# Patient Record
Sex: Female | Born: 1945
Health system: Southern US, Community
[De-identification: ages and names within clinical notes are randomized; demographics above are authoritative.]

## PROBLEM LIST (undated history)

## (undated) DIAGNOSIS — J302 Other seasonal allergic rhinitis: Secondary | ICD-10-CM

## (undated) DIAGNOSIS — F329 Major depressive disorder, single episode, unspecified: Secondary | ICD-10-CM

## (undated) DIAGNOSIS — D649 Anemia, unspecified: Secondary | ICD-10-CM

## (undated) DIAGNOSIS — T4145XA Adverse effect of unspecified anesthetic, initial encounter: Secondary | ICD-10-CM

## (undated) DIAGNOSIS — E042 Nontoxic multinodular goiter: Secondary | ICD-10-CM

## (undated) DIAGNOSIS — I341 Nonrheumatic mitral (valve) prolapse: Secondary | ICD-10-CM

## (undated) DIAGNOSIS — D869 Sarcoidosis, unspecified: Secondary | ICD-10-CM

## (undated) DIAGNOSIS — F32A Depression, unspecified: Secondary | ICD-10-CM

## (undated) DIAGNOSIS — J45909 Unspecified asthma, uncomplicated: Secondary | ICD-10-CM

## (undated) DIAGNOSIS — E7800 Pure hypercholesterolemia, unspecified: Secondary | ICD-10-CM

## (undated) DIAGNOSIS — R0600 Dyspnea, unspecified: Secondary | ICD-10-CM

## (undated) DIAGNOSIS — T8859XA Other complications of anesthesia, initial encounter: Secondary | ICD-10-CM

## (undated) DIAGNOSIS — Z8711 Personal history of peptic ulcer disease: Secondary | ICD-10-CM

## (undated) DIAGNOSIS — I499 Cardiac arrhythmia, unspecified: Secondary | ICD-10-CM

## (undated) HISTORY — PX: OTHER SURGICAL HISTORY: SHX169

## (undated) HISTORY — PX: MITRAL VALVE REPLACEMENT: SHX147

## (undated) HISTORY — DX: Sarcoidosis, unspecified: D86.9

## (undated) HISTORY — DX: Pure hypercholesterolemia, unspecified: E78.00

## (undated) HISTORY — PX: ABDOMINAL HYSTERECTOMY: SHX81

## (undated) HISTORY — PX: CHOLECYSTECTOMY: SHX55

---
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
## 1971-02-02 HISTORY — PX: OTHER SURGICAL HISTORY: SHX169

## 2009-12-11 ENCOUNTER — Ambulatory Visit (HOSPITAL_COMMUNITY)
Admission: RE | Admit: 2009-12-11 | Discharge: 2009-12-11 | Payer: Self-pay | Source: Home / Self Care | Admitting: Family Medicine

## 2010-03-20 ENCOUNTER — Other Ambulatory Visit (HOSPITAL_COMMUNITY): Payer: Self-pay | Admitting: Family Medicine

## 2010-03-20 DIAGNOSIS — R52 Pain, unspecified: Secondary | ICD-10-CM

## 2010-03-25 ENCOUNTER — Encounter (HOSPITAL_COMMUNITY): Payer: Self-pay

## 2010-04-01 ENCOUNTER — Other Ambulatory Visit (HOSPITAL_COMMUNITY): Payer: Self-pay | Admitting: Family Medicine

## 2010-04-01 ENCOUNTER — Ambulatory Visit (HOSPITAL_COMMUNITY)
Admission: RE | Admit: 2010-04-01 | Discharge: 2010-04-01 | Disposition: A | Discharge status: Home / Self Care | Payer: Self-pay | Source: Ambulatory Visit | Attending: Family Medicine | Admitting: Family Medicine

## 2010-04-01 DIAGNOSIS — R52 Pain, unspecified: Secondary | ICD-10-CM

## 2010-04-01 DIAGNOSIS — N644 Mastodynia: Secondary | ICD-10-CM | POA: Insufficient documentation

## 2010-04-01 DIAGNOSIS — N6009 Solitary cyst of unspecified breast: Secondary | ICD-10-CM | POA: Insufficient documentation

## 2010-04-01 DIAGNOSIS — D1739 Benign lipomatous neoplasm of skin and subcutaneous tissue of other sites: Secondary | ICD-10-CM | POA: Insufficient documentation

## 2011-03-25 ENCOUNTER — Other Ambulatory Visit (HOSPITAL_COMMUNITY): Payer: Self-pay | Admitting: Family Medicine

## 2011-03-25 DIAGNOSIS — Z139 Encounter for screening, unspecified: Secondary | ICD-10-CM

## 2011-04-06 ENCOUNTER — Ambulatory Visit (HOSPITAL_COMMUNITY)
Admission: RE | Admit: 2011-04-06 | Discharge: 2011-04-06 | Disposition: A | Discharge status: Home / Self Care | Payer: Medicare Other | Source: Ambulatory Visit | Attending: Family Medicine | Admitting: Family Medicine

## 2011-04-06 DIAGNOSIS — Z1231 Encounter for screening mammogram for malignant neoplasm of breast: Secondary | ICD-10-CM | POA: Insufficient documentation

## 2011-04-06 DIAGNOSIS — Z139 Encounter for screening, unspecified: Secondary | ICD-10-CM

## 2011-07-15 ENCOUNTER — Other Ambulatory Visit (HOSPITAL_COMMUNITY): Payer: Self-pay | Admitting: Family Medicine

## 2011-07-15 DIAGNOSIS — E041 Nontoxic single thyroid nodule: Secondary | ICD-10-CM

## 2011-07-21 ENCOUNTER — Ambulatory Visit (HOSPITAL_COMMUNITY)
Admission: RE | Admit: 2011-07-21 | Discharge: 2011-07-21 | Disposition: A | Discharge status: Home / Self Care | Payer: Medicare Other | Source: Ambulatory Visit | Attending: Family Medicine | Admitting: Family Medicine

## 2011-07-21 DIAGNOSIS — E049 Nontoxic goiter, unspecified: Secondary | ICD-10-CM | POA: Insufficient documentation

## 2011-07-21 DIAGNOSIS — E041 Nontoxic single thyroid nodule: Secondary | ICD-10-CM

## 2011-08-10 ENCOUNTER — Other Ambulatory Visit (HOSPITAL_COMMUNITY): Payer: Self-pay | Admitting: Family Medicine

## 2011-08-10 DIAGNOSIS — D449 Neoplasm of uncertain behavior of unspecified endocrine gland: Secondary | ICD-10-CM

## 2011-08-12 ENCOUNTER — Ambulatory Visit (HOSPITAL_COMMUNITY)
Admission: RE | Admit: 2011-08-12 | Discharge: 2011-08-12 | Disposition: A | Discharge status: Home / Self Care | Payer: Medicare Other | Source: Ambulatory Visit | Attending: Family Medicine | Admitting: Family Medicine

## 2011-08-12 ENCOUNTER — Other Ambulatory Visit (HOSPITAL_COMMUNITY): Payer: Self-pay | Admitting: Family Medicine

## 2011-08-12 ENCOUNTER — Inpatient Hospital Stay (HOSPITAL_COMMUNITY): Admission: RE | Admit: 2011-08-12 | Discharge: 2011-08-12 | Payer: Medicare Other | Source: Ambulatory Visit

## 2011-08-12 DIAGNOSIS — D449 Neoplasm of uncertain behavior of unspecified endocrine gland: Secondary | ICD-10-CM

## 2011-08-12 DIAGNOSIS — E049 Nontoxic goiter, unspecified: Secondary | ICD-10-CM | POA: Insufficient documentation

## 2011-08-12 NOTE — Progress Notes (Signed)
Pt denies taking any anticoagulants.

## 2011-08-12 NOTE — Procedures (Signed)
PreOperative Dx: LEFT thyroid nodule Postoperative Dx: LEFT thyroid nodule Procedure:   US guided FNA LEFT thyroid nodule Radiologist:  Tyron Russell Anesthesia:  1.5 ml of 2% lidocaine Specimen:  FNA x four EBL:   < 1 ml Complications: None

## 2011-08-12 NOTE — Progress Notes (Signed)
1135 Procedure Left Thyroid Biopsy started.  Lidocaine 2%  2 cc injected into left neck (thyroid).  Four biopsies obtained. Tolerated procedure well.  Dressing applied to left thyroid area with pressure applied.

## 2012-01-13 ENCOUNTER — Ambulatory Visit: Payer: Medicare Other | Admitting: Urgent Care

## 2012-01-19 ENCOUNTER — Ambulatory Visit: Payer: Medicare Other | Admitting: Urgent Care

## 2012-05-29 ENCOUNTER — Other Ambulatory Visit (HOSPITAL_COMMUNITY): Payer: Self-pay | Admitting: Family Medicine

## 2012-05-29 DIAGNOSIS — Z139 Encounter for screening, unspecified: Secondary | ICD-10-CM

## 2012-06-01 ENCOUNTER — Ambulatory Visit (HOSPITAL_COMMUNITY)
Admission: RE | Admit: 2012-06-01 | Discharge: 2012-06-01 | Disposition: A | Discharge status: Home / Self Care | Payer: Medicare Other | Source: Ambulatory Visit | Attending: Family Medicine | Admitting: Family Medicine

## 2012-06-01 DIAGNOSIS — Z1231 Encounter for screening mammogram for malignant neoplasm of breast: Secondary | ICD-10-CM | POA: Insufficient documentation

## 2012-06-01 DIAGNOSIS — Z139 Encounter for screening, unspecified: Secondary | ICD-10-CM

## 2013-12-04 ENCOUNTER — Other Ambulatory Visit (HOSPITAL_COMMUNITY): Payer: Self-pay | Admitting: Family

## 2013-12-04 DIAGNOSIS — Z1231 Encounter for screening mammogram for malignant neoplasm of breast: Secondary | ICD-10-CM

## 2013-12-12 ENCOUNTER — Ambulatory Visit (HOSPITAL_COMMUNITY)
Admission: RE | Admit: 2013-12-12 | Discharge: 2013-12-12 | Disposition: A | Discharge status: Home / Self Care | Payer: Medicare Other | Source: Ambulatory Visit | Attending: Family | Admitting: Family

## 2013-12-12 DIAGNOSIS — Z1231 Encounter for screening mammogram for malignant neoplasm of breast: Secondary | ICD-10-CM | POA: Diagnosis not present

## 2014-06-06 DIAGNOSIS — D1802 Hemangioma of intracranial structures: Secondary | ICD-10-CM | POA: Insufficient documentation

## 2014-06-06 DIAGNOSIS — R519 Headache, unspecified: Secondary | ICD-10-CM

## 2014-06-06 HISTORY — DX: Headache, unspecified: R51.9

## 2015-06-12 ENCOUNTER — Other Ambulatory Visit (HOSPITAL_COMMUNITY): Payer: Self-pay | Admitting: Family

## 2015-06-12 DIAGNOSIS — E041 Nontoxic single thyroid nodule: Secondary | ICD-10-CM

## 2015-06-17 ENCOUNTER — Ambulatory Visit (HOSPITAL_COMMUNITY)
Admission: RE | Admit: 2015-06-17 | Discharge: 2015-06-17 | Disposition: A | Discharge status: Home / Self Care | Payer: Medicare Other | Source: Ambulatory Visit | Attending: Family | Admitting: Family

## 2015-06-17 DIAGNOSIS — E041 Nontoxic single thyroid nodule: Secondary | ICD-10-CM

## 2015-06-19 ENCOUNTER — Other Ambulatory Visit (HOSPITAL_COMMUNITY): Payer: Self-pay | Admitting: Family

## 2015-06-19 DIAGNOSIS — E041 Nontoxic single thyroid nodule: Secondary | ICD-10-CM

## 2015-06-25 ENCOUNTER — Ambulatory Visit (HOSPITAL_COMMUNITY)
Admission: RE | Admit: 2015-06-25 | Discharge: 2015-06-25 | Disposition: A | Discharge status: Home / Self Care | Payer: Medicare Other | Source: Ambulatory Visit | Attending: Family | Admitting: Family

## 2015-06-25 DIAGNOSIS — E041 Nontoxic single thyroid nodule: Secondary | ICD-10-CM | POA: Insufficient documentation

## 2015-06-25 MED ORDER — LIDOCAINE HCL (PF) 2 % IJ SOLN
INTRAMUSCULAR | Status: AC
  Administered 2015-06-25: 10 mL
  Filled 2015-06-25: qty 10

## 2015-06-25 NOTE — Discharge Instructions (Signed)

## 2015-11-17 ENCOUNTER — Telehealth: Payer: Self-pay

## 2015-11-17 NOTE — Telephone Encounter (Signed)
Gastroenterology Pre-Procedure Review  Request Date: Requesting Physician:   PATIENT REVIEW QUESTIONS: The patient responded to the following health history questions as indicated:    1. Diabetes Melitis: NO 2. Joint replacements in the past 12 months: NO 3. Major health problems in the past 3 months: NO 4. Has an artificial valve or MVP: NO 5. Has a defibrillator: NO 6. Has been advised in past to take antibiotics in advance of a procedure like teeth cleaning: NO 7. Family history of colon cancer: YES ,UNCLE  57. Alcohol Use: NO 9. History of sleep apnea: NO 10. History of coronary artery or other vascular stents placed within the last 12 months: NO    MEDICATIONS & ALLERGIES:    Patient reports the following regarding taking any blood thinners:   Plavix? NO Aspirin? NO Coumadin? NO Brilinta? NO Xarelto? NO Eliquis? NO Pradaxa? NO Savaysa? NO Effient? NO  Patient confirms/reports the following medications:  Current Outpatient Prescriptions  Medication Sig Dispense Refill  . ranitidine (ZANTAC) 150 MG capsule Take 150 mg by mouth 2 (two) times daily.    . traMADol (ULTRAM) 50 MG tablet Take 50 mg by mouth 3 (three) times daily.     No current facility-administered medications for this visit.     Patient confirms/reports the following allergies:  No Known Allergies  No orders of the defined types were placed in this encounter.   AUTHORIZATION INFORMATION Primary Insurance: Medicare  ID #[id number],  Group #:  Pre-Cert / Auth required:  Pre-Cert / Auth #:   Secondary Insurance: Medicaid,  ID #: [id number],  Group #:  Pre-Cert / Auth required:  Pre-Cert / Auth #:   SCHEDULE INFORMATION: Procedure has been scheduled as follows:  Date: , Time:   Location:   This Gastroenterology Pre-Precedure Review Form is being routed to the following provider(s):

## 2015-11-18 ENCOUNTER — Other Ambulatory Visit: Payer: Self-pay

## 2015-11-18 DIAGNOSIS — Z1211 Encounter for screening for malignant neoplasm of colon: Secondary | ICD-10-CM

## 2015-11-18 MED ORDER — PEG 3350-KCL-NA BICARB-NACL 420 G PO SOLR: 4000.0000 mL | ORAL | 0 refills | Status: DC

## 2015-11-18 NOTE — Telephone Encounter (Signed)
No PA is needed

## 2015-11-18 NOTE — Telephone Encounter (Signed)
Forwarding to Ginger, who triaged pt.

## 2015-11-18 NOTE — Telephone Encounter (Signed)
Ok to schedule.

## 2015-11-18 NOTE — Telephone Encounter (Signed)
Pt is set up for 11/27/15@11 :45 am. Instructions are in the mail. She is aware

## 2015-11-18 NOTE — Telephone Encounter (Signed)
LMOM

## 2015-11-24 ENCOUNTER — Telehealth: Payer: Self-pay | Admitting: Internal Medicine

## 2015-11-24 NOTE — Telephone Encounter (Signed)
I talked with her and she said that she was going to have to wait and she will call us back

## 2015-11-24 NOTE — Telephone Encounter (Signed)
Pt said that she is unable to get a ride to and from the hospital for her procedure and needs to cancel for Thursday.

## 2015-11-27 ENCOUNTER — Ambulatory Visit (HOSPITAL_COMMUNITY): Admission: RE | Admit: 2015-11-27 | Payer: Medicare Other | Source: Ambulatory Visit | Admitting: Internal Medicine

## 2015-11-27 ENCOUNTER — Encounter (HOSPITAL_COMMUNITY): Admission: RE | Payer: Self-pay | Source: Ambulatory Visit

## 2015-11-27 SURGERY — COLONOSCOPY
Anesthesia: Moderate Sedation

## 2015-12-01 ENCOUNTER — Telehealth: Payer: Self-pay

## 2015-12-01 NOTE — Telephone Encounter (Signed)
Amber Bird, please call the patient and let her know she will need to clear that with Endoscopy and she can call [phone] and ask for [facility].

## 2015-12-01 NOTE — Telephone Encounter (Signed)
Pt called and is upset because she had to cancel TCS that was scheduled for 11/27/15 because she didn't have a driver. The only person who could take her is a Industrial/product designer, however she is afraid the neighbor may cancel because of her children. Advised pt that the hospital wouldn't do the procedure if she doesn't have a driver. She asked if she could give the hospital her keys and stay for a while if they would do it. Advised pt they couldn't do that. She is upset because her health is involved. Asked pt if she had someone that could ride with her if she got a transportation service to take her but she said she didn't have anyone. Advised pt I would forward message to office manager. Return phone number is [phone].

## 2015-12-02 ENCOUNTER — Telehealth: Payer: Self-pay | Admitting: Internal Medicine

## 2015-12-02 ENCOUNTER — Other Ambulatory Visit: Payer: Self-pay

## 2015-12-02 DIAGNOSIS — Z1211 Encounter for screening for malignant neoplasm of colon: Secondary | ICD-10-CM

## 2015-12-02 NOTE — Telephone Encounter (Signed)
Called pt and set-up TCS for 12/18/15 at 10:30 am. She already has her prep and hasn't mixed it up. New instructions mailed.

## 2015-12-02 NOTE — Telephone Encounter (Signed)
Pt called office and said that she had spoke to [place] (in Endo) about having Colonoscopy done and she does not have anyone to drive her to the hospital. Stated that Anitra told her she could have procedure done without any sedation. Pt thinks she can handle it and is willing to do it. She is concerned about her health. Pt was triaged for TCS. Forwarding message to LSL.

## 2015-12-02 NOTE — Telephone Encounter (Signed)
Patient would like to reschedule her tcs, however she would like to have a morning appointment.  Please call her first prior to scheduling at [phone]

## 2015-12-02 NOTE — Telephone Encounter (Signed)
Pt is aware.  

## 2015-12-02 NOTE — Telephone Encounter (Signed)
This is a better question for Dr. Gala Romney. We do not usually schedule without sedation, and this is a patient we have not seen and I would not be able to make the call as to whether I thought she could handle it.

## 2015-12-02 NOTE — Telephone Encounter (Signed)
I spoke with the patient and she said she was going to ask her neighbor if she would take her for her appointment and bring her back home.

## 2015-12-02 NOTE — Telephone Encounter (Signed)
Anitra from Endo called to say that the patient had called over there because we advised her to call Threasa Beards regarding her transportation issues. Threasa Beards is out and patient spoke with Anitra. Anitra said that the only way they would do her colonoscopy without her having a driver is if she didn't have any medications during the procedure. She would have to remain awake during the colonoscopy without any medication. Patient was told to call our office back.

## 2015-12-02 NOTE — Telephone Encounter (Signed)
Forwarding to Dr. Gala Romney.

## 2015-12-02 NOTE — Telephone Encounter (Signed)
Not only can she not drive for 24 hours, she is supposed to have someone with her the evening following the procedure.  It is my policy to place an IV and start out with no sedation in such patients, however, if she becomes intolerant of the procedure, IV sedation would need to be given.  So, there needs to be a backup plan.

## 2015-12-02 NOTE — Telephone Encounter (Signed)
Patient stated she will have her neighbor take her for her procedure.

## 2015-12-03 NOTE — Telephone Encounter (Signed)
noted 

## 2015-12-11 ENCOUNTER — Telehealth: Payer: Self-pay

## 2015-12-11 NOTE — Telephone Encounter (Signed)
Pt is calling because she in not having a bowel movement and she is wanting to know if there is something she can do. Her last good bowel movement was 4 days ago. She is eating apples twice a day but the is not helping.

## 2015-12-11 NOTE — Telephone Encounter (Signed)
Pt is aware.  

## 2015-12-11 NOTE — Telephone Encounter (Signed)
She can try MiraLAX  for now.

## 2015-12-15 ENCOUNTER — Telehealth: Payer: Self-pay

## 2015-12-15 NOTE — Telephone Encounter (Signed)
Pt called office and was concerned that she took 2 Dulcolax last week and it took 21 hours for her to have a BM and she is scheduled for a colonoscopy 12/18/15. Advised pt that she would start drinking the prep 2 hours after taking the Dulcolax. Advised pt she could do clear liquids all day tomorrow if she would like.

## 2015-12-18 ENCOUNTER — Encounter (HOSPITAL_COMMUNITY)
Admission: RE | Disposition: A | Discharge status: Home Health | Payer: Self-pay | Source: Ambulatory Visit | Attending: Internal Medicine

## 2015-12-18 ENCOUNTER — Encounter (HOSPITAL_COMMUNITY): Payer: Self-pay | Admitting: *Deleted

## 2015-12-18 ENCOUNTER — Ambulatory Visit (HOSPITAL_COMMUNITY)
Admission: RE | Admit: 2015-12-18 | Discharge: 2015-12-18 | Disposition: A | Discharge status: Home Health | Payer: Medicare Other | Source: Ambulatory Visit | Attending: Internal Medicine | Admitting: Internal Medicine

## 2015-12-18 ENCOUNTER — Telehealth: Payer: Self-pay | Admitting: Internal Medicine

## 2015-12-18 DIAGNOSIS — Q438 Other specified congenital malformations of intestine: Secondary | ICD-10-CM | POA: Diagnosis not present

## 2015-12-18 DIAGNOSIS — Z79899 Other long term (current) drug therapy: Secondary | ICD-10-CM | POA: Insufficient documentation

## 2015-12-18 DIAGNOSIS — Z1211 Encounter for screening for malignant neoplasm of colon: Secondary | ICD-10-CM | POA: Diagnosis present

## 2015-12-18 DIAGNOSIS — Z1212 Encounter for screening for malignant neoplasm of rectum: Secondary | ICD-10-CM | POA: Diagnosis not present

## 2015-12-18 DIAGNOSIS — K5730 Diverticulosis of large intestine without perforation or abscess without bleeding: Secondary | ICD-10-CM | POA: Diagnosis not present

## 2015-12-18 HISTORY — PX: COLONOSCOPY: SHX5424

## 2015-12-18 HISTORY — DX: Personal history of peptic ulcer disease: Z87.11

## 2015-12-18 HISTORY — DX: Other seasonal allergic rhinitis: J30.2

## 2015-12-18 HISTORY — DX: Nontoxic multinodular goiter: E04.2

## 2015-12-18 SURGERY — COLONOSCOPY
Anesthesia: Moderate Sedation

## 2015-12-18 MED ORDER — MIDAZOLAM HCL 5 MG/5ML IJ SOLN
INTRAMUSCULAR | Status: DC | PRN
  Administered 2015-12-18 (×2): 1 mg via INTRAVENOUS
  Administered 2015-12-18 (×2): 2 mg via INTRAVENOUS

## 2015-12-18 MED ORDER — MIDAZOLAM HCL 5 MG/5ML IJ SOLN
INTRAMUSCULAR | Status: AC
  Filled 2015-12-18: qty 10

## 2015-12-18 MED ORDER — MEPERIDINE HCL 100 MG/ML IJ SOLN
INTRAMUSCULAR | Status: AC
  Filled 2015-12-18: qty 2

## 2015-12-18 MED ORDER — ONDANSETRON HCL 4 MG/2ML IJ SOLN
INTRAMUSCULAR | Status: DC | PRN
  Administered 2015-12-18: 4 mg via INTRAVENOUS

## 2015-12-18 MED ORDER — MEPERIDINE HCL 100 MG/ML IJ SOLN
INTRAMUSCULAR | Status: DC | PRN
  Administered 2015-12-18 (×2): 50 mg via INTRAVENOUS

## 2015-12-18 MED ORDER — STERILE WATER FOR IRRIGATION IR SOLN
Status: DC | PRN
  Administered 2015-12-18: 10:00:00

## 2015-12-18 MED ORDER — ONDANSETRON HCL 4 MG/2ML IJ SOLN
INTRAMUSCULAR | Status: AC
  Filled 2015-12-18: qty 2

## 2015-12-18 MED ORDER — SODIUM CHLORIDE 0.9 % IV SOLN
INTRAVENOUS | Status: DC
  Administered 2015-12-18: 10:00:00 via INTRAVENOUS

## 2015-12-18 NOTE — Telephone Encounter (Signed)
There was transient suggestion of ventricular ectopy on the monitor only. No tracing documented. It rapidly cleared up. This was likely interference or artifact - not real  She does not need to do anything.

## 2015-12-18 NOTE — Telephone Encounter (Signed)
[id number] PATIENT CALLED AND STATED THAT RIGHT BEFORE SHE WAS PUT UNDER ANESTESIA SHE WAS TOLD THAT SHE HAS SOMETHING OFF WITH HER HEARTBEAT, IS THIS SOMETHING THAT SHE NEEDS TO ADDRESS WITH HER PCP, CAN NOT REMEMBER EXACTLY WHAT WAS TOLD TO HER, PLEASE CALL

## 2015-12-18 NOTE — H&P (Signed)
@  [id number]   Primary Care Physician:  Vidal Schwalbe, MD Primary Gastroenterologist:  Dr. Gala Romney  Pre-Procedure History & Physical: HPI:  ABHA RACANELLI is a 70 y.o. female is here for a screening colonoscopy. Occasional abdominal bloating. No family history of colon cancer in first-degree relatives.  Past Medical History:  Diagnosis Date  . History of bleeding ulcers   . Multiple thyroid nodules   . Seasonal allergies     Past Surgical History:  Procedure Laterality Date  . ABDOMINAL HYSTERECTOMY    . Cyst removed from ovary  1973  . left hammer toe repair    . thyroid needle biopsy      Prior to Admission medications   Medication Sig Start Date End Date Taking? Authorizing Provider  cetirizine (ZYRTEC) 10 MG chewable tablet Chew 10 mg by mouth daily.   Yes Historical Provider, MD  docusate sodium (COLACE) 100 MG capsule Take 100-200 mg by mouth daily. 200mg  in the AM and 100mg  in the evening   Yes Historical Provider, MD  FIBER PO Take 1 tablet by mouth daily.   Yes Historical Provider, MD    Allergies as of 12/02/2015  . (No Known Allergies)    Family History  Problem Relation Age of Onset  . Colon cancer Other     Social History   Social History  . Marital status: Divorced    Spouse name: N/A  . Number of children: N/A  . Years of education: N/A   Occupational History  . Not on file.   Social History Main Topics  . Smoking status: Never Smoker  . Smokeless tobacco: Never Used  . Alcohol use No  . Drug use: No  . Sexual activity: Not on file   Other Topics Concern  . Not on file   Social History Narrative  . No narrative on file    Review of Systems: See HPI, otherwise negative ROS  Physical Exam: BP 123/74   Pulse 82   Temp 97.7 F (36.5 C) (Oral)   Resp (!) 22   Ht 5\' 5"  (1.651 m)   Wt 150 lb (68 kg)   SpO2 98%   BMI 24.96 kg/m  General:   Alert,  Well-developed, well-nourished, pleasant and cooperative in NAD Mouth:  No deformity or  lesions, dentition normal. Neck:  Supple; no masses or thyromegaly. Lungs:  Clear throughout to auscultation.   No wheezes, crackles, or rhonchi. No acute distress. Heart:  Regular rate and rhythm; no murmurs, clicks, rubs,  or gallops. Abdomen:  Soft, nontender and nondistended. No masses, hepatosplenomegaly or hernias noted. Normal bowel sounds, without guarding, and without rebound.      Impression/Plan: DONDRA SHIVERDECKER is now here to undergo a screening colonoscopy.  Average risk screening examination.  Risks, benefits, limitations, imponderables and alternatives regarding colonoscopy have been reviewed with the patient. Questions have been answered. All parties agreeable.         Notice:  This dictation was prepared with Dragon dictation along with smaller phrase technology. Any transcriptional errors that result from this process are unintentional and may not be corrected upon review.

## 2015-12-18 NOTE — Op Note (Signed)
[facility] Patient Name: Amber Bird Procedure Date: 12/18/2015 9:50 AM MRN: [id number] Date of Birth: 1945/11/15 Attending MD: Norvel Richards , MD CSN: [id number] Age: 70 Admit Type: Outpatient Procedure:                Colonoscopy Indications:              Screening for colorectal malignant neoplasm Providers:                Norvel Richards, MD, Janeece Riggers, RN, Sherlyn Lees, Technician Referring MD:              Medicines:                Midazolam 6 mg IV, Meperidine 100 mg IV,                            Ondansetron 4 mg IV Complications:            No immediate complications. Estimated Blood Loss:     Estimated blood loss: none. Procedure:                Pre-Anesthesia Assessment:                           - Prior to the procedure, a History and Physical                            was performed, and patient medications and                            allergies were reviewed. The patient's tolerance of                            previous anesthesia was also reviewed. The risks                            and benefits of the procedure and the sedation                            options and risks were discussed with the patient.                            All questions were answered, and informed consent                            was obtained. Prior Anticoagulants: The patient has                            taken no previous anticoagulant or antiplatelet                            agents. ASA Grade Assessment: II - A patient with  mild systemic disease. After reviewing the risks                            and benefits, the patient was deemed in                            satisfactory condition to undergo the procedure.                           After obtaining informed consent, the colonoscope                            was passed under direct vision. Throughout the                            procedure, the  patient's blood pressure, pulse, and                            oxygen saturations were monitored continuously. The                            EC-[id number]) scope was introduced through                            the anus and advanced to the the cecum, identified                            by appendiceal orifice and ileocecal valve. The                            colonoscopy was performed without difficulty. The                            patient tolerated the procedure well. The entire                            colon was well visualized. The ileocecal valve,                            appendiceal orifice, and rectum were photographed.                            The quality of the bowel preparation was adequate. Scope In: 10:03:11 AM Scope Out: 10:25:07 AM Scope Withdrawal Time: 0 hours 8 minutes 0 seconds  Total Procedure Duration: 0 hours 21 minutes 56 seconds  Findings:      The perianal and digital rectal examinations were normal.      A few small and large-mouthed diverticula were found in the sigmoid       colon. Rectal vault small. Unable to retroflex. Rectal mucosa seen well       on?"face.      The exam was otherwise without abnormality. Redundant colon. Impression:               - Diverticulosis in the sigmoid colon.                           -  The examination was otherwise normal. redundant                            colon.                           - No specimens collected. Moderate Sedation:      Moderate (conscious) sedation was administered by the endoscopy nurse       and supervised by the endoscopist. The following parameters were       monitored: oxygen saturation, heart rate, blood pressure, respiratory       rate, EKG, adequacy of pulmonary ventilation, and response to care.       Total physician intraservice time was 30 minutes. Recommendation:           - Patient has a contact number available for                            emergencies. The signs and  symptoms of potential                            delayed complications were discussed with the                            patient. Return to normal activities tomorrow.                            Written discharge instructions were provided to the                            patient.                           - Resume previous diet.                           - Continue present medications.                           - No repeat colonoscopy due to age. Begin Benefiber                            1 tablespoon twice daily.Begin probiotic or                            Digestive advantage or Align daily for occasional                            bloating symptoms patient xperiencing.                           - Return to GI office in 6 weeks. Procedure Code(s):        --- Professional ---                           [phone], Colonoscopy, flexible; diagnostic, including  collection of specimen(s) by brushing or washing,                            when performed (separate procedure)                           99152, Moderate sedation services provided by the                            same physician or other qualified health care                            professional performing the diagnostic or                            therapeutic service that the sedation supports,                            requiring the presence of an independent trained                            observer to assist in the monitoring of the                            patient's level of consciousness and physiological                            status; initial 15 minutes of intraservice time,                            patient age 37 years or older                           [phone], Moderate sedation services; each additional                            15 minutes intraservice time Diagnosis Code(s):        --- Professional ---                           Z12.11, Encounter for screening for malignant                             neoplasm of colon                           K57.30, Diverticulosis of large intestine without                            perforation or abscess without bleeding CPT copyright 2016 American Medical Association. All rights reserved. The codes documented in this report are preliminary and upon coder review may  be revised to meet current compliance requirements. Cristopher Estimable. Amana Bouska, MD Norvel Richards, MD 12/18/2015 10:32:48 AM This report has been signed electronically. Number of Addenda: 0

## 2015-12-18 NOTE — Discharge Instructions (Addendum)
Colonoscopy Discharge Instructions  Read the instructions outlined below and refer to this sheet in the next few weeks. These discharge instructions provide you with general information on caring for yourself after you leave the hospital. Your doctor may also give you specific instructions. While your treatment has been planned according to the most current medical practices available, unavoidable complications occasionally occur. If you have any problems or questions after discharge, call Dr. Gala Romney at [phone]. ACTIVITY  You may resume your regular activity, but move at a slower pace for the next 24 hours.   Take frequent rest periods for the next 24 hours.   Walking will help get rid of the air and reduce the bloated feeling in your belly (abdomen).   No driving for 24 hours (because of the medicine (anesthesia) used during the test).    Do not sign any important legal documents or operate any machinery for 24 hours (because of the anesthesia used during the test).  NUTRITION  Drink plenty of fluids.   You may resume your normal diet as instructed by your doctor.   Begin with a light meal and progress to your normal diet. Heavy or fried foods are harder to digest and may make you feel sick to your stomach (nauseated).   Avoid alcoholic beverages for 24 hours or as instructed.  MEDICATIONS  You may resume your normal medications unless your doctor tells you otherwise.  WHAT YOU CAN EXPECT TODAY  Some feelings of bloating in the abdomen.   Passage of more gas than usual.   Spotting of blood in your stool or on the toilet paper.  IF YOU HAD POLYPS REMOVED DURING THE COLONOSCOPY:  No aspirin products for 7 days or as instructed.   No alcohol for 7 days or as instructed.   Eat a soft diet for the next 24 hours.  FINDING OUT THE RESULTS OF YOUR TEST Not all test results are available during your visit. If your test results are not back during the visit, make an appointment  with your caregiver to find out the results. Do not assume everything is normal if you have not heard from your caregiver or the medical facility. It is important for you to follow up on all of your test results.  SEEK IMMEDIATE MEDICAL ATTENTION IF:  You have more than a spotting of blood in your stool.   Your belly is swollen (abdominal distention).   You are nauseated or vomiting.   You have a temperature over 101.   You have abdominal pain or discomfort that is severe or gets worse throughout the day.    Colonic diverticulosis information provided  There was no evidence of polyp or cancer found on today's examination  Take Benefiber 1 tablespoon twice daily  Take a probiotic daily such as digestive advantage for gas bloat or [place] visit with [place] in 4-6 weeks.   Diverticulosis Diverticulosis is the condition that develops when small pouches (diverticula) form in the wall of your colon. Your colon, or large intestine, is where water is absorbed and stool is formed. The pouches form when the inside layer of your colon pushes through weak spots in the outer layers of your colon. CAUSES  No one knows exactly what causes diverticulosis. RISK FACTORS  Being older than 7. Your risk for this condition increases with age. Diverticulosis is rare in people younger than 40 years. By age 52, almost everyone has it.  Eating a low-fiber diet.  Being frequently constipated.  Being overweight.  Not getting enough exercise.  Smoking.  Taking over-the-counter pain medicines, like aspirin and ibuprofen. SYMPTOMS  Most people with diverticulosis do not have symptoms. DIAGNOSIS  Because diverticulosis often has no symptoms, health care providers often discover the condition during an exam for other colon problems. In many cases, a health care provider will diagnose diverticulosis while using a flexible scope to examine the colon (colonoscopy). TREATMENT  If you have never  developed an infection related to diverticulosis, you may not need treatment. If you have had an infection before, treatment may include:  Eating more fruits, vegetables, and grains.  Taking a fiber supplement.  Taking a live bacteria supplement (probiotic).  Taking medicine to relax your colon. HOME CARE INSTRUCTIONS   Drink at least 6-8 glasses of water each day to prevent constipation.  Try not to strain when you have a bowel movement.  Keep all follow-up appointments. If you have had an infection before:  Increase the fiber in your diet as directed by your health care provider or dietitian.  Take a dietary fiber supplement if your health care provider approves.  Only take medicines as directed by your health care provider. SEEK MEDICAL CARE IF:   You have abdominal pain.  You have bloating.  You have cramps.  You have not gone to the bathroom in 3 days. SEEK IMMEDIATE MEDICAL CARE IF:   Your pain gets worse.  Yourbloating becomes very bad.  You have a fever or chills, and your symptoms suddenly get worse.  You begin vomiting.  You have bowel movements that are bloody or black. MAKE SURE YOU:  Understand these instructions.  Will watch your condition.  Will get help right away if you are not doing well or get worse. This information is not intended to replace advice given to you by your health care provider. Make sure you discuss any questions you have with your health care provider.

## 2015-12-18 NOTE — Telephone Encounter (Signed)
Dr.Rourk, I checked her discharge paperwork and her procedure note and didn't see anything about her heartbeat. Do you know anything about this?

## 2015-12-19 ENCOUNTER — Encounter (HOSPITAL_COMMUNITY): Payer: Self-pay | Admitting: Internal Medicine

## 2015-12-19 NOTE — Telephone Encounter (Signed)
Pt is aware.  

## 2016-01-15 ENCOUNTER — Ambulatory Visit (INDEPENDENT_AMBULATORY_CARE_PROVIDER_SITE_OTHER): Payer: Medicare Other | Admitting: Gastroenterology

## 2016-01-15 ENCOUNTER — Encounter: Payer: Self-pay | Admitting: Gastroenterology

## 2016-01-15 VITALS — BP 116/77 | HR 81 | Temp 98.0°F | Ht 65.0 in | Wt 155.6 lb

## 2016-01-15 DIAGNOSIS — R1030 Lower abdominal pain, unspecified: Secondary | ICD-10-CM | POA: Diagnosis not present

## 2016-01-15 NOTE — Patient Instructions (Signed)
We have scheduled you for a CT scan in the near future.  Further recommendations to follow!

## 2016-01-15 NOTE — Progress Notes (Signed)
    Referring Provider: Vidal Schwalbe, MD Primary Care Physician:  Vidal Schwalbe, MD Primary GI: [facility]    Chief Complaint  Patient presents with  . Constipation    HPI:   Amber Bird is a 70 y.o. female presenting today with a history of constipation, recently completing colonoscopy.  Taking probiotic, stool softener, benefiber. Constipation improved. Prior to the colonoscopy was worried about cancer. Lower abdominal pain noted, intermittent, no rhyme or reason for it. Sometimes precedes having a BM sometimes not. Pain has continued despite bowel regimen. Denies constipation.   No weight loss. Appetite is good. Feels bloated around abdomen. Worried she may have a malignancy. Drinks a lot of water. Sometimes has a feeling of fullness in lower abdomen.   Past Medical History:  Diagnosis Date  . History of bleeding ulcers   . Multiple thyroid nodules   . Seasonal allergies     Past Surgical History:  Procedure Laterality Date  . ABDOMINAL HYSTERECTOMY     complete.   . COLONOSCOPY N/A 12/18/2015   Procedure: COLONOSCOPY;  Surgeon: Daneil Dolin, MD;  Location: [facility];  Service: Endoscopy;  Laterality: N/A;  10:30 am  . Cyst removed from ovary  1973  . left hammer toe repair    . thyroid needle biopsy      Current Outpatient Prescriptions  Medication Sig Dispense Refill  . cetirizine (ZYRTEC) 10 MG chewable tablet Chew 10 mg by mouth daily.    Marland Kitchen docusate sodium (COLACE) 100 MG capsule Take 100-200 mg by mouth daily. 200mg  in the AM and 100mg  in the evening    . FIBER PO Take 1 tablet by mouth daily.    . Probiotic Product (PROBIOTIC DAILY PO) Take by mouth.     No current facility-administered medications for this visit.     Allergies as of 01/15/2016  . (No Known Allergies)    Family History  Problem Relation Age of Onset  . Colon cancer Other     Social History   Social History  . Marital status: Divorced    Spouse name: N/A  . Number of  children: N/A  . Years of education: N/A   Social History Main Topics  . Smoking status: Never Smoker  . Smokeless tobacco: Never Used  . Alcohol use No  . Drug use: No  . Sexual activity: Not Asked   Other Topics Concern  . None   Social History Narrative  . None    Review of Systems: As mentioned in HPI   Physical Exam: BP 116/77   Pulse 81   Temp 98 F (36.7 C) (Oral)   Ht 5\' 5"  (1.651 m)   Wt 155 lb 9.6 oz (70.6 kg)   BMI 25.89 kg/m  General:   Alert and oriented. No distress noted. Pleasant and cooperative.  Head:  Normocephalic and atraumatic. Eyes:  Conjuctiva clear without scleral icterus. Mouth:  Oral mucosa pink and moist. Good dentition. No lesions. Abdomen:  +BS, soft, non-tender and non-distended. No rebound or guarding. No HSM or masses noted. Msk:  Symmetrical without gross deformities. Normal posture Extremities:  Without edema. Neurologic:  Alert and  oriented x4;  grossly normal neurologically. Psych:  Alert and cooperative. Normal mood and affect.

## 2016-01-16 ENCOUNTER — Encounter: Payer: Self-pay | Admitting: Gastroenterology

## 2016-01-18 DIAGNOSIS — R1030 Lower abdominal pain, unspecified: Secondary | ICD-10-CM | POA: Insufficient documentation

## 2016-01-18 NOTE — Assessment & Plan Note (Signed)
70 year old with persistent fullness, lower abdominal discomfort despite more aggressive bowel regimen. Colonoscopy recently completed and without concerning findings. Continue benefiber, probiotic, stool softener, and proceed with CT for further evaluation.

## 2016-01-20 NOTE — Progress Notes (Signed)
cc'ed to pcp °

## 2016-01-27 ENCOUNTER — Ambulatory Visit (HOSPITAL_COMMUNITY): Payer: Medicare Other

## 2016-01-28 ENCOUNTER — Ambulatory Visit (HOSPITAL_COMMUNITY)
Admission: RE | Admit: 2016-01-28 | Discharge: 2016-01-28 | Disposition: A | Discharge status: Home / Self Care | Payer: Medicare Other | Source: Ambulatory Visit | Attending: Gastroenterology | Admitting: Gastroenterology

## 2016-01-28 ENCOUNTER — Encounter (HOSPITAL_COMMUNITY): Payer: Self-pay

## 2016-01-28 DIAGNOSIS — K8020 Calculus of gallbladder without cholecystitis without obstruction: Secondary | ICD-10-CM | POA: Insufficient documentation

## 2016-01-28 DIAGNOSIS — Q638 Other specified congenital malformations of kidney: Secondary | ICD-10-CM | POA: Insufficient documentation

## 2016-01-28 DIAGNOSIS — R1030 Lower abdominal pain, unspecified: Secondary | ICD-10-CM | POA: Diagnosis not present

## 2016-01-28 LAB — POCT I-STAT CREATININE: Creatinine, Ser: 0.9 mg/dL (ref 0.44–1.00)

## 2016-01-28 MED ORDER — IOPAMIDOL (ISOVUE-300) INJECTION 61%
100.0000 mL | Freq: Once | INTRAVENOUS | Status: AC | PRN
  Administered 2016-01-28: 100 mL via INTRAVENOUS

## 2016-01-30 NOTE — Progress Notes (Signed)
Patient has had vague lower abdominal pain centrally located intermittently since 2014 and reports lifelong history of intermittent abdominal pain. 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 Creatinine normal recently. Moderate stool throughout the colon. I contacted patient. Denied chronic UTIs. Denies abdominal pain or urinary symptoms now. Discomfort at baseline and has been present for years. We need to refer to urologist for further evaluation. They may want to just follow with serial ultrasounds. Please refer to Urologist.

## 2016-02-03 ENCOUNTER — Other Ambulatory Visit: Payer: Self-pay

## 2016-02-03 DIAGNOSIS — R1030 Lower abdominal pain, unspecified: Secondary | ICD-10-CM

## 2016-02-04 ENCOUNTER — Telehealth: Payer: Self-pay | Admitting: Internal Medicine

## 2016-02-04 NOTE — Telephone Encounter (Signed)
Patient called stating that she was told my Vicente Males that her bladder was abnormal and was probably something she has had since birth, she had a ct in 2014 that stated her bladder was normal.  She is very concerned.  Please call [phone]

## 2016-02-04 NOTE — Telephone Encounter (Signed)
I spoke with the pt, she said she talked to AB on Friday about her ct. She said she remembered having a ct a few years ago, she called and found out it was in 2014 at [facility] and it was normal. She is going to call them and have them fax [place] a copy of the results and I have given her the fax number. She said she feels a little better since staring the benefiber and probiotic and the constipation doesn't seem to be an issue now, but she still has a small ,constant ,abd pain and she wonders if it is just IBS. She said she can live with the pain as long as she knows it is nothing serious.

## 2016-02-04 NOTE — Telephone Encounter (Signed)
CT report here and given to AB.

## 2016-02-04 NOTE — Telephone Encounter (Signed)
Routing to AB. I told the pt, we would call her back as soon as AB has review the 2014 CT note

## 2016-02-04 NOTE — Telephone Encounter (Signed)
I will look at CT when it comes, but she definitely still needs to see a urologist.

## 2016-02-05 NOTE — Telephone Encounter (Signed)
linzess samples are at the front desk.

## 2016-02-05 NOTE — Telephone Encounter (Signed)
Patient is also dealing with constipation. Hard balls. Please place Linzess 72 mcg samples at the front.

## 2016-02-05 NOTE — Telephone Encounter (Signed)
CT reviewed. Impression states "moderate fecal retention within the colon, without bowel obstruction. Cholelithiasis without acute cholecystitis. No obstructive uropathy and normal appendix". However, in findings was noted "the right kidney is slightly rotated anteriorly".   Discussed with patient. Will still need urology assessment.

## 2016-02-09 ENCOUNTER — Telehealth: Payer: Self-pay

## 2016-02-09 NOTE — Telephone Encounter (Signed)
Called and informed pt of appt at [facility] 02/23/16 at 8:15 am with Dr. Alyson Ingles. [facility] to mail pt a packet.

## 2016-02-23 DIAGNOSIS — N393 Stress incontinence (female) (male): Secondary | ICD-10-CM | POA: Diagnosis not present

## 2016-03-01 ENCOUNTER — Other Ambulatory Visit: Payer: Self-pay | Admitting: Urology

## 2016-03-01 DIAGNOSIS — Q6239 Other obstructive defects of renal pelvis and ureter: Secondary | ICD-10-CM

## 2016-03-01 DIAGNOSIS — Q6211 Congenital occlusion of ureteropelvic junction: Principal | ICD-10-CM

## 2016-03-09 DIAGNOSIS — K8020 Calculus of gallbladder without cholecystitis without obstruction: Secondary | ICD-10-CM | POA: Diagnosis not present

## 2016-03-15 ENCOUNTER — Encounter (HOSPITAL_COMMUNITY): Payer: Self-pay | Admitting: Radiology

## 2016-03-15 ENCOUNTER — Encounter (HOSPITAL_COMMUNITY)
Admission: RE | Admit: 2016-03-15 | Discharge: 2016-03-15 | Disposition: A | Discharge status: Home / Self Care | Payer: Medicare HMO | Source: Ambulatory Visit | Attending: Urology | Admitting: Urology

## 2016-03-15 DIAGNOSIS — Q6211 Congenital occlusion of ureteropelvic junction: Secondary | ICD-10-CM | POA: Diagnosis present

## 2016-03-15 DIAGNOSIS — N130 Hydronephrosis with ureteropelvic junction obstruction: Secondary | ICD-10-CM | POA: Diagnosis not present

## 2016-03-15 DIAGNOSIS — Q6239 Other obstructive defects of renal pelvis and ureter: Secondary | ICD-10-CM

## 2016-03-15 MED ORDER — FUROSEMIDE 10 MG/ML IJ SOLN
35.0000 mg | Freq: Once | INTRAMUSCULAR | Status: AC
  Administered 2016-03-15: 35 mg via INTRAVENOUS

## 2016-03-15 MED ORDER — FUROSEMIDE 10 MG/ML IJ SOLN
INTRAMUSCULAR | Status: AC
  Filled 2016-03-15: qty 4

## 2016-03-15 MED ORDER — TECHNETIUM TC 99M MERTIATIDE
5.0000 | Freq: Once | INTRAVENOUS | Status: AC | PRN
  Administered 2016-03-15: 5 via INTRAVENOUS

## 2016-03-17 ENCOUNTER — Ambulatory Visit (INDEPENDENT_AMBULATORY_CARE_PROVIDER_SITE_OTHER): Payer: Medicare HMO | Admitting: Urology

## 2016-03-17 DIAGNOSIS — Q6211 Congenital occlusion of ureteropelvic junction: Secondary | ICD-10-CM | POA: Diagnosis not present

## 2016-04-05 ENCOUNTER — Ambulatory Visit: Payer: Self-pay | Admitting: Surgery

## 2016-04-05 DIAGNOSIS — K8010 Calculus of gallbladder with chronic cholecystitis without obstruction: Secondary | ICD-10-CM | POA: Diagnosis not present

## 2016-04-05 DIAGNOSIS — R2241 Localized swelling, mass and lump, right lower limb: Secondary | ICD-10-CM | POA: Diagnosis not present

## 2016-04-05 NOTE — H&P (Signed)
Amber Bird 04/05/2016 11:23 AM Location: [facility] Patient #: [id number] DOB: 1945/04/18 Divorced / Language: [name] / Race: White Female  History of Present Illness Amber Hector MD; 04/05/2016 12:26 PM) The patient is a 71 year old female who presents with non-malignant abdominal pain. Note for "Non-malignant abdominal pain": Patient sent for surgical consultation requested of her urologist, Amber Bird. Concern for abdominal pain and gallstones. Question need for possible cholecystectomy.  Pleasant chatty woman who struggled with chronic abdominal pain. It is usually in the lower abdomen. FAmily history of cancer in the abdomen. She was concerned. History of chronic constipation. Followed by Amber Bird. Placed on bowel regimen and Lizness. Underwent colonoscopy a few months ago. No mass or tumor. No colitis or other abnormalities. Bowel regimen changed. He is moving her bowels more regularly now on a regimen of stool softener, probiotics, fiber supplement.  However she's had intermittent attacks of upper abdominal pain. She recalls an intense attack a few years ago when she had a salad. Felt sweaty and had epigastric pain. Most wondered if she had a heart attack. She recalls friends saying that he had gallbladder attacks similar to this. Had a CAT scan that showed stones but no cholecystitis. She's had a few intermittent attacks over the past few years. There is some question of possibly related to indigestion. Patient recalls having some heartburn and reflux issues when sahe was 180lbs. She's lost about 20 pounds intentionally over the past year and HB went gone away. This does not feel like this. She recalls an episode of GI bleeding 40 years ago requiring hospitalization and transfusions. She recalls being told that she had an ulcer. No endoscopy or surgery done. She tends to avoid spicy food.   With these attacks of pain she get another  CAT scan. Some abnormalities in the right renal system. Perhaps some twisting at the ureteral pelvic junction. Urology eval done. Follow up renal scan showed no obstruction mass or concerning symptoms. CAT scan confirmed again she had numerous gallstones. Dilated gallbladder. Was recommended discuss with surgery. She recalls meeting Amber Bird up in [place]. She recall it was felt to be more related to indigestion as the story was not definite. I do not have records. Held off on any surgery. Another attack. Felt frustrated. Wished a second opinion. Her urologist sent her to me. Patient can walk about 3 miles a day. She usually does half mile walks on her. She has some right hip soreness that keeps her from going several miles at a time. She does not smoke. No heart attack or stroke. She had a hysterectomy in 2005. She is originally up from [place] and relocated here. She's not has had a good of a diet but tends to trying focus on a low fat high fiber diet.  No personal nor family history of GI/colon cancer, inflammatory bowel disease, irritable bowel syndrome, allergy such as Celiac Sprue, dietary/dairy problems, colitis, ulcers nor gastritis. No recent sick contacts/gastroenteritis. No travel outside the country. No changes in diet. No dysphagia to solids or liquids. No significant heartburn or reflux. No hematochezia, hematemesis, coffee ground emesis. No evidence of prior gastric/peptic ulceration.  She also felt a little pain on top of her right foot and wondered if she had an insect bite. Has noticed a lump there. No bleeding. No drainage. His fine. Not really painful. Was worried about a DVT. Has not really followed up with any on this.   Past Surgical History (  April Staton, CMA; 04/05/2016 11:23 AM) Foot Surgery Left. Hysterectomy (due to cancer) - Complete Hysterectomy (not due to cancer) - Complete Oral Surgery  Diagnostic Studies History (April  Staton, CMA; 04/05/2016 11:23 AM) Colonoscopy within last year Mammogram within last year Pap Smear >5 years ago  Allergies (April Staton, CMA; 04/05/2016 11:24 AM) No Known Drug Allergies 04/05/2016  Medication History (April Staton, CMA; 04/05/2016 11:24 AM) Cetirizine HCl (10MG  Tablet, Oral as needed) Active. Fiber (Oral) Specific strength unknown - Active. Medications Reconciled  Social History (April Staton, CMA; 04/05/2016 11:23 AM) Alcohol use Occasional alcohol use. Caffeine use Coffee, Tea. No drug use Tobacco use Never smoker.  Family History (April Staton, [place]; 04/05/2016 11:23 AM) Alcohol Abuse Mother. Breast Cancer Mother. Heart Disease Father. Ischemic Bowel Disease Mother. Malignant Neoplasm Of Pancreas Mother. Respiratory Condition Mother.  Pregnancy / Birth History (April Staton, [place]; 04/05/2016 11:23 AM) Age at menarche 66 years. Age of menopause <46 Contraceptive History Oral contraceptives. Gravida 0 Para 0  Other Problems (April Staton, CMA; 04/05/2016 11:23 AM) Asthma Back Pain Chest pain Cholelithiasis Gastric Ulcer Gastroesophageal Reflux Disease Hemorrhoids Hypercholesterolemia Migraine Headache Oophorectomy Bilateral. Thyroid Disease Transfusion history     Review of Systems (April Staton CMA; 04/05/2016 11:23 AM) General Present- Chills and Fatigue. Not Present- Appetite Loss, Fever, Night Sweats, Weight Gain and Weight Loss. Skin Present- Dryness, New Lesions and Ulcer. Not Present- Change in Wart/Mole, Hives, Jaundice, Non-Healing Wounds and Rash. HEENT Present- Seasonal Allergies and Wears glasses/contact lenses. Not Present- Earache, Hearing Loss, Hoarseness, Nose Bleed, Oral Ulcers, Ringing in the Ears, Sinus Pain, Sore Throat, Visual Disturbances and Yellow Eyes. Respiratory Not Present- Bloody sputum, Chronic Cough, Difficulty Breathing, Snoring and Wheezing. Breast Not Present- Breast Mass, Breast Pain,  Nipple Discharge and Skin Changes. Cardiovascular Present- Chest Pain and Shortness of Breath. Not Present- Difficulty Breathing Lying Down, Leg Cramps, Palpitations, Rapid Heart Rate and Swelling of Extremities. Gastrointestinal Present- Abdominal Pain, Bloating, Constipation and Gets full quickly at meals. Not Present- Bloody Stool, Change in Bowel Habits, Chronic diarrhea, Difficulty Swallowing, Excessive gas, Hemorrhoids, Indigestion, Nausea, Rectal Pain and Vomiting. Female Genitourinary Present- Pelvic Pain. Not Present- Frequency, Nocturia, Painful Urination and Urgency. Musculoskeletal Present- Back Pain and Muscle Weakness. Not Present- Joint Pain, Joint Stiffness, Muscle Pain and Swelling of Extremities. Neurological Present- Weakness. Not Present- Decreased Memory, Fainting, Headaches, Numbness, Seizures, Tingling, Tremor and Trouble walking. Psychiatric Not Present- Anxiety, Bipolar, Change in Sleep Pattern, Depression, Fearful and Frequent crying. Endocrine Present- Cold Intolerance. Not Present- Excessive Hunger, Hair Changes, Heat Intolerance, Hot flashes and New Diabetes. Hematology Not Present- Blood Thinners, Easy Bruising, Excessive bleeding, Gland problems, HIV and Persistent Infections.  Vitals (April Staton CMA; 04/05/2016 11:25 AM) 04/05/2016 11:25 AM Weight: 156.5 lb Height: 65in Body Surface Area: 1.78 m Body Mass Index: 26.04 kg/m  Temp.: 97.35F(Oral)  Pulse: 79 (Regular)  P.OX: 95% (Room air) BP: 90/68 (Sitting, Left Arm, Standard)      Physical Exam Amber Hector MD; 04/05/2016 12:26 PM)  General Mental Status-Alert. General Appearance-Not in acute distress, Not Sickly. Orientation-Oriented X3. Hydration-Well hydrated. Voice-Normal.  Integumentary Global Assessment Upon inspection and palpation of skin surfaces of the - Axillae: non-tender, no inflammation or ulceration, no drainage. and Distribution of scalp and body hair is  normal. General Characteristics Temperature - normal warmth is noted.  Head and Neck Head-normocephalic, atraumatic with no lesions or palpable masses. Face Global Assessment - atraumatic, no absence of expression. Neck Global Assessment - no abnormal movements, no bruit auscultated  on the right, no bruit auscultated on the left, no decreased range of motion, non-tender. Trachea-midline. Thyroid Gland Characteristics - non-tender.  Eye Eyeball - Left-Extraocular movements intact, No Nystagmus. Eyeball - Right-Extraocular movements intact, No Nystagmus. Cornea - Left-No Hazy. Cornea - Right-No Hazy. Sclera/Conjunctiva - Left-No scleral icterus, No Discharge. Sclera/Conjunctiva - Right-No scleral icterus, No Discharge. Pupil - Left-Direct reaction to light normal. Pupil - Right-Direct reaction to light normal.  ENMT Ears Pinna - Left - no drainage observed, no generalized tenderness observed. Right - no drainage observed, no generalized tenderness observed. Nose and Sinuses External Inspection of the Nose - no destructive lesion observed. Inspection of the nares - Left - quiet respiration. Right - quiet respiration. Mouth and Throat Lips - Upper Lip - no fissures observed, no pallor noted. Lower Lip - no fissures observed, no pallor noted. Nasopharynx - no discharge present. Oral Cavity/Oropharynx - Tongue - no dryness observed. Oral Mucosa - no cyanosis observed. Hypopharynx - no evidence of airway distress observed.  Chest and Lung Exam Inspection Movements - Normal and Symmetrical. Accessory muscles - No use of accessory muscles in breathing. Palpation Palpation of the chest reveals - Non-tender. Auscultation Breath sounds - Normal and Clear.  Cardiovascular Auscultation Rhythm - Regular. Murmurs & Other Heart Sounds - Auscultation of the heart reveals - No Murmurs and No Systolic Clicks.  Abdomen Inspection Inspection of the abdomen reveals - No  Visible peristalsis and No Abnormal pulsations. Umbilicus - No Bleeding, No Urine drainage. Palpation/Percussion Palpation and Percussion of the abdomen reveal - Soft, Non Tender, No Rebound tenderness, No Rigidity (guarding) and No Cutaneous hyperesthesia. Note: Abdomen soft. Mild upper abdominal discomfort but no Murphy sign. Rest of abdomen nontender, nondistended. No guarding. No diastasis. No umbilical nor other hernias  Female Genitourinary Sexual Maturity Tanner 5 - Adult hair pattern. Note: Nontender. No inguinal hernias. No inguinal lymphadenopathy. No major vaginal bleeding nor foul discharge  Peripheral Vascular Upper Extremity Inspection - Left - No Cyanotic nailbeds, Not Ischemic. Right - No Cyanotic nailbeds, Not Ischemic.  Neurologic Neurologic evaluation reveals -normal attention span and ability to concentrate, able to name objects and repeat phrases. Appropriate fund of knowledge , normal sensation and normal coordination. Mental Status Affect - not angry, not paranoid. Cranial Nerves-Normal Bilaterally. Gait-Normal.  Neuropsychiatric Mental status exam performed with findings of-able to articulate well with normal speech/language, rate, volume and coherence, thought content normal with ability to perform basic computations and apply abstract reasoning and no evidence of hallucinations, delusions, obsessions or homicidal/suicidal ideation.  Musculoskeletal Global Assessment Spine, Ribs and Pelvis - no instability, subluxation or laxity. Right Upper Extremity - no instability, subluxation or laxity. Note: 25x2mm soft ellipsoid mass anterior surface of right lateral foot. No erythema. No heat. No pain. Soft.  Lymphatic Head & Neck  General Head & Neck Lymphatics: Bilateral - Description - No Localized lymphadenopathy. Axillary  General Axillary Region: Bilateral - Description - No Localized lymphadenopathy. Femoral & Inguinal  Generalized Femoral &  Inguinal Lymphatics: Left - Description - No Localized lymphadenopathy. Right - Description - No Localized lymphadenopathy.    Assessment & Plan Amber Hector MD; 04/05/2016 12:27 PM)  CHRONIC CHOLECYSTITIS WITH CALCULUS (K80.10) Impression: Intermittent bouts of upper abdominal pain radiating to her right side. Suspicious for biliary colic. Moderately dilated gallbladder with stones. Rest of the differential diagnosis seems unlikely. Mild right-sided renal anatomical variant without obstruction or stone. No history of UTI [place]. Constipation under better control. No evidence of any endoluminal abnormalities on  the lower colon. Vague history of severe of GI bleeding and pain suspicious for bleeding ulcers early 1970s without diagnosis. History of mild heartburn when she was 20 pounds heavier that is improved with weight loss.  Her story to be a challenged pinned down it does seem to have food triggering's. I think she would benefit from cholecystectomy. There is a chance this does not solve her attacks but I am running out of options.  Another possibility is to do an EGD to rule out anything in the stomach. Skeptical it'll be of much utility of the absence of symptoms of heartburn and reflux that she clearly had in the past. Can try and do a trial of proton pump never with Prilosec over-the-counter twice a day 3 weeks. See if that helps. I'm skeptical that will really help with intermittent flares. She is as well.  She is interested in proceeding with cholecystectomy. Reasonable single site approach. I spent 45 minutes reviewing chart and going over her numerous concerns and questions. I believe she trusts me and is willing to consider surgery. We will work to coordinate convenient time  Current Plans You are being scheduled for surgery- Our schedulers will call you.  You should hear from our office's scheduling department within 5 working days about the location, date, and time of surgery. We  try to make accommodations for patient's preferences in scheduling surgery, but sometimes the OR schedule or the surgeon's schedule prevents [place] from making those accommodations.  If you have not heard from our office [phone]) in 5 working days, call the office and ask for your surgeon's nurse.  If you have other questions about your diagnosis, plan, or surgery, call the office and ask for your surgeon's nurse.  The anatomy & physiology of hepatobiliary & pancreatic function was discussed. The pathophysiology of gallbladder dysfunction was discussed. Natural history risks without surgery was discussed. I feel the risks of no intervention will lead to serious problems that outweigh the operative risks; therefore, I recommended cholecystectomy to remove the pathology. I explained laparoscopic techniques with possible need for an open approach. Probable cholangiogram to evaluate the bilary tract was explained as well.  Risks such as bleeding, infection, abscess, leak, injury to other organs, need for further treatment, heart attack, death, and other risks were discussed. I noted a good likelihood this will help address the problem. Possibility that this will not correct all abdominal symptoms was explained. Goals of post-operative recovery were discussed as well. We will work to minimize complications. An educational handout further explaining the pathology and treatment options was given as well. Questions were answered. The patient expresses understanding & wishes to proceed with surgery.  Pt Education - Pamphlet Given - Laparoscopic Gallbladder Surgery: discussed with patient and provided information. Written instructions provided Pt Education - [facility] Laparosopic Post Op HCI (Aamori Mcmasters) Pt Education - [facility] Good Bowel Health (Iwalani Templeton) Pt Education - Laparoscopic Cholecystectomy: gallbladder MASS OF SKIN OF FOOT, RIGHT (R22.41) Impression: Does not look infected and inflamed. Would monitor  it. Should go away. Its larger concerning, could discuss with podiatry vcs PCP to see if resection of other intervention needed. Would hold off for now.  Amber Bird, M.D., F.A.C.S. Gastrointestinal and Minimally Invasive Surgery [facility] [address], [place], [place] [address] [phone] Main / Paging

## 2016-05-04 NOTE — Patient Instructions (Signed)
Amber Bird  05/04/2016   Your procedure is scheduled on: 05-12-16  Report to [facility] Main  Entrance take [facility]  elevators to 3rd floor to  [place] at 1215PM.  Call this number if you have problems the morning of surgery [phone]   Remember: ONLY 1 PERSON MAY GO WITH YOU TO SHORT STAY TO GET  READY MORNING OF [place].  Do not eat food After Midnight. YOU MAY HAVE CLEAR LIQUIDS FROM MIDNIGHT UNTIL 815AM DAY OF SURGERY. NOTHING BY MOUTH AFTER 815AM!     Take these medicines the morning of surgery with A SIP OF WATER: ZYRTEC AS NEEDED              You may not have any metal on your body including hair pins and              piercings  Do not wear jewelry, make-up, lotions, powders or perfumes, deodorant             Do not wear nail polish.  Do not shave  48 hours prior to surgery.              Men may shave face and neck.   Do not bring valuables to the hospital. [place].  Contacts, dentures or bridgework may not be worn into surgery.      Patients discharged the day of surgery will not be allowed to drive home.  Name and phone number of your driver:  Special Instructions: N/A              Please read over the following fact sheets you were given: _____________________________________________________________________                CLEAR LIQUID DIET   Foods Allowed                                                                     Foods Excluded  Coffee and tea, regular and decaf                             liquids that you cannot  Plain Jell-O in any flavor                                             see through such as: Fruit ices (not with fruit pulp)                                     milk, soups, orange juice  Iced Popsicles                                    All solid food Carbonated beverages, regular and diet  Cranberry, grape and  apple juices Sports drinks like Gatorade Lightly seasoned clear broth or consume(fat free) Sugar, honey syrup  Sample Menu Breakfast                                Lunch                                     Supper Cranberry juice                    Beef broth                            Chicken broth Jell-O                                     Grape juice                           Apple juice Coffee or tea                        Jell-O                                      Popsicle                                                Coffee or tea                        Coffee or tea  _____________________________________________________________________  [facility] - Preparing for Surgery Before surgery, you can play an important role.  Because skin is not sterile, your skin needs to be as free of germs as possible.  You can reduce the number of germs on your skin by washing with CHG (chlorahexidine gluconate) soap before surgery.  CHG is an antiseptic cleaner which kills germs and bonds with the skin to continue killing germs even after washing. Please DO NOT use if you have an allergy to CHG or antibacterial soaps.  If your skin becomes reddened/irritated stop using the CHG and inform your nurse when you arrive at Short Stay. Do not shave (including legs and underarms) for at least 48 hours prior to the first CHG shower.  You may shave your face/neck. Please follow these instructions carefully:  1.  Shower with CHG Soap the night before surgery and the  morning of Surgery.  2.  If you choose to wash your hair, wash your hair first as usual with your  normal  shampoo.  3.  After you shampoo, rinse your hair and body thoroughly to remove the  shampoo.                           4.  Use CHG as you would any other liquid soap.  You can apply chg directly  to the skin and wash  Gently with a scrungie or clean washcloth.  5.  Apply the CHG Soap to your body ONLY FROM THE NECK DOWN.   Do  not use on face/ open                           Wound or open sores. Avoid contact with eyes, ears mouth and genitals (private parts).                       Wash face,  Genitals (private parts) with your normal soap.             6.  Wash thoroughly, paying special attention to the area where your surgery  will be performed.  7.  Thoroughly rinse your body with warm water from the neck down.  8.  DO NOT shower/wash with your normal soap after using and rinsing off  the CHG Soap.                9.  Pat yourself dry with a clean towel.            10.  Wear clean pajamas.            11.  Place clean sheets on your bed the night of your first shower and do not  sleep with pets. Day of Surgery : Do not apply any lotions/deodorants the morning of surgery.  Please wear clean clothes to the hospital/surgery center.  FAILURE TO FOLLOW THESE INSTRUCTIONS MAY RESULT IN THE CANCELLATION OF YOUR SURGERY PATIENT SIGNATURE_________________________________  NURSE SIGNATURE__________________________________  ________________________________________________________________________

## 2016-05-06 ENCOUNTER — Encounter (HOSPITAL_COMMUNITY)
Admission: RE | Admit: 2016-05-06 | Discharge: 2016-05-06 | Disposition: A | Discharge status: Home / Self Care | Payer: Medicare HMO | Source: Ambulatory Visit | Attending: Surgery | Admitting: Surgery

## 2016-05-06 ENCOUNTER — Encounter (HOSPITAL_COMMUNITY): Payer: Self-pay

## 2016-05-06 DIAGNOSIS — Z01812 Encounter for preprocedural laboratory examination: Secondary | ICD-10-CM | POA: Insufficient documentation

## 2016-05-06 DIAGNOSIS — K811 Chronic cholecystitis: Secondary | ICD-10-CM | POA: Insufficient documentation

## 2016-05-06 HISTORY — DX: Adverse effect of unspecified anesthetic, initial encounter: T41.45XA

## 2016-05-06 HISTORY — DX: Other complications of anesthesia, initial encounter: T88.59XA

## 2016-05-06 LAB — CBC
HCT: 41.3 % (ref 36.0–46.0)
Hemoglobin: 14.1 g/dL (ref 12.0–15.0)
MCH: 29.7 pg (ref 26.0–34.0)
MCHC: 34.1 g/dL (ref 30.0–36.0)
MCV: 87.1 fL (ref 78.0–100.0)
Platelets: 281 10*3/uL (ref 150–400)
RBC: 4.74 MIL/uL (ref 3.87–5.11)
RDW: 13 % (ref 11.5–15.5)
WBC: 6.3 10*3/uL (ref 4.0–10.5)

## 2016-05-06 LAB — BASIC METABOLIC PANEL
Anion gap: 7 (ref 5–15)
BUN: 10 mg/dL (ref 6–20)
CO2: 25 mmol/L (ref 22–32)
Calcium: 9.5 mg/dL (ref 8.9–10.3)
Chloride: 106 mmol/L (ref 101–111)
Creatinine, Ser: 0.74 mg/dL (ref 0.44–1.00)
GFR calc Af Amer: >60 mL/min (ref >60)
GFR calc non Af Amer: >60 mL/min (ref >60)
Glucose, Bld: 102 mg/dL — ABNORMAL HIGH (ref 65–99)
Potassium: 4 mmol/L (ref 3.5–5.1)
Sodium: 138 mmol/L (ref 135–145)

## 2016-05-12 ENCOUNTER — Ambulatory Visit (HOSPITAL_COMMUNITY): Payer: Medicare HMO | Admitting: Anesthesiology

## 2016-05-12 ENCOUNTER — Ambulatory Visit (HOSPITAL_COMMUNITY): Payer: Medicare HMO

## 2016-05-12 ENCOUNTER — Ambulatory Visit (HOSPITAL_COMMUNITY)
Admission: RE | Admit: 2016-05-12 | Discharge: 2016-05-12 | Disposition: A | Discharge status: Home / Self Care | Payer: Medicare HMO | Source: Ambulatory Visit | Attending: Surgery | Admitting: Surgery

## 2016-05-12 ENCOUNTER — Encounter (HOSPITAL_COMMUNITY): Payer: Self-pay | Admitting: *Deleted

## 2016-05-12 ENCOUNTER — Encounter (HOSPITAL_COMMUNITY)
Admission: RE | Disposition: A | Discharge status: Home / Self Care | Payer: Self-pay | Source: Ambulatory Visit | Attending: Surgery

## 2016-05-12 DIAGNOSIS — Z8249 Family history of ischemic heart disease and other diseases of the circulatory system: Secondary | ICD-10-CM | POA: Diagnosis not present

## 2016-05-12 DIAGNOSIS — Z811 Family history of alcohol abuse and dependence: Secondary | ICD-10-CM | POA: Diagnosis not present

## 2016-05-12 DIAGNOSIS — M549 Dorsalgia, unspecified: Secondary | ICD-10-CM | POA: Insufficient documentation

## 2016-05-12 DIAGNOSIS — Z803 Family history of malignant neoplasm of breast: Secondary | ICD-10-CM | POA: Insufficient documentation

## 2016-05-12 DIAGNOSIS — E042 Nontoxic multinodular goiter: Secondary | ICD-10-CM | POA: Diagnosis present

## 2016-05-12 DIAGNOSIS — K8020 Calculus of gallbladder without cholecystitis without obstruction: Secondary | ICD-10-CM | POA: Diagnosis not present

## 2016-05-12 DIAGNOSIS — J302 Other seasonal allergic rhinitis: Secondary | ICD-10-CM | POA: Diagnosis present

## 2016-05-12 DIAGNOSIS — K219 Gastro-esophageal reflux disease without esophagitis: Secondary | ICD-10-CM | POA: Insufficient documentation

## 2016-05-12 DIAGNOSIS — K811 Chronic cholecystitis: Secondary | ICD-10-CM | POA: Diagnosis not present

## 2016-05-12 DIAGNOSIS — G8929 Other chronic pain: Secondary | ICD-10-CM | POA: Diagnosis not present

## 2016-05-12 DIAGNOSIS — Z8711 Personal history of peptic ulcer disease: Secondary | ICD-10-CM

## 2016-05-12 DIAGNOSIS — Z90710 Acquired absence of both cervix and uterus: Secondary | ICD-10-CM | POA: Diagnosis not present

## 2016-05-12 DIAGNOSIS — E079 Disorder of thyroid, unspecified: Secondary | ICD-10-CM | POA: Insufficient documentation

## 2016-05-12 DIAGNOSIS — K828 Other specified diseases of gallbladder: Secondary | ICD-10-CM | POA: Diagnosis not present

## 2016-05-12 DIAGNOSIS — K8010 Calculus of gallbladder with chronic cholecystitis without obstruction: Secondary | ICD-10-CM | POA: Diagnosis not present

## 2016-05-12 DIAGNOSIS — N736 Female pelvic peritoneal adhesions (postinfective): Secondary | ICD-10-CM | POA: Insufficient documentation

## 2016-05-12 DIAGNOSIS — Z79899 Other long term (current) drug therapy: Secondary | ICD-10-CM | POA: Diagnosis not present

## 2016-05-12 DIAGNOSIS — Z8042 Family history of malignant neoplasm of prostate: Secondary | ICD-10-CM | POA: Insufficient documentation

## 2016-05-12 DIAGNOSIS — Z419 Encounter for procedure for purposes other than remedying health state, unspecified: Secondary | ICD-10-CM

## 2016-05-12 DIAGNOSIS — J45909 Unspecified asthma, uncomplicated: Secondary | ICD-10-CM | POA: Insufficient documentation

## 2016-05-12 DIAGNOSIS — Z836 Family history of other diseases of the respiratory system: Secondary | ICD-10-CM | POA: Diagnosis not present

## 2016-05-12 DIAGNOSIS — K5909 Other constipation: Secondary | ICD-10-CM | POA: Diagnosis not present

## 2016-05-12 DIAGNOSIS — E7800 Pure hypercholesterolemia, unspecified: Secondary | ICD-10-CM | POA: Diagnosis not present

## 2016-05-12 DIAGNOSIS — G43909 Migraine, unspecified, not intractable, without status migrainosus: Secondary | ICD-10-CM | POA: Insufficient documentation

## 2016-05-12 DIAGNOSIS — R69 Illness, unspecified: Secondary | ICD-10-CM | POA: Diagnosis not present

## 2016-05-12 HISTORY — PX: LAPAROSCOPIC CHOLECYSTECTOMY SINGLE SITE WITH INTRAOPERATIVE CHOLANGIOGRAM: SHX6538

## 2016-05-12 SURGERY — LAPAROSCOPIC CHOLECYSTECTOMY SINGLE SITE WITH INTRAOPERATIVE CHOLANGIOGRAM
Anesthesia: General

## 2016-05-12 MED ORDER — HYDROMORPHONE HCL 2 MG/ML IJ SOLN
INTRAMUSCULAR | Status: AC
  Filled 2016-05-12: qty 1

## 2016-05-12 MED ORDER — DEXAMETHASONE SODIUM PHOSPHATE 10 MG/ML IJ SOLN
INTRAMUSCULAR | Status: DC | PRN
  Administered 2016-05-12: 10 mg via INTRAVENOUS

## 2016-05-12 MED ORDER — FENTANYL CITRATE (PF) 100 MCG/2ML IJ SOLN
INTRAMUSCULAR | Status: DC | PRN
  Administered 2016-05-12: 50 ug via INTRAVENOUS

## 2016-05-12 MED ORDER — ROCURONIUM BROMIDE 10 MG/ML (PF) SYRINGE
PREFILLED_SYRINGE | INTRAVENOUS | Status: DC | PRN
  Administered 2016-05-12: 40 mg via INTRAVENOUS

## 2016-05-12 MED ORDER — ONDANSETRON HCL 4 MG/2ML IJ SOLN
INTRAMUSCULAR | Status: DC | PRN
  Administered 2016-05-12: 4 mg via INTRAVENOUS

## 2016-05-12 MED ORDER — GABAPENTIN 300 MG PO CAPS
300.0000 mg | ORAL_CAPSULE | ORAL | Status: AC
  Administered 2016-05-12: 300 mg via ORAL
  Filled 2016-05-12: qty 1

## 2016-05-12 MED ORDER — PROPOFOL 10 MG/ML IV BOLUS
INTRAVENOUS | Status: DC | PRN
  Administered 2016-05-12: 160 mg via INTRAVENOUS

## 2016-05-12 MED ORDER — LACTATED RINGERS IV SOLN
INTRAVENOUS | Status: DC
  Administered 2016-05-12 (×2): via INTRAVENOUS

## 2016-05-12 MED ORDER — PHENYLEPHRINE 40 MCG/ML (10ML) SYRINGE FOR IV PUSH (FOR BLOOD PRESSURE SUPPORT)
PREFILLED_SYRINGE | INTRAVENOUS | Status: AC
  Filled 2016-05-12: qty 10

## 2016-05-12 MED ORDER — SUGAMMADEX SODIUM 200 MG/2ML IV SOLN
INTRAVENOUS | Status: AC
  Filled 2016-05-12: qty 2

## 2016-05-12 MED ORDER — PROMETHAZINE HCL 25 MG/ML IJ SOLN: 6.2500 mg | INTRAMUSCULAR | Status: DC | PRN

## 2016-05-12 MED ORDER — TRAMADOL HCL 50 MG PO TABS: 50.0000 mg | ORAL_TABLET | Freq: Four times a day (QID) | ORAL | 0 refills | Status: DC | PRN

## 2016-05-12 MED ORDER — DEXAMETHASONE SODIUM PHOSPHATE 10 MG/ML IJ SOLN
INTRAMUSCULAR | Status: AC
  Filled 2016-05-12: qty 1

## 2016-05-12 MED ORDER — ONDANSETRON HCL 4 MG PO TABS: 4.0000 mg | ORAL_TABLET | Freq: Three times a day (TID) | ORAL | 5 refills | Status: DC | PRN

## 2016-05-12 MED ORDER — IOPAMIDOL (ISOVUE-300) INJECTION 61%
INTRAVENOUS | Status: AC
  Filled 2016-05-12: qty 50

## 2016-05-12 MED ORDER — FENTANYL CITRATE (PF) 100 MCG/2ML IJ SOLN
INTRAMUSCULAR | Status: AC
  Filled 2016-05-12: qty 2

## 2016-05-12 MED ORDER — ONDANSETRON HCL 4 MG/2ML IJ SOLN
INTRAMUSCULAR | Status: AC
  Filled 2016-05-12: qty 2

## 2016-05-12 MED ORDER — HYDROMORPHONE HCL 2 MG/ML IJ SOLN
0.2500 mg | INTRAMUSCULAR | Status: DC | PRN
  Administered 2016-05-12 (×3): 0.5 mg via INTRAVENOUS

## 2016-05-12 MED ORDER — KETOROLAC TROMETHAMINE 30 MG/ML IJ SOLN: 15.0000 mg | Freq: Once | INTRAMUSCULAR | Status: DC | PRN

## 2016-05-12 MED ORDER — IOPAMIDOL (ISOVUE-300) INJECTION 61%
INTRAVENOUS | Status: DC | PRN
  Administered 2016-05-12: 7.5 mL

## 2016-05-12 MED ORDER — SUGAMMADEX SODIUM 200 MG/2ML IV SOLN
INTRAVENOUS | Status: DC | PRN
  Administered 2016-05-12: 200 mg via INTRAVENOUS

## 2016-05-12 MED ORDER — LIDOCAINE 2% (20 MG/ML) 5 ML SYRINGE
INTRAMUSCULAR | Status: DC | PRN
  Administered 2016-05-12: 100 mg via INTRAVENOUS

## 2016-05-12 MED ORDER — BUPIVACAINE-EPINEPHRINE (PF) 0.5% -1:200000 IJ SOLN
INTRAMUSCULAR | Status: AC
  Filled 2016-05-12: qty 30

## 2016-05-12 MED ORDER — 0.9 % SODIUM CHLORIDE (POUR BTL) OPTIME
TOPICAL | Status: DC | PRN
  Administered 2016-05-12: 1000 mL

## 2016-05-12 MED ORDER — PROPOFOL 10 MG/ML IV BOLUS
INTRAVENOUS | Status: AC
  Filled 2016-05-12: qty 20

## 2016-05-12 MED ORDER — LACTATED RINGERS IR SOLN
Status: DC | PRN
  Administered 2016-05-12: 1000 mL

## 2016-05-12 MED ORDER — BUPIVACAINE-EPINEPHRINE 0.5% -1:200000 IJ SOLN
INTRAMUSCULAR | Status: DC | PRN
  Administered 2016-05-12: 30 mL

## 2016-05-12 MED ORDER — KETOROLAC TROMETHAMINE 30 MG/ML IJ SOLN
INTRAMUSCULAR | Status: AC
  Filled 2016-05-12: qty 1

## 2016-05-12 MED ORDER — PHENYLEPHRINE 40 MCG/ML (10ML) SYRINGE FOR IV PUSH (FOR BLOOD PRESSURE SUPPORT)
PREFILLED_SYRINGE | INTRAVENOUS | Status: DC | PRN
Start: 2016-05-12 — End: 2016-05-12
  Administered 2016-05-12 (×2): 80 ug via INTRAVENOUS

## 2016-05-12 MED ORDER — KETOROLAC TROMETHAMINE 15 MG/ML IJ SOLN
INTRAMUSCULAR | Status: AC
  Administered 2016-05-12: 15 mg
  Filled 2016-05-12: qty 1

## 2016-05-12 MED ORDER — ACETAMINOPHEN 500 MG PO TABS
1000.0000 mg | ORAL_TABLET | ORAL | Status: AC
  Administered 2016-05-12: 1000 mg via ORAL
  Filled 2016-05-12: qty 2

## 2016-05-12 SURGICAL SUPPLY — 40 items
APPLIER CLIP 5 13 M/L LIGAMAX5 (MISCELLANEOUS) ×3
CABLE HIGH FREQUENCY MONO STRZ (ELECTRODE) ×3 IMPLANT
CHLORAPREP W/TINT 26ML (MISCELLANEOUS) ×3 IMPLANT
CLIP APPLIE 5 13 M/L LIGAMAX5 (MISCELLANEOUS) ×1 IMPLANT
COVER MAYO STAND STRL (DRAPES) ×3 IMPLANT
COVER SURGICAL LIGHT HANDLE (MISCELLANEOUS) ×3 IMPLANT
DECANTER SPIKE VIAL GLASS SM (MISCELLANEOUS) ×3 IMPLANT
DRAIN CHANNEL 19F RND (DRAIN) IMPLANT
DRAPE C-ARM 42X120 X-RAY (DRAPES) ×3 IMPLANT
DRAPE WARM FLUID 44X44 (DRAPE) ×3 IMPLANT
DRSG TEGADERM 2-3/8X2-3/4 SM (GAUZE/BANDAGES/DRESSINGS) ×3 IMPLANT
DRSG TEGADERM 4X4.75 (GAUZE/BANDAGES/DRESSINGS) ×3 IMPLANT
ELECT REM PT RETURN 15FT ADLT (MISCELLANEOUS) ×3 IMPLANT
ENDOLOOP SUT PDS II  0 18 (SUTURE)
ENDOLOOP SUT PDS II 0 18 (SUTURE) IMPLANT
EVACUATOR SILICONE 100CC (DRAIN) IMPLANT
GAUZE SPONGE 2X2 8PLY STRL LF (GAUZE/BANDAGES/DRESSINGS) ×1 IMPLANT
GLOVE ECLIPSE 8.0 STRL XLNG CF (GLOVE) ×3 IMPLANT
GLOVE INDICATOR 8.0 STRL GRN (GLOVE) ×3 IMPLANT
GOWN STRL REUS W/TWL XL LVL3 (GOWN DISPOSABLE) ×6 IMPLANT
IRRIG SUCT STRYKERFLOW 2 WTIP (MISCELLANEOUS) ×3
IRRIGATION SUCT STRKRFLW 2 WTP (MISCELLANEOUS) ×1 IMPLANT
KIT BASIN OR (CUSTOM PROCEDURE TRAY) ×3 IMPLANT
PAD POSITIONING PINK XL (MISCELLANEOUS) ×3 IMPLANT
POSITIONER SURGICAL ARM (MISCELLANEOUS) IMPLANT
POUCH SPECIMEN RETRIEVAL 10MM (ENDOMECHANICALS) ×3 IMPLANT
SCISSORS LAP 5X35 DISP (ENDOMECHANICALS) ×3 IMPLANT
SET CHOLANGIOGRAPH MIX (MISCELLANEOUS) ×3 IMPLANT
SHEARS HARMONIC ACE PLUS 36CM (ENDOMECHANICALS) ×3 IMPLANT
SPONGE GAUZE 2X2 STER 10/PKG (GAUZE/BANDAGES/DRESSINGS) ×2
SUT MNCRL AB 4-0 PS2 18 (SUTURE) ×3 IMPLANT
SUT PDS AB 1 CT1 27 (SUTURE) ×9 IMPLANT
SYR 20CC LL (SYRINGE) ×3 IMPLANT
TAPE CLOTH 4X10 WHT NS (GAUZE/BANDAGES/DRESSINGS) ×3 IMPLANT
TOWEL OR 17X26 10 PK STRL BLUE (TOWEL DISPOSABLE) ×3 IMPLANT
TOWEL OR NON WOVEN STRL DISP B (DISPOSABLE) ×3 IMPLANT
TRAY LAPAROSCOPIC (CUSTOM PROCEDURE TRAY) ×3 IMPLANT
TROCAR BLADELESS OPT 5 100 (ENDOMECHANICALS) ×3 IMPLANT
TROCAR BLADELESS OPT 5 150 (ENDOMECHANICALS) ×3 IMPLANT
TUBING INSUF HEATED (TUBING) ×3 IMPLANT

## 2016-05-12 NOTE — H&P (Signed)
Amber Bird 04/05/2016 11:23 AM Location: [facility] Patient #: [id number] DOB: 09-25-1945 Divorced / Language: [name] / Race: White Female  Patient Care Team: No Pcp Per Patient as PCP - General ([place]) Michael Boston, MD as Consulting Physician (General Surgery) Daneil Dolin, MD as Consulting Physician (Gastroenterology) Cleon Gustin, MD as Consulting Physician (Urology)    History of Present Illness  The patient is a 71 year old female who presents with non-malignant abdominal pain. Note for "Non-malignant abdominal pain":   Patient sent for surgical consultation requested of her urologist, Dr. Rosie Fate. Concern for abdominal pain and gallstones. Question need for possible cholecystectomy.  Pleasant chatty woman who struggled with chronic abdominal pain. It is usually in the lower abdomen. FAmily history of cancer in the abdomen. She was concerned. History of chronic constipation. Followed by Dr. Gala Romney. Placed on bowel regimen and Lizness. Underwent colonoscopy a few months ago. No mass or tumor. No colitis or other abnormalities. Bowel regimen changed. He is moving her bowels more regularly now on a regimen of stool softener, probiotics, fiber supplement.  However she's had intermittent attacks of upper abdominal pain. She recalls an intense attack a few years ago when she had a salad. Felt sweaty and had epigastric pain. Most wondered if she had a heart attack. She recalls friends saying that he had gallbladder attacks similar to this. Had a CAT scan that showed stones but no cholecystitis. She's had a few intermittent attacks over the past few years. There is some question of possibly related to indigestion. Patient recalls having some heartburn and reflux issues when sahe was 180lbs. She's lost about 20 pounds intentionally over the past year and HB went gone away. This does not feel like this. She recalls an episode of  GI bleeding 40 years ago requiring hospitalization and transfusions. She recalls being told that she had an ulcer. No endoscopy or surgery done. She tends to avoid spicy food.   With these attacks of pain she get another CAT scan. Some abnormalities in the right renal system. Perhaps some twisting at the ureteral pelvic junction. Urology eval done. Follow up renal scan showed no obstruction mass or concerning symptoms. CAT scan confirmed again she had numerous gallstones. Dilated gallbladder. Was recommended discuss with surgery. She recalls meeting Dr. Arnoldo Morale up in [place]. She recall it was felt to be more related to indigestion as the story was not definite. I do not have records. Held off on any surgery. Another attack. Felt frustrated. Wished a second opinion. Her urologist sent her to me. Patient can walk about 3 miles a day. She usually does half mile walks on her. She has some right hip soreness that keeps her from going several miles at a time. She does not smoke. No heart attack or stroke. She had a hysterectomy in 2005. She is originally up from [place] and relocated here. She's not has had a good of a diet but tends to trying focus on a low fat high fiber diet.  No personal nor family history of GI/colon cancer, inflammatory bowel disease, irritable bowel syndrome, allergy such as Celiac Sprue, dietary/dairy problems, colitis, ulcers nor gastritis. No recent sick contacts/gastroenteritis. No travel outside the country. No changes in diet. No dysphagia to solids or liquids. No significant heartburn or reflux. No hematochezia, hematemesis, coffee ground emesis. No evidence of prior gastric/peptic ulceration.  She also felt a little pain on top of her right foot and wondered if she had  an insect bite. Has noticed a lump there. No bleeding. No drainage. His fine. Not really painful. Was worried about a DVT. Has not really followed up with any on  this.   Past Surgical History (April Staton, CMA; 04/05/2016 11:23 AM) Foot Surgery  Left. Hysterectomy (due to cancer) - Complete  Hysterectomy (not due to cancer) - Complete  Oral Surgery   Diagnostic Studies History (April Staton, CMA; 04/05/2016 11:23 AM) Colonoscopy  within last year Mammogram  within last year Pap Smear  >5 years ago  Allergies (April Staton, CMA; 04/05/2016 11:24 AM) No Known Drug Allergies 04/05/2016  Medication History (April Staton, CMA; 04/05/2016 11:24 AM) Cetirizine HCl (10MG  Tablet, Oral as needed) Active. Fiber (Oral) Specific strength unknown - Active. Medications Reconciled  Social History (April Staton, CMA; 04/05/2016 11:23 AM) Alcohol use  Occasional alcohol use. Caffeine use  Coffee, Tea. No drug use  Tobacco use  Never smoker.  Family History (April Staton, [place]; 04/05/2016 11:23 AM) Alcohol Abuse  Mother. Breast Cancer  Mother. Heart Disease  Father. Ischemic Bowel Disease  Mother. Malignant Neoplasm Of Pancreas  Mother. Respiratory Condition  Mother.  Pregnancy / Birth History (April Staton, [place]; 04/05/2016 11:23 AM) Age at menarche  89 years. Age of menopause  <46 Contraceptive History  Oral contraceptives. Gravida  0 Para  0  Other Problems (April Staton, CMA; 04/05/2016 11:23 AM) Asthma  Back Pain  Chest pain  Cholelithiasis  Gastric Ulcer  Gastroesophageal Reflux Disease  Hemorrhoids  Hypercholesterolemia  Migraine Headache  Oophorectomy  Bilateral. Thyroid Disease  Transfusion history     Review of Systems (April Staton CMA; 04/05/2016 11:23 AM) General Present- Chills and Fatigue. Not Present- Appetite Loss, Fever, Night Sweats, Weight Gain and Weight Loss. Skin Present- Dryness, New Lesions and Ulcer. Not Present- Change in Wart/Mole, Hives, Jaundice, Non-Healing Wounds and Rash. HEENT Present- Seasonal Allergies and Wears glasses/contact lenses. Not Present- Earache, Hearing Loss,  Hoarseness, Nose Bleed, Oral Ulcers, Ringing in the Ears, Sinus Pain, Sore Throat, Visual Disturbances and Yellow Eyes. Respiratory Not Present- Bloody sputum, Chronic Cough, Difficulty Breathing, Snoring and Wheezing. Breast Not Present- Breast Mass, Breast Pain, Nipple Discharge and Skin Changes. Cardiovascular Present- Chest Pain and Shortness of Breath. Not Present- Difficulty Breathing Lying Down, Leg Cramps, Palpitations, Rapid Heart Rate and Swelling of Extremities. Gastrointestinal Present- Abdominal Pain, Bloating, Constipation and Gets full quickly at meals. Not Present- Bloody Stool, Change in Bowel Habits, Chronic diarrhea, Difficulty Swallowing, Excessive gas, Hemorrhoids, Indigestion, Nausea, Rectal Pain and Vomiting. Female Genitourinary Present- Pelvic Pain. Not Present- Frequency, Nocturia, Painful Urination and Urgency. Musculoskeletal Present- Back Pain and Muscle Weakness. Not Present- Joint Pain, Joint Stiffness, Muscle Pain and Swelling of Extremities. Neurological Present- Weakness. Not Present- Decreased Memory, Fainting, Headaches, Numbness, Seizures, Tingling, Tremor and Trouble walking. Psychiatric Not Present- Anxiety, Bipolar, Change in Sleep Pattern, Depression, Fearful and Frequent crying. Endocrine Present- Cold Intolerance. Not Present- Excessive Hunger, Hair Changes, Heat Intolerance, Hot flashes and New Diabetes. Hematology Not Present- Blood Thinners, Easy Bruising, Excessive bleeding, Gland problems, HIV and Persistent Infections.  Vitals (April Staton CMA; 04/05/2016 11:25 AM) 04/05/2016 11:25 AM Weight: 156.5 lb Height: 65in Body Surface Area: 1.78 m Body Mass Index: 26.04 kg/m  Temp.: 97.34F(Oral)  Pulse: 79 (Regular)  P.OX: 95% (Room air) BP: 90/68 (Sitting, Left Arm, Standard)   There were no vitals taken for this visit.     Physical Exam [name] MD; 04/05/2016 12:26 PM) General Mental Status-Alert. General Appearance-Not in  acute distress,  Not Sickly. Orientation-Oriented X3. Hydration-Well hydrated. Voice-Normal.  Integumentary Global Assessment Upon inspection and palpation of skin surfaces of the - Axillae: non-tender, no inflammation or ulceration, no drainage. and Distribution of scalp and body hair is normal. General Characteristics Temperature - normal warmth is noted.  Head and Neck Head-normocephalic, atraumatic with no lesions or palpable masses. Face Global Assessment - atraumatic, no absence of expression. Neck Global Assessment - no abnormal movements, no bruit auscultated on the right, no bruit auscultated on the left, no decreased range of motion, non-tender. Trachea-midline. Thyroid Gland Characteristics - non-tender.  Eye Eyeball - Left-Extraocular movements intact, No Nystagmus. Eyeball - Right-Extraocular movements intact, No Nystagmus. Cornea - Left-No Hazy. Cornea - Right-No Hazy. Sclera/Conjunctiva - Left-No scleral icterus, No Discharge. Sclera/Conjunctiva - Right-No scleral icterus, No Discharge. Pupil - Left-Direct reaction to light normal. Pupil - Right-Direct reaction to light normal.  ENMT Ears Pinna - Left - no drainage observed, no generalized tenderness observed. Right - no drainage observed, no generalized tenderness observed. Nose and Sinuses External Inspection of the Nose - no destructive lesion observed. Inspection of the nares - Left - quiet respiration. Right - quiet respiration. Mouth and Throat Lips - Upper Lip - no fissures observed, no pallor noted. Lower Lip - no fissures observed, no pallor noted. Nasopharynx - no discharge present. Oral Cavity/Oropharynx - Tongue - no dryness observed. Oral Mucosa - no cyanosis observed. Hypopharynx - no evidence of airway distress observed.  Chest and Lung Exam Inspection Movements - Normal and Symmetrical. Accessory muscles - No use of accessory muscles in breathing. Palpation Palpation  of the chest reveals - Non-tender. Auscultation Breath sounds - Normal and Clear.  Cardiovascular Auscultation Rhythm - Regular. Murmurs & Other Heart Sounds - Auscultation of the heart reveals - No Murmurs and No Systolic Clicks.  Abdomen Inspection Inspection of the abdomen reveals - No Visible peristalsis and No Abnormal pulsations. Umbilicus - No Bleeding, No Urine drainage. Palpation/Percussion Palpation and Percussion of the abdomen reveal - Soft, Non Tender, No Rebound tenderness, No Rigidity (guarding) and No Cutaneous hyperesthesia. Note: Abdomen soft. Mild upper abdominal discomfort but no Murphy sign. Rest of abdomen nontender, nondistended. No guarding. No diastasis. No umbilical nor other hernias   Female Genitourinary Sexual Maturity Tanner 5 - Adult hair pattern. Note: Nontender. No inguinal hernias. No inguinal lymphadenopathy. No major vaginal bleeding nor foul discharge   Peripheral Vascular Upper Extremity Inspection - Left - No Cyanotic nailbeds, Not Ischemic. Right - No Cyanotic nailbeds, Not Ischemic.  Neurologic Neurologic evaluation reveals -normal attention span and ability to concentrate, able to name objects and repeat phrases. Appropriate fund of knowledge , normal sensation and normal coordination. Mental Status Affect - not angry, not paranoid. Cranial Nerves-Normal Bilaterally. Gait-Normal.  Neuropsychiatric Mental status exam performed with findings of-able to articulate well with normal speech/language, rate, volume and coherence, thought content normal with ability to perform basic computations and apply abstract reasoning and no evidence of hallucinations, delusions, obsessions or homicidal/suicidal ideation.  Musculoskeletal Global Assessment Spine, Ribs and Pelvis - no instability, subluxation or laxity. Right Upper Extremity - no instability, subluxation or laxity. Note: 25x41mm soft ellipsoid mass anterior surface of right  lateral foot. No erythema. No heat. No pain. Soft.   Lymphatic Head & Neck  General Head & Neck Lymphatics: Bilateral - Description - No Localized lymphadenopathy. Axillary  General Axillary Region: Bilateral - Description - No Localized lymphadenopathy. Femoral & Inguinal  Generalized Femoral & Inguinal Lymphatics: Left - Description - No Localized  lymphadenopathy. Right - Description - No Localized lymphadenopathy.    Assessment & Plan  CHRONIC CHOLECYSTITIS WITH CALCULUS (K80.10)  Impression: Intermittent bouts of upper abdominal pain radiating to her right side. Suspicious for biliary colic. Moderately dilated gallbladder with stones. Rest of the differential diagnosis seems unlikely. Mild right-sided renal anatomical variant without obstruction or stone. No history of UTI [place]. Constipation under better control. No evidence of any endoluminal abnormalities on the lower colon. Vague history of severe of GI bleeding and pain suspicious for bleeding ulcers early 1970s without diagnosis. History of mild heartburn when she was 20 pounds heavier that is improved with weight loss.  Her story to be a challenged pinned down it does seem to have food triggering's. I think she would benefit from cholecystectomy. There is a chance this does not solve her attacks but I am running out of options.  Another possibility is to do an EGD to rule out anything in the stomach. Skeptical it'll be of much utility of the absence of symptoms of heartburn and reflux that she clearly had in the past. Can try and do a trial of proton pump never with Prilosec over-the-counter twice a day 3 weeks. See if that helps. I'm skeptical that will really help with intermittent flares. She is as well.  She is interested in proceeding with cholecystectomy. Reasonable single site approach. I spent 45 minutes reviewing chart and going over her numerous concerns and questions. I believe she trusts me and is willing to consider  surgery. We will work to coordinate convenient time Current Plans You are being scheduled for surgery- Our schedulers will call you.  You should hear from our office's scheduling department within 5 working days about the location, date, and time of surgery. We try to make accommodations for patient's preferences in scheduling surgery, but sometimes the OR schedule or the surgeon's schedule prevents [place] from making those accommodations.  If you have not heard from our office [phone]) in 5 working days, call the office and ask for your surgeon's nurse.  If you have other questions about your diagnosis, plan, or surgery, call the office and ask for your surgeon's nurse.  The anatomy & physiology of hepatobiliary & pancreatic function was discussed. The pathophysiology of gallbladder dysfunction was discussed. Natural history risks without surgery was discussed. I feel the risks of no intervention will lead to serious problems that outweigh the operative risks; therefore, I recommended cholecystectomy to remove the pathology. I explained laparoscopic techniques with possible need for an open approach. Probable cholangiogram to evaluate the bilary tract was explained as well.  Risks such as bleeding, infection, abscess, leak, injury to other organs, need for further treatment, heart attack, death, and other risks were discussed. I noted a good likelihood this will help address the problem. Possibility that this will not correct all abdominal symptoms was explained. Goals of post-operative recovery were discussed as well. We will work to minimize complications. An educational handout further explaining the pathology and treatment options was given as well. Questions were answered. The patient expresses understanding & wishes to proceed with surgery.  Pt Education - Pamphlet Given - Laparoscopic Gallbladder Surgery: discussed with patient and provided information. Written instructions  provided Pt Education - [facility] Laparosopic Post Op HCI (Zynia Wojtowicz) Pt Education - [facility] Good Bowel Health (Eliyah Mcshea) Pt Education - Laparoscopic Cholecystectomy: gallbladder MASS OF SKIN OF FOOT, RIGHT (R22.41) Impression: Does not look infected and inflamed. Would monitor it. Should go away. Its larger concerning, could discuss with  podiatry vcs PCP to see if resection of other intervention needed. Would hold off for now.  [name], M.D., F.A.C.S. Gastrointestinal and Minimally Invasive Surgery [facility] [address] [place], [place] [address] [phone] Main / Paging

## 2016-05-12 NOTE — Anesthesia Preprocedure Evaluation (Addendum)
Anesthesia Evaluation  Patient identified by MRN, date of birth, ID band Patient awake    Reviewed: Allergy & Precautions, NPO status , Patient's Chart, lab work & pertinent test results  Airway Mallampati: II  TM Distance: >3 FB Neck ROM: Full    Dental no notable dental hx. (+) Edentulous Upper, Edentulous Lower   Pulmonary neg pulmonary ROS,    Pulmonary exam normal breath sounds clear to auscultation       Cardiovascular negative cardio ROS Normal cardiovascular exam Rhythm:Regular Rate:Normal     Neuro/Psych negative neurological ROS  negative psych ROS   GI/Hepatic negative GI ROS, Neg liver ROS,   Endo/Other  negative endocrine ROS  Renal/GU negative Renal ROS  negative genitourinary   Musculoskeletal negative musculoskeletal ROS (+)   Abdominal   Peds negative pediatric ROS (+)  Hematology negative hematology ROS (+)   Anesthesia Other Findings   Reproductive/Obstetrics negative OB ROS                            Anesthesia Physical Anesthesia Plan  ASA: II  Anesthesia Plan: General   Post-op Pain Management:    Induction: Intravenous  Airway Management Planned: Oral ETT  Additional Equipment:   Intra-op Plan:   Post-operative Plan: Extubation in OR  Informed Consent: I have reviewed the patients History and Physical, chart, labs and discussed the procedure including the risks, benefits and alternatives for the proposed anesthesia with the patient or authorized representative who has indicated his/her understanding and acceptance.   Dental advisory given  Plan Discussed with: CRNA and Surgeon  Anesthesia Plan Comments:         Anesthesia Quick Evaluation

## 2016-05-12 NOTE — Transfer of Care (Signed)
Immediate Anesthesia Transfer of Care Note  Patient: Amber Bird  Procedure(s) Performed: Procedure(s): LAPAROSCOPIC CHOLECYSTECTOMY SINGLE SITE WITH INTRAOPERATIVE CHOLANGIOGRAM (N/A)  Patient Location: PACU  Anesthesia Type:General  Level of Consciousness:  sedated, patient cooperative and responds to stimulation  Airway & Oxygen Therapy:Patient Spontanous Breathing and Patient connected to face mask oxgen  Post-op Assessment:  Report given to PACU RN and Post -op Vital signs reviewed and stable  Post vital signs:  Reviewed and stable  Last Vitals:  Vitals:   05/12/16 1219  BP: 127/72  Pulse: 91  Resp: 16  Temp: 12.1 C    Complications: No apparent anesthesia complications

## 2016-05-12 NOTE — Anesthesia Postprocedure Evaluation (Addendum)
Anesthesia Post Note  Patient: Amber Bird  Procedure(s) Performed: Procedure(s) (LRB): LAPAROSCOPIC CHOLECYSTECTOMY SINGLE SITE WITH INTRAOPERATIVE CHOLANGIOGRAM (N/A)  Patient location during evaluation: PACU Anesthesia Type: General Level of consciousness: awake and alert Pain management: pain level controlled Vital Signs Assessment: post-procedure vital signs reviewed and stable Respiratory status: spontaneous breathing, nonlabored ventilation, respiratory function stable and patient connected to nasal cannula oxygen Cardiovascular status: blood pressure returned to baseline and stable Postop Assessment: no signs of nausea or vomiting Anesthetic complications: no       Last Vitals:  Vitals:   05/12/16 1930 05/12/16 1945  BP: 121/86 119/71  Pulse: 64 63  Resp: 16 17  Temp:      Last Pain:  Vitals:   05/12/16 1945  TempSrc:   PainSc: [place] [name]

## 2016-05-12 NOTE — Discharge Instructions (Signed)
LAPAROSCOPIC SURGERY: POST OP INSTRUCTIONS ° °###################################################################### ° °EAT °Gradually transition to a high fiber diet with a fiber supplement over the next few weeks after discharge.  Start with a pureed / full liquid diet (see below) ° °WALK °Walk an hour a day.  Control your pain to do that.   ° °CONTROL PAIN °Control pain so that you can walk, sleep, tolerate sneezing/coughing, go up/down stairs. ° °HAVE A BOWEL MOVEMENT DAILY °Keep your bowels regular to avoid problems.  OK to try a laxative to override constipation.  OK to use an antidairrheal to slow down diarrhea.  Call if not better after 2 tries ° °CALL IF YOU HAVE PROBLEMS/CONCERNS °Call if you are still struggling despite following these instructions. °Call if you have concerns not answered by these instructions ° °###################################################################### ° ° ° °1. DIET: Follow a light bland diet the first 24 hours after arrival home, such as soup, liquids, crackers, etc.  Be sure to include lots of fluids daily.  Avoid fast food or heavy meals as your are more likely to get nauseated.  Eat a low fat the next few days after surgery.   °2. Take your usually prescribed home medications unless otherwise directed. °3. PAIN CONTROL: °a. Pain is best controlled by a usual combination of three different methods TOGETHER: °i. Ice/Heat °ii. Over the counter pain medication °iii. Prescription pain medication °b. Most patients will experience some swelling and bruising around the incisions.  Ice packs or heating pads (30-60 minutes up to 6 times a day) will help. Use ice for the first few days to help decrease swelling and bruising, then switch to heat to help relax tight/sore spots and speed recovery.  Some people prefer to use ice alone, heat alone, alternating between ice & heat.  Experiment to what works for you.  Swelling and bruising can take several weeks to resolve.   °c. It is  helpful to take an over-the-counter pain medication regularly for the first few weeks.  Choose one of the following that works best for you: °i. Naproxen (Aleve, etc)  Two 220mg tabs twice a day °ii. Ibuprofen (Advil, etc) Three 200mg tabs four times a day (every meal & bedtime) °iii. Acetaminophen (Tylenol, etc) 500-650mg four times a day (every meal & bedtime) °d. A  prescription for pain medication (such as oxycodone, hydrocodone, etc) should be given to you upon discharge.  Take your pain medication as prescribed.  °i. If you are having problems/concerns with the prescription medicine (does not control pain, nausea, vomiting, rash, itching, etc), please call us [phone] to see if we need to switch you to a different pain medicine that will work better for you and/or control your side effect better. °ii. If you need a refill on your pain medication, please contact your pharmacy.  They will contact our office to request authorization. Prescriptions will not be filled after 5 pm or on week-ends. °4. Avoid getting constipated.  Between the surgery and the pain medications, it is common to experience some constipation.  Increasing fluid intake and taking a fiber supplement (such as Metamucil, Citrucel, FiberCon, MiraLax, etc) 1-2 times a day regularly will usually help prevent this problem from occurring.  A mild laxative (prune juice, Milk of Magnesia, MiraLax, etc) should be taken according to package directions if there are no bowel movements after 48 hours.   °5. Watch out for diarrhea.  If you have many loose bowel movements, simplify your diet to bland foods & liquids for   a few days.  Stop any stool softeners and decrease your fiber supplement.  Switching to mild anti-diarrheal medications (Kayopectate, Pepto Bismol) can help.  If this worsens or does not improve, please call us. °6. Wash / shower every day.  You may shower over the dressings as they are waterproof.  Continue to shower over incision(s)  after the dressing is off. °7. Remove your waterproof bandages 5 days after surgery.  You may leave the incision open to air.  You may replace a dressing/Band-Aid to cover the incision for comfort if you wish.  °8. ACTIVITIES as tolerated:   °a. You may resume regular (light) daily activities beginning the next day--such as daily self-care, walking, climbing stairs--gradually increasing activities as tolerated.  If you can walk 30 minutes without difficulty, it is safe to try more intense activity such as jogging, treadmill, bicycling, low-impact aerobics, swimming, etc. °b. Save the most intensive and strenuous activity for last such as sit-ups, heavy lifting, contact sports, etc  Refrain from any heavy lifting or straining until you are off narcotics for pain control.   °c. DO NOT PUSH THROUGH PAIN.  Let pain be your guide: If it hurts to do something, don't do it.  Pain is your body warning you to avoid that activity for another week until the pain goes down. °d. You may drive when you are no longer taking prescription pain medication, you can comfortably wear a seatbelt, and you can safely maneuver your car and apply brakes. °e. You may have sexual intercourse when it is comfortable.  °9. FOLLOW UP in our office °a. Please call [facility] at [phone] to set up an appointment to see your surgeon in the office for a follow-up appointment approximately 2-3 weeks after your surgery. °b. Make sure that you call for this appointment the day you arrive home to insure a convenient appointment time. °10. IF YOU HAVE DISABILITY OR FAMILY LEAVE FORMS, BRING THEM TO THE OFFICE FOR PROCESSING.  DO NOT GIVE THEM TO YOUR DOCTOR. ° ° °WHEN TO CALL US [phone]: °1. Poor pain control °2. Reactions / problems with new medications (rash/itching, nausea, etc)  °3. Fever over 101.5 F (38.5 C) °4. Inability to urinate °5. Nausea and/or vomiting °6. Worsening swelling or bruising °7. Continued bleeding from incision. °8. Increased  pain, redness, or drainage from the incision ° ° The clinic staff is available to answer your questions during regular business hours (8:30am-5pm).  Please don’t hesitate to call and ask to speak to one of our nurses for clinical concerns.  ° If you have a medical emergency, go to the nearest emergency room or call 911. ° A surgeon from [facility] is always on call at the hospitals ° ° °[facility] °[address], [address], [place], [place]  [address] ? °MAIN: [phone] ? TOLL FREE: [phone] ?  °FAX [phone] °[email or web address] ° ° ° °Cholecystitis °Cholecystitis is inflammation of the gallbladder. It is often called a gallbladder attack. The gallbladder is a pear-shaped organ that lies beneath the liver on the right side of the body. The gallbladder stores bile, which is a fluid that helps the body to digest fats. If bile builds up in your gallbladder, your gallbladder becomes inflamed. This condition may occur suddenly (be acute). Repeat episodes of acute cholecystitis or prolonged episodes may lead to a long-term (chronic) condition. Cholecystitis is serious and it requires treatment. °What are the causes? °The most common cause of this   condition is gallstones. Gallstones can block the tube (duct) that carries bile out of your gallbladder. This causes bile to build up. Other causes of this condition include: °· Damage to the gallbladder due to a decrease in blood flow. °· Infections in the bile ducts. °· Scars or kinks in the bile ducts. °· Tumors in the liver, pancreas, or gallbladder. ° °What increases the risk? °This condition is more likely to develop in: °· People who have sickle cell disease. °· People who take birth control pills or use estrogen. °· People who have alcoholic liver disease. °· People who have liver cirrhosis. °· People who have their nutrition delivered through a vein (parenteral nutrition). °· People who do not eat or drink  (do fasting) for a long period of time. °· People who are obese. °· People who have rapid weight loss. °· People who are pregnant. °· People who have increased triglyceride levels. °· People who have pancreatitis. ° °What are the signs or symptoms? °Symptoms of this condition include: °· Abdominal pain, especially in the upper right area of the abdomen. °· Abdominal tenderness or bloating. °· Nausea. °· Vomiting. °· Fever. °· Chills. °· Yellowing of the skin and the whites of the eyes (jaundice). ° °How is this diagnosed? °This condition is diagnosed with a medical history and physical exam. You may also have other tests, including: °· Imaging tests, such as: °? An ultrasound of the gallbladder. °? A CT scan of the abdomen. °? A gallbladder nuclear scan (HIDA scan). This scan allows your health care provider to see the bile moving from your liver to your gallbladder and to your small intestine. °? MRI. °· Blood tests, such as: °? A complete blood count, because the white blood cell count may be higher than normal. °? Liver function tests, because some levels may be higher than normal with certain types of gallstones. ° °How is this treated? °Treatment may include: °· Fasting for a certain amount of time. °· IV fluids. °· Medicine to treat pain or vomiting. °· Antibiotic medicine. °· Surgery to remove your gallbladder (cholecystectomy). This may happen immediately or at a later time. ° °Follow these instructions at home: °Home care will depend on your treatment. In general: °· Take over-the-counter and prescription medicines only as told by your health care provider. °· If you were prescribed an antibiotic medicine, take it as told by your health care provider. Do not stop taking the antibiotic even if you start to feel better. °· Follow instructions from your health care provider about what to eat or drink. When you are allowed to eat, avoid eating or drinking anything that triggers your symptoms. °· Keep all  follow-up visits as told by your health care provider. This is important. ° °Contact a health care provider if: °· Your pain is not controlled with medicine. °· You have a fever. °Get help right away if: °· Your pain moves to another part of your abdomen or to your back. °· You continue to have symptoms or you develop new symptoms even with treatment. °This information is not intended to replace advice given to you by your health care provider. Make sure you discuss any questions you have with your health care provider. °Document Released: 01/18/2005 Document Revised: 05/29/2015 Document Reviewed: 05/01/2014 °Elsevier Interactive Patient Education © 2017 [employer] ° °

## 2016-05-12 NOTE — Progress Notes (Signed)
Patient discharged per Dr. Johney Maine orders.  Patient dressed self and ambulated to wheelchair without assistance. AVS reviewed and opportunities for questions given; all answered satisfactorily according to patient.  Prescription for Ultram given to patient per Dr. Johney Maine.  Patient discharged from PACU with stable vitals and minimal pain; transported to car via wheelchair; ambulated from wheelchair to car without event.  Neighbor driving patient home.  It was a pleasure to care for this sweet woman.

## 2016-05-12 NOTE — Anesthesia Procedure Notes (Signed)
Procedure Name: Intubation Date/Time: 05/12/2016 6:03 PM Performed by: Noralyn Pick D Pre-anesthesia Checklist: Patient identified, Emergency Drugs available, Suction available and Patient being monitored Patient Re-evaluated:Patient Re-evaluated prior to inductionOxygen Delivery Method: Circle system utilized Preoxygenation: Pre-oxygenation with 100% oxygen Intubation Type: IV induction Ventilation: Mask ventilation without difficulty Laryngoscope Size: Mac and 3 Grade View: Grade I Tube type: Oral Tube size: 7.5 mm Number of attempts: 1 Airway Equipment and Method: Stylet Placement Confirmation: ETT inserted through vocal cords under direct vision,  positive ETCO2 and breath sounds checked- equal and bilateral Secured at: 21 cm Tube secured with: Tape Dental Injury: Teeth and Oropharynx as per pre-operative assessment

## 2016-05-12 NOTE — Op Note (Signed)
05/12/2016  6:57 PM  PATIENT:  Amber Bird  71 y.o. female  Patient Care Team: No Pcp Per Patient as PCP - General ([place]) Michael Boston, MD as Consulting Physician (General Surgery) Daneil Dolin, MD as Consulting Physician (Gastroenterology) Cleon Gustin, MD as Consulting Physician (Urology)  PRE-OPERATIVE DIAGNOSIS:  Chronic cholecystitis  POST-OPERATIVE DIAGNOSIS:  Chronic cholecystitis  PROCEDURE:  Procedure(s): LAPAROSCOPIC CHOLECYSTECTOMY SINGLE SITE WITH INTRAOPERATIVE CHOLANGIOGRAM  SURGEON:  [name], MD  ASSISTANT: RN   ANESTHESIA:   local and general  EBL:  Total I/O In: 1000 [I.V.:1000] Out: 10 [Blood:10]  Delay start of Pharmacological VTE agent (>24hrs) due to surgical blood loss or risk of bleeding:  no  DRAINS: none   SPECIMEN:  Source of Specimen:  Gallbladder   DISPOSITION OF SPECIMEN:  PATHOLOGY  COUNTS:  YES  PLAN OF CARE: Discharge to home after PACU  PATIENT DISPOSITION:  PACU - hemodynamically stable.  INDICATION: Patient with postprandial dominant complaint suspicious for biliary colic.  Rest of the differential diagnosis seems unlikely.  Moderate sludge and dilated boggy gallbladder noted on studies.  I recommended cholecystectomy.  The anatomy & physiology of hepatobiliary & pancreatic function was discussed.  The pathophysiology of gallbladder dysfunction was discussed.  Natural history risks without surgery was discussed.   I feel the risks of no intervention will lead to serious problems that outweigh the operative risks; therefore, I recommended cholecystectomy to remove the pathology.  I explained laparoscopic techniques with possible need for an open approach.  Probable cholangiogram to evaluate the bilary tract was explained as well.    Risks such as bleeding, infection, abscess, leak, injury to other organs, need for further treatment, heart attack, death, and other risks were discussed.  I noted a good  likelihood this will help address the problem.  Possibility that this will not correct all abdominal symptoms was explained.  Goals of post-operative recovery were discussed as well.  We will work to minimize complications.  An educational handout further explaining the pathology and treatment options was given as well.  Questions were answered.  The patient expresses understanding & wishes to proceed with surgery.   OR FINDINGS: Very large boggy gallbladder with moderate adhesions markedly filled with paste and clusters of gallstones.  Consistent with chronic cholecystitis.  Narrowed cystic duct.  Clean and gram showing rather classic biliary anatomy without obstruction.  DESCRIPTION:   The patient was identified & brought in the operating room. The patient was positioned supine with arms tucked. SCDs were active during the entire case. The patient underwent general anesthesia without any difficulty.  The abdomen was prepped and draped in a sterile fashion. A Surgical Timeout confirmed our plan.  I made a transverse curvilinear incision through the superior umbilical fold.  I placed a 71mm long port through the supraumbilical fascia using a modified Hassan cutdown technique. I began carbon dioxide insufflation. Camera inspection revealed no injury. There were no adhesions to the anterior abdominal wall supraumbilically.  I proceeded to continue with single site technique. I placed a #5 port in left upper aspect of the wound. I placed a 5 mm atraumatic grasper in the right inferior aspect of the wound.  I turned attention to the right upper quadrant.  The gallbladder fundus was elevated cephalad.  I freed moderate adhesions of omentum, mesocolon, and duodenal bulb off the gallbladder to expose the infundibulum and entire gallbladder anatomy.  I freed the peritoneal coverings between the gallbladder and the liver  on the posteriolateral and anteriomedial walls. I alternated between Harmonic & blunt  [place] dissection to help get a good critical view of the cystic artery and cystic duct. I did further dissection to free a few centimeters of the  gallbladder off the liver bed to get a good critical view of the infundibulum and cystic duct. I mobilized the cystic artery; and, after getting a good 360 view, ligated the cystic artery using the Harmonic ultrasonic dissection. I skeletonized the cystic duct.  I placed a clip on the infundibulum. I did a partial cystic duct-otomy and ensured patency. I placed a 5 [place] cholangiocatheter through a puncture site at the right subcostal ridge of the abdominal wall and directed it into the cystic duct.  We ran a cholangiogram with dilute radio-opaque contrast and continuous fluoroscopy.  Contrast flowed from a side branch consistent with cystic duct cannulization. Contrast flowed up the common hepatic duct into the right and left intrahepatic chains out to secondary radicals. Contrast flowed down the common bile duct easily across the normal ampulla into the duodenum.  This was consistent with a normal cholangiogram.  I removed the cholangiocatheter. I placed clips on the cystic duct x4.  I completed cystic duct transection. I freed the gallbladder from its remaining attachments to the liver. I ensured hemostasis on the gallbladder fossa of the liver and elsewhere. I inspected the rest of the abdomen & detected no injury nor bleeding elsewhere.  I removed the gallbladder out the supraumbilical fascia. I closed the fascia transversely using #1 PDS interrupted stitches. I closed the skin using 4-0 monocryl stitch.  Sterile dressing was applied. The patient was extubated & arrived in the PACU in stable condition..  I had discussed postoperative care with the patient in the holding area. I am about to locate the patient's neighbor & friend and discuss operative findings and postoperative goals / instructions.  Instructions are written in the chart as  well.  [name], M.D., F.A.C.S. Gastrointestinal and Minimally Invasive Surgery [facility] [address], [place], [place] [address] [phone] Main / Paging

## 2016-05-13 ENCOUNTER — Encounter (HOSPITAL_COMMUNITY): Payer: Self-pay | Admitting: Surgery

## 2016-07-02 NOTE — Addendum Note (Signed)
Addendum  created 07/02/16 1241 by Kunal Levario D, MD   Sign clinical note    

## 2016-12-27 DIAGNOSIS — Z1231 Encounter for screening mammogram for malignant neoplasm of breast: Secondary | ICD-10-CM | POA: Diagnosis not present

## 2017-01-05 DIAGNOSIS — H00029 Hordeolum internum unspecified eye, unspecified eyelid: Secondary | ICD-10-CM | POA: Diagnosis not present

## 2017-02-03 ENCOUNTER — Other Ambulatory Visit: Payer: Self-pay

## 2017-02-03 ENCOUNTER — Ambulatory Visit (HOSPITAL_COMMUNITY)
Admission: RE | Admit: 2017-02-03 | Discharge: 2017-02-03 | Disposition: A | Discharge status: Home / Self Care | Payer: Medicare HMO | Source: Ambulatory Visit | Attending: Family Medicine | Admitting: Family Medicine

## 2017-02-03 ENCOUNTER — Encounter: Payer: Self-pay | Admitting: Family Medicine

## 2017-02-03 ENCOUNTER — Ambulatory Visit (INDEPENDENT_AMBULATORY_CARE_PROVIDER_SITE_OTHER): Payer: Medicare HMO | Admitting: Family Medicine

## 2017-02-03 VITALS — BP 110/60 | HR 102 | Temp 98.5°F | Resp 16 | Ht 65.0 in | Wt 163.8 lb

## 2017-02-03 DIAGNOSIS — R739 Hyperglycemia, unspecified: Secondary | ICD-10-CM

## 2017-02-03 DIAGNOSIS — E041 Nontoxic single thyroid nodule: Secondary | ICD-10-CM | POA: Diagnosis not present

## 2017-02-03 DIAGNOSIS — R918 Other nonspecific abnormal finding of lung field: Secondary | ICD-10-CM | POA: Diagnosis not present

## 2017-02-03 DIAGNOSIS — R0789 Other chest pain: Secondary | ICD-10-CM

## 2017-02-03 DIAGNOSIS — I517 Cardiomegaly: Secondary | ICD-10-CM | POA: Diagnosis not present

## 2017-02-03 DIAGNOSIS — Z780 Asymptomatic menopausal state: Secondary | ICD-10-CM | POA: Diagnosis not present

## 2017-02-03 DIAGNOSIS — J4520 Mild intermittent asthma, uncomplicated: Secondary | ICD-10-CM | POA: Diagnosis not present

## 2017-02-03 DIAGNOSIS — J479 Bronchiectasis, uncomplicated: Secondary | ICD-10-CM | POA: Diagnosis not present

## 2017-02-03 DIAGNOSIS — E785 Hyperlipidemia, unspecified: Secondary | ICD-10-CM

## 2017-02-03 MED ORDER — IPRATROPIUM BROMIDE 0.02 % IN SOLN
0.5000 mg | Freq: Once | RESPIRATORY_TRACT | Status: AC
  Administered 2017-02-03: 0.5 mg via RESPIRATORY_TRACT

## 2017-02-03 MED ORDER — ALBUTEROL SULFATE HFA 108 (90 BASE) MCG/ACT IN AERS: 2.0000 | INHALATION_SPRAY | Freq: Four times a day (QID) | RESPIRATORY_TRACT | 0 refills | Status: DC | PRN

## 2017-02-03 MED ORDER — ALBUTEROL SULFATE (2.5 MG/3ML) 0.083% IN NEBU
2.5000 mg | INHALATION_SOLUTION | Freq: Once | RESPIRATORY_TRACT | Status: AC
  Administered 2017-02-03: 2.5 mg via RESPIRATORY_TRACT

## 2017-02-03 NOTE — Addendum Note (Signed)
Addended by: Merceda Elks on: 02/03/2017 03:11 PM   Modules accepted: Orders

## 2017-02-03 NOTE — Addendum Note (Signed)
Addended by: Merceda Elks on: 02/03/2017 11:43 AM   Modules accepted: Orders

## 2017-02-03 NOTE — Progress Notes (Signed)
Patient ID: Amber Bird, female    DOB: [birth year], 72 y.o.   MRN: [id number]  Chief Complaint  Patient presents with  . Establish Care  . Shortness of Breath    had asthma in the past, thinks it is similar    Allergies Patient has no known allergies.  Subjective:   Amber Bird is a 72 y.o. female who presents to [facility] today.  HPI Amber Bird is here to establish care as a new patient visit.  She reports she was motivated to come in because she is concerned that she could have a problem with her breathing.  She reports that her. Dog just died on 02/22/2017, golden retreiver that she had for years. Reports that her dog was her companion b/c she has not family living or many friends. Reports that her mood has never been great and she does not believe in taking medications for mood. [place] medications for getting rid of all of their problems. Reports that she still has two cats.  Reports she was told her dog had lung cancer but she is concerned that this could be caused by possible mold in her modular home.  She has not seen any evidence of mold in her home but she wonders if it is there.  She reports that she had taken her dog to the vet multiple times for questionable breathing problems.  She comes in today because the day her dog died she reported that she started having tightness in her chest.  She is continued to have tightness in her chest since he died.  The feeling in her chest is not painful but a discomfort, per her report.  It is a 2-4 out of 10.  Has been constant but sometimes worsens.  There is no radiation of this feeling to other parts of her body.  There is no associated nausea, vomiting, sweating, jaw pain, palpitations, back pain.  She reports that she used to have a history of asthma which was worsened by emotional stress.  She has not had to use her inhaler for years since her divorce from her  husband.  She reports that she is concerned that this could be a problem with her chest and her asthma.  She reports that she would like a chest x-ray to make sure there is nothing wrong with her lungs.  She reports that she will not be able to rest her calm down because she knows this.  She reports that she does walk on her treadmill multiple times a day to get her exercise.  She reports that she gets no chest pain or problems with this and it actually helps to calm her down.  She reports that she is always been an anxious person with problems with her mood but she is who she is and she will absolutely not take any medications. She reports that she has been sneezing a lot and had watery eyes for several weeks.  Uses benadryl as needed. Eyes have been itcy and sore. Went to vision doctor about several weeks ago b/c this had been going on for a while, was told that needs to use hot compresses on eyes qid.  She reports she initially thought that his advice was a bunch of junk but it really helped.  She reports that she initially wondered if this feeling was because she was wearing a bra.  She reports she does not  like to wear a bra and that it makes her feel constricted and like she cannot breathe.  She reports that however even when she did not wear her bra that she had this feeling.  She denies a chronic cough.  No sputum production. Patient reports that she believes her asthma has been triggered at times from the cold.  She reports that when she wakes up if her house is cold she can start sneezing and has to get her house warmed up quickly or she will become symptomatic.  She reports that she just needs to know that her chest x-ray is okay and then she feels like she will be able to calm down.  She reports that she also has to take in her cats to get their chest x-rays and then she will feel much better.    Past Medical History:  Diagnosis Date  . Complication of anesthesia    "drop in blood pressure  years ago after hysterectomy "   . History of bleeding ulcers   . Multiple thyroid nodules   . Seasonal allergies     Past Surgical History:  Procedure Laterality Date  . ABDOMINAL HYSTERECTOMY     complete.   . COLONOSCOPY N/A 12/18/2015   Rourk: diverticulosis in sigmoid colon, otherwise normal   . Cyst removed from ovary  1973  . LAPAROSCOPIC CHOLECYSTECTOMY SINGLE SITE WITH INTRAOPERATIVE CHOLANGIOGRAM N/A 05/12/2016   Procedure: LAPAROSCOPIC CHOLECYSTECTOMY SINGLE SITE WITH INTRAOPERATIVE CHOLANGIOGRAM;  Surgeon: Michael Boston, MD;  Location: [facility];  Service: General;  Laterality: N/A;  . left hammer toe repair    . thyroid needle biopsy      Family History  Problem Relation Age of Onset  . Colon cancer Other   . Breast cancer Mother   . Heart disease Father      Social History   Socioeconomic History  . Marital status: Divorced    Spouse name: None  . Number of children: None  . Years of education: None  . Highest education level: None  Social Needs  . Financial resource strain: None  . Food insecurity - worry: None  . Food insecurity - inability: None  . Transportation needs - medical: None  . Transportation needs - non-medical: None  Occupational History  . None  Tobacco Use  . Smoking status: Never Smoker  . Smokeless tobacco: Never Used  Substance and Sexual Activity  . Alcohol use: No  . Drug use: No  . Sexual activity: None  Other Topics Concern  . None  Social History Narrative   Moved from [facility] 2009. In 2008 when the economy dropped lost job. Reports that after lost job was hard to get another job.  Is not married and does not have children. Used to enjoy horses. Reports that moved here b/c it was a lot cheaper and warmer. Had visited here for a horse event in 1990 so decided to move here. Lives in [place], [place]. Reports that made stupid financial decisions and is dealing with that at this time.     Review of Systems  Constitutional: Negative for  activity change, appetite change, chills, diaphoresis, fever and unexpected weight change.       Reports she is not very active other than walking on her treadmill.  She reports she likes to sit and watch TV for hours at a time.  HENT: Negative for dental problem, ear pain, facial swelling, mouth sores, nosebleeds, postnasal drip, sinus pressure, sinus pain, sore throat, trouble swallowing  and voice change.        Reports she has terrible teeth from having implants that then had issues with him.  Reports she cannot afford to go to the dentist but tries to take good care of her teeth.  Eyes: Positive for itching. Negative for visual disturbance.  Respiratory: Positive for chest tightness. Negative for apnea, cough, choking, wheezing and stridor.        Reports that the tight feeling in her chest makes her feel like she cannot take a deep breath.  No pain with breathing.  Cardiovascular: Negative for palpitations and leg swelling.  Gastrointestinal: Negative for abdominal pain, blood in stool, constipation, diarrhea, nausea and rectal pain.  Endocrine: Negative for polyphagia and polyuria.  Genitourinary: Negative for dysuria, frequency and urgency.  Musculoskeletal: Negative for arthralgias and neck stiffness.  Allergic/Immunologic: Positive for environmental allergies.  Neurological: Negative for dizziness, tremors, weakness, light-headedness and numbness.  Hematological: Negative for adenopathy. Does not bruise/bleed easily.  Psychiatric/Behavioral: Negative for agitation, decreased concentration, sleep disturbance and suicidal ideas.       Patient reports that she has always had some issues with her mood and can tend to be obsessive-compulsive and focus on lots of details.  She reports that she would never take any medications for her mood nor does she want any evaluation of her mood.  She reports that she is who she is.  She denies suicidal or homicidal ideations.  Denies any history of self  injury.  Does not feel depressed but going through grieving stage per her report for her dog.   Records from laboratory studies that were done at the time of her cholecystectomy were reviewed.  Objective:   BP 110/60 (BP Location: Left Arm, Patient Position: Sitting, Cuff Size: Normal)   Pulse (!) 102   Temp 98.5 F (36.9 C) (Other (Comment))   Resp 16   Ht 5\' 5"  (1.651 m)   Wt 163 lb 12 oz (74.3 kg)   SpO2 95%   BMI 27.25 kg/m   Physical Exam  Constitutional: She is oriented to person, place, and time. She appears well-developed and well-nourished. No distress.  Status post albuterol nebulizer breath sounds were clear and present.  There was no wheezing detected on examination before or after albuterol treatment.  Pulse recheck of 82, 86.  HENT:  Head: Normocephalic and atraumatic.  Mouth/Throat: Oropharynx is clear and moist.  Eyes: Pupils are equal, round, and reactive to light.  Neck: Normal range of motion. Neck supple. No JVD present. No thyromegaly present.  Cardiovascular: Normal rate, regular rhythm, normal heart sounds and intact distal pulses. Exam reveals no decreased pulses.  No murmur heard. Pulmonary/Chest: Effort normal and breath sounds normal. No respiratory distress. She has no decreased breath sounds. She has no wheezes. She exhibits tenderness. She exhibits no mass and no edema.  Abdominal: Soft. She exhibits no distension. There is no tenderness. There is no guarding.  Musculoskeletal:       Arms:      Right lower leg: Normal.       Left lower leg: Normal.  Patient reports she is slightly tender to palpation in the area noted.  Reports may be a 1-2 out of 10.  Reports this is may be but may be not similar to her chest tightness.  Lymphadenopathy:    She has no cervical adenopathy.  Neurological: She is alert and oriented to person, place, and time. No cranial nerve deficit.  Skin: Skin is warm and  dry. No rash noted. No cyanosis or erythema. No pallor.    Psychiatric: Her behavior is normal. Her mood appears anxious. Her affect is labile. Her speech is tangential. Cognition and memory are normal. She expresses no homicidal and no suicidal ideation. She expresses no homicidal plans.  Appropriate dress and grooming.  Mood somewhat labile.  Speech normal rate, rhythm, and prosody.  However thought processes somewhat tangential.  Patient very loquacious and hard to redirect and get her to answer questions.  No obvious auditory or visual hallucinations.  Nursing note and vitals reviewed.  EKG performed in office which reveals normal sinus rhythm.  Ventricular rate of 71 bpm.  Normal intervals.  No T wave inversion or ST segment elevation.  Normal EKG per my interpretation. Assessment and Plan  1. Chest tightness Patient with symptoms of chest tightness which have been for the past 4 days.  Symptoms starting after death of her pet.  Very concerned of mold and allergens in her home.  Patient with a history of asthma and questionable allergic symptoms.  However I do believe that there is an element of anxiety present.  EKG was performed in the office which was within normal limits.  I do not believe her symptoms are related to her heart.  She was counseled concerning worrisome signs and symptoms of cardiac chest pain and told if they develop to call 911.  She did have some improvement in her symptoms slightly with the albuterol treatment in the office.  She reports that she believes her symptoms will completely resolve when she finds out that her chest x-ray and her cats chest x-rays are within normal limits.  I told patient that I would call her with the results of her chest x-ray.  She does not want any allergy medications other than she reports she will continue to use her Benadryl as she sees she needs to.  She was agreeable to get a refill on her Proventil inhaler to use as needed.  She was counseled on the use.  I do believe some of her symptoms are related to  anxiety however she adamantly refuses any medication or evaluation or therapy for this. - DG Chest 2 View; Future - EKG 12-Lead  2. Elevated blood sugar Review of labs revealed elevated blood sugars.  Check labs at this time. - Basic metabolic panel - Hemoglobin A1c  3. Post-menopausal She defers use of calcium and vitamin D.  Check bone density.  Mammogram up-to-date. - DG Bone Density; Future  4. Hyperlipidemia, unspecified hyperlipidemia type Patient reports history of hyperlipidemia.  However I do not have lab results.  Check labs - Lipid panel  5. Mild intermittent asthma without complication Refill medication as directed. - albuterol (PROVENTIL HFA;VENTOLIN HFA) 108 (90 Base) MCG/ACT inhaler; Inhale 2 puffs into the lungs every 6 (six) hours as needed for wheezing or shortness of breath.  Dispense: 1 Inhaler; Refill: 0  6. Thyroid nodule Patient reports she has had thyroid ultrasounds and biopsies which were within normal limits.  However she was told that she needed to have her thyroid function monitored.  Check this today. - TSH  Return in about 4 weeks (around 03/03/2017) for follow up. [name], MD 02/03/2017

## 2017-02-03 NOTE — Patient Instructions (Signed)

## 2017-02-04 LAB — LIPID PANEL
Cholesterol: 256 mg/dL — ABNORMAL HIGH (ref <200)
HDL: 66 mg/dL (ref >50)
LDL Cholesterol (Calc): 160 mg/dL (calc) — ABNORMAL HIGH
Non-HDL Cholesterol (Calc): 190 mg/dL (calc) — ABNORMAL HIGH (ref <130)
Total CHOL/HDL Ratio: 3.9 (calc) (ref <5.0)
Triglycerides: 157 mg/dL — ABNORMAL HIGH (ref <150)

## 2017-02-04 LAB — BASIC METABOLIC PANEL
BUN: 9 mg/dL (ref 7–25)
CO2: 28 mmol/L (ref 20–32)
Calcium: 9.9 mg/dL (ref 8.6–10.4)
Chloride: 102 mmol/L (ref 98–110)
Creat: 0.89 mg/dL (ref 0.60–0.93)
Glucose, Bld: 111 mg/dL (ref 65–139)
Potassium: 4 mmol/L (ref 3.5–5.3)
Sodium: 138 mmol/L (ref 135–146)

## 2017-02-04 LAB — TSH: TSH: 1.13 mIU/L (ref 0.40–4.50)

## 2017-02-04 LAB — HEMOGLOBIN A1C
Hgb A1c MFr Bld: 5.5 % of total Hgb (ref <5.7)
Mean Plasma Glucose: 111 (calc)
eAG (mmol/L): 6.2 (calc)

## 2017-02-07 ENCOUNTER — Ambulatory Visit (HOSPITAL_COMMUNITY)
Admission: RE | Admit: 2017-02-07 | Discharge: 2017-02-07 | Disposition: A | Discharge status: Home / Self Care | Payer: Medicare HMO | Source: Ambulatory Visit | Attending: Family Medicine | Admitting: Family Medicine

## 2017-02-07 ENCOUNTER — Telehealth: Payer: Self-pay | Admitting: Family Medicine

## 2017-02-07 DIAGNOSIS — Z780 Asymptomatic menopausal state: Secondary | ICD-10-CM | POA: Insufficient documentation

## 2017-02-07 DIAGNOSIS — J189 Pneumonia, unspecified organism: Secondary | ICD-10-CM

## 2017-02-07 DIAGNOSIS — M8589 Other specified disorders of bone density and structure, multiple sites: Secondary | ICD-10-CM | POA: Diagnosis not present

## 2017-02-07 DIAGNOSIS — Z13820 Encounter for screening for osteoporosis: Secondary | ICD-10-CM | POA: Insufficient documentation

## 2017-02-07 NOTE — Telephone Encounter (Signed)
Patient is requesting a call back from Dr.Hagler to discuss chest xray and bone density results. Cb#: [phone]  If there is anything you can tell her today she said that will help a great deal with her anxiety.

## 2017-02-08 NOTE — Telephone Encounter (Signed)
Please call patient and advised that I receive the notes of her chest x-ray.  Chest x-ray was somewhat nonspecific but it was not completely normal.  It did show some evidence of questionable interstitial or possible chronic lung disease.  There was evidence of some scar wearing off from previous lung infection or lung disease.  In addition it was thought that her heart was slightly enlarged.  It would be best at this time if she would go see a pulmonologist for evaluation and to see if these are more acute or long-term findings on chest x-ray due to previous history of asthma and tobacco exposure.  There was no evidence of acute infection or mass in the lungs.  I do believe this warrants her to follow-up with the pulmonologist.  I understand that this is quite nonspecific but it is likely she will need some additional testing to get to the bottom of this.  I am happy for her to schedule appointment and we can talk about this in further detail.  However I do believe that she will need the evaluation before we to give her much more information.  Please advise her to keep her scheduled follow-up.

## 2017-02-08 NOTE — Telephone Encounter (Signed)
Patient informed of message below, verbalized understanding.  

## 2017-02-09 ENCOUNTER — Telehealth: Payer: Self-pay | Admitting: Family Medicine

## 2017-02-09 NOTE — Telephone Encounter (Signed)
Spoke to patient regarding labs per Dr. Mannie Stabile. See result note.

## 2017-02-09 NOTE — Telephone Encounter (Signed)
Pt is calling in --she stated she missed your call.

## 2017-02-15 ENCOUNTER — Ambulatory Visit (INDEPENDENT_AMBULATORY_CARE_PROVIDER_SITE_OTHER): Payer: Medicare HMO | Admitting: Pulmonary Disease

## 2017-02-15 ENCOUNTER — Encounter: Payer: Self-pay | Admitting: Pulmonary Disease

## 2017-02-15 VITALS — BP 112/66 | HR 89 | Ht 65.0 in | Wt 161.0 lb

## 2017-02-15 DIAGNOSIS — R062 Wheezing: Secondary | ICD-10-CM | POA: Diagnosis not present

## 2017-02-15 DIAGNOSIS — R0602 Shortness of breath: Secondary | ICD-10-CM | POA: Diagnosis not present

## 2017-02-15 NOTE — Patient Instructions (Signed)
Shortness of breath: I think that weight gain is the most likely cause. We will check a high-resolution CT scan of the chest because of the abnormal chest x-ray to ensure there is no evidence of an underlying lung disease Spirometry test today Ambulatory oximetry monitoring today Try to exercise more frequently  To help lose weight: The following behaviors have been associated with weight loss: Weigh yourself daily Keep a food diary Drink a glass of water prior to eating a meal Only eat when you are hungry Buy food from the periphery of the grocery store, not the middle  Wheezing: I think this is related to vocal cord dysfunction and gastroesophageal reflux Spirometry test today Keep taking pepcid   If you are still feeling short of breath despite exercising more frequently then you need to see me back, 2-3 months

## 2017-02-15 NOTE — Progress Notes (Signed)
Subjective:    Patient ID: [place], female    DOB: January 23, 1946, 72 y.o.   MRN: [id number]  Synopsis: Referred in January 2019 for an abnormal CXR and shortness of breath.  She is a lifelong non-smoker.  HPI Chief Complaint  Patient presents with  . Advice Only    Referred by Dr. Mannie Stabile for Pneumonitis.      [place] is here to see me for evaluation of shortness of breath.  She says that it has been progressive for the last 6 months or so.  She notes that she can exercise frequently on a treadmill and walks approximately 2-3 miles a day doing that.  She has to walk about half a mile at a time however because of some right hip pain.  When she is walking on the treadmill she has no shortness of breath.  However, when she is walking outside particularly climbing a hill she will feel more short of breath.  She will have associated chest tightness.  She says that this is sometimes associated with some wheezing..  This is also been associated with an increased weight gain over the last few years.  Specifically, she says she is gained probably 30 pounds since moving to [place].  She used to be quite active and her weight was down around 120-130.  During that time she had no problems with shortness of breath.  She denies problems with mucus production or chest congestion right now.  She does not cough.  She is also had increased gastroesophageal reflux disease with the weight gain over the years.  She says that around 130 pounds she had no problems with gastroesophageal reflux disease but it the heartburn and indigestion has worsened since getting up to 160.  She notes in the past she had "asthma".  Specifically she said she had upper airway wheezing which is exacerbated by certain foods and emotional stress in the past.  She said this lasted for a year after her divorce.  She is never smoked.  She is always worked in a [employer].  She is never had pneumonia.  She did have a  period where she had recurrent episodes of bronchitis as an adult.  She says that she does not cough or have chest congestion now.   Past Medical History:  Diagnosis Date  . Complication of anesthesia    "drop in blood pressure years ago after hysterectomy "   . History of bleeding ulcers   . Hypercholesteremia   . Multiple thyroid nodules   . Seasonal allergies      Family History  Problem Relation Age of Onset  . Colon cancer Other   . Breast cancer Mother   . Tuberculosis Mother   . Heart disease Father      Social History   Socioeconomic History  . Marital status: Divorced    Spouse name: Not on file  . Number of children: Not on file  . Years of education: Not on file  . Highest education level: Not on file  Social Needs  . Financial resource strain: Not on file  . Food insecurity - worry: Not on file  . Food insecurity - inability: Not on file  . Transportation needs - medical: Not on file  . Transportation needs - non-medical: Not on file  Occupational History  . Not on file  Tobacco Use  . Smoking status: Never Smoker  . Smokeless tobacco: Never Used  Substance and Sexual Activity  .  Alcohol use: No  . Drug use: No  . Sexual activity: Not on file  Other Topics Concern  . Not on file  Social History Narrative   Moved from [place] 2009. In 2008 when the economy dropped lost job. Reports that after lost job was hard to get another job.  Is not married and does not have children. Used to enjoy horses. Reports that moved here b/c it was a lot cheaper and warmer. Had visited here for a horse event in 1990 so decided to move here. Lives in [place], [place]. Reports that made stupid financial decisions and is dealing with that at this time.      No Known Allergies   Outpatient Medications Prior to Visit  Medication Sig Dispense Refill  . albuterol (PROVENTIL HFA;VENTOLIN HFA) 108 (90 Base) MCG/ACT inhaler Inhale 2 puffs into the lungs every 6 (six) hours as needed for  wheezing or shortness of breath. 1 Inhaler 0  . diphenhydrAMINE (BENADRYL) 25 MG tablet Take 25 mg by mouth every 6 (six) hours as needed.    . famotidine (PEPCID) 20 MG tablet Take 20 mg by mouth 2 (two) times daily.    . naproxen sodium (ALEVE) 220 MG tablet Take 220 mg by mouth.     No facility-administered medications prior to visit.       Review of Systems  Constitutional: Negative for fever and unexpected weight change.  HENT: Positive for sneezing. Negative for congestion, dental problem, ear pain, nosebleeds, postnasal drip, rhinorrhea, sinus pressure, sore throat and trouble swallowing.   Eyes: Negative for redness and itching.  Respiratory: Positive for shortness of breath and wheezing. Negative for cough and chest tightness.   Cardiovascular: Negative for palpitations and leg swelling.  Gastrointestinal: Negative for nausea and vomiting.  Genitourinary: Negative for dysuria.  Musculoskeletal: Negative for joint swelling.  Skin: Negative for rash.  Neurological: Negative for headaches.  Hematological: Does not bruise/bleed easily.  Psychiatric/Behavioral: Negative for dysphoric mood. The patient is not nervous/anxious.        Objective:   Physical Exam  Vitals:   02/15/17 1608  BP: 112/66  Pulse: 89  SpO2: 97%  Weight: 161 lb (73 kg)  Height: 5\' 5"  (1.651 m)  RA   Gen: well appearing, no acute distress HENT: NCAT, OP clear, neck supple without masses Eyes: PERRL, EOMi Lymph: no cervical lymphadenopathy PULM: CTA B CV: RRR, no mgr, no JVD GI: BS+, soft, nontender, no hsm Derm: no rash or skin breakdown MSK: normal bulk and tone Neuro: A&Ox4, CN II-XII intact, strength 5/5 in all 4 extremities Psyche: normal mood and affect   Chest x-ray: 2019 chest x-ray images independently reviewed showing what appears to be some airway thickening in the bases bilaterally no other obvious change.  CBC    Component Value Date/Time   WBC 6.3 05/06/2016 1145   RBC  4.74 05/06/2016 1145   HGB 14.1 05/06/2016 1145   HCT 41.3 05/06/2016 1145   PLT 281 05/06/2016 1145   MCV 87.1 05/06/2016 1145   MCH 29.7 05/06/2016 1145   MCHC 34.1 05/06/2016 1145   RDW 13.0 05/06/2016 1145        Assessment & Plan:    Wheezing  Shortness of breath - Plan: CT Chest High Resolution, Spirometry with Graph  Discussion: Charie has a subjective dyspnea with little objective abnormality on physical exam and vital signs.  Chest x-ray did show some airway thickening but no clear underlying lung disease.  I explained to her  that the differential diagnosis of dyspnea is broad and includes cardiac disease, lung disease, neuromuscular weakness, and anemia.  I also think deconditioning and being overweight are at play here.  She did have recurrent episodes of bronchitis as an adult so I suppose she could have bronchiectasis as described by the radiologist, though she has no other clinical features to suggest this.  I think it is also very likely that she has vocal cord dysfunction as she has experienced intermittent wheezing and shortness of breath during stressful times in years past.  Gastroesophageal reflux disease from weight gain is likely making this worse.  I explained to her that I think the best approach moving forward is to assess for an underlying lung problem with lung function testing, ambulatory oximetry monitoring, and a high-resolution CT scan of the chest.  If these are all normal then I will encourage her to lose weight through monitoring her diet and exercising more frequently.  However, if she continues to have shortness of breath despite increased exercise and weight loss then we could consider a cardiac workup.  Plan: Shortness of breath: I think that weight gain is the most likely cause. We will check a high-resolution CT scan of the chest because of the abnormal chest x-ray to ensure there is no evidence of an underlying lung disease Spirometry test  today Ambulatory oximetry monitoring today Try to exercise more frequently  To help lose weight: The following behaviors have been associated with weight loss: Weigh yourself daily Keep a food diary Drink a glass of water prior to eating a meal Only eat when you are hungry Buy food from the periphery of the grocery store, not the middle  Wheezing: I think this is related to vocal cord dysfunction and gastroesophageal reflux Spirometry test today Keep taking pepcid   If you are still feeling short of breath despite exercising more frequently then you need to see me back   Current Outpatient Medications:  .  albuterol (PROVENTIL HFA;VENTOLIN HFA) 108 (90 Base) MCG/ACT inhaler, Inhale 2 puffs into the lungs every 6 (six) hours as needed for wheezing or shortness of breath., Disp: 1 Inhaler, Rfl: 0 .  diphenhydrAMINE (BENADRYL) 25 MG tablet, Take 25 mg by mouth every 6 (six) hours as needed., Disp: , Rfl:  .  famotidine (PEPCID) 20 MG tablet, Take 20 mg by mouth 2 (two) times daily., Disp: , Rfl:  .  naproxen sodium (ALEVE) 220 MG tablet, Take 220 mg by mouth., Disp: , Rfl:

## 2017-02-17 ENCOUNTER — Ambulatory Visit: Payer: Medicare HMO | Admitting: Family Medicine

## 2017-02-21 DIAGNOSIS — L851 Acquired keratosis [keratoderma] palmaris et plantaris: Secondary | ICD-10-CM | POA: Diagnosis not present

## 2017-02-21 DIAGNOSIS — M205X2 Other deformities of toe(s) (acquired), left foot: Secondary | ICD-10-CM | POA: Diagnosis not present

## 2017-02-21 DIAGNOSIS — M2042 Other hammer toe(s) (acquired), left foot: Secondary | ICD-10-CM | POA: Diagnosis not present

## 2017-02-21 DIAGNOSIS — M79675 Pain in left toe(s): Secondary | ICD-10-CM | POA: Diagnosis not present

## 2017-02-22 ENCOUNTER — Other Ambulatory Visit: Payer: Self-pay | Admitting: Urology

## 2017-02-22 DIAGNOSIS — Q6211 Congenital occlusion of ureteropelvic junction: Principal | ICD-10-CM

## 2017-02-22 DIAGNOSIS — Q6239 Other obstructive defects of renal pelvis and ureter: Secondary | ICD-10-CM

## 2017-02-23 ENCOUNTER — Other Ambulatory Visit: Payer: Medicare HMO

## 2017-02-23 ENCOUNTER — Ambulatory Visit (INDEPENDENT_AMBULATORY_CARE_PROVIDER_SITE_OTHER)
Admission: RE | Admit: 2017-02-23 | Discharge: 2017-02-23 | Disposition: A | Discharge status: Home / Self Care | Payer: Medicare HMO | Source: Ambulatory Visit | Attending: Pulmonary Disease | Admitting: Pulmonary Disease

## 2017-02-23 DIAGNOSIS — Z0100 Encounter for examination of eyes and vision without abnormal findings: Secondary | ICD-10-CM | POA: Diagnosis not present

## 2017-02-23 DIAGNOSIS — R0602 Shortness of breath: Secondary | ICD-10-CM

## 2017-02-23 DIAGNOSIS — H5203 Hypermetropia, bilateral: Secondary | ICD-10-CM | POA: Diagnosis not present

## 2017-02-24 ENCOUNTER — Telehealth: Payer: Self-pay | Admitting: Pulmonary Disease

## 2017-02-24 NOTE — Telephone Encounter (Signed)
Spoke with pt, advised her CT was back but has not been seen by BQ. She stated she was really nervous about the results. I expressed my understanding and will forward to BQ to review.

## 2017-02-25 NOTE — Telephone Encounter (Signed)
Spoke with patient. She has already been notified of her results.   Nothing further needed at time of call.

## 2017-02-25 NOTE — Telephone Encounter (Signed)
Result note sent to [facility]

## 2017-02-28 ENCOUNTER — Ambulatory Visit (HOSPITAL_COMMUNITY): Payer: Medicare HMO

## 2017-03-01 ENCOUNTER — Encounter: Payer: Self-pay | Admitting: Pulmonary Disease

## 2017-03-01 ENCOUNTER — Other Ambulatory Visit (INDEPENDENT_AMBULATORY_CARE_PROVIDER_SITE_OTHER): Payer: Medicare HMO

## 2017-03-01 ENCOUNTER — Ambulatory Visit (INDEPENDENT_AMBULATORY_CARE_PROVIDER_SITE_OTHER): Payer: Medicare HMO | Admitting: Pulmonary Disease

## 2017-03-01 VITALS — BP 126/68 | HR 89 | Ht 65.0 in | Wt 160.8 lb

## 2017-03-01 DIAGNOSIS — R9389 Abnormal findings on diagnostic imaging of other specified body structures: Secondary | ICD-10-CM

## 2017-03-01 DIAGNOSIS — Z01818 Encounter for other preprocedural examination: Secondary | ICD-10-CM

## 2017-03-01 DIAGNOSIS — R0600 Dyspnea, unspecified: Secondary | ICD-10-CM

## 2017-03-01 DIAGNOSIS — R072 Precordial pain: Secondary | ICD-10-CM | POA: Diagnosis not present

## 2017-03-01 LAB — CBC WITH DIFFERENTIAL/PLATELET
Basophils Absolute: 0 10*3/uL (ref 0.0–0.1)
Basophils Relative: 0.8 % (ref 0.0–3.0)
Eosinophils Absolute: 0.1 10*3/uL (ref 0.0–0.7)
Eosinophils Relative: 1.4 % (ref 0.0–5.0)
HCT: 41.9 % (ref 36.0–46.0)
Hemoglobin: 14.2 g/dL (ref 12.0–15.0)
Lymphocytes Relative: 13 % (ref 12.0–46.0)
Lymphs Abs: 0.8 10*3/uL (ref 0.7–4.0)
MCHC: 33.8 g/dL (ref 30.0–36.0)
MCV: 88.9 fl (ref 78.0–100.0)
Monocytes Absolute: 0.5 10*3/uL (ref 0.1–1.0)
Monocytes Relative: 7.9 % (ref 3.0–12.0)
Neutro Abs: 4.4 10*3/uL (ref 1.4–7.7)
Neutrophils Relative %: 76.9 % (ref 43.0–77.0)
Platelets: 295 10*3/uL (ref 150.0–400.0)
RBC: 4.72 Mil/uL (ref 3.87–5.11)
RDW: 13.8 % (ref 11.5–15.5)
WBC: 5.8 10*3/uL (ref 4.0–10.5)

## 2017-03-01 NOTE — Patient Instructions (Signed)
Abnormal CT scan of the chest: I worry that this is either due to a mold exposure or sarcoidosis You need to have a bronchoscopy Prior to the bronchoscopy we need to get an echocardiogram to assess for possible pulmonary hypertension We will get a CBC today, this is a blood test to make sure you are not at excessive bleeding risk for that procedure We will also check something called a hypersensitivity pneumonitis panel to look for evidence of mold exposure  Neck and abdominal pain with coronary calcification: We will refer you to cardiology for further evaluation  We will see you back in about 6-8 weeks

## 2017-03-01 NOTE — Progress Notes (Signed)
Subjective:    Patient ID: [place], female    DOB: 1945/09/19, 72 y.o.   MRN: [id number]  Synopsis: Referred in January 2019 for an abnormal CXR and shortness of breath.  She is a lifelong non-smoker.  HRCT showed findings concerning for granulomatous disease in the lung.  HPI Chief Complaint  Patient presents with  . Follow-up    review CT from 02/23/17.  pt does note increased cough since last OV.      [name] has been really stressed out lately.  She is concerned about mold exposure and she says that there is a lot of mold in her house.  She had her house inspected by a company who told her that the air quality was OK, but she notes that she feels worse when the heat is on.  She hasn't seen the mold, but she suspects it because her dog died in the house.  She says that she has been sneezing more lately for the last year and is not taking benadryl which helps.  She thinks this means that there must be mold I nthe house.  She did by a mold kit.  She also says that once she had a severe headache and ended up having an MRI and was told she had an AVM.  Chest pain: She denies recent chest pain but she said last night after exercise she had the sudden onset of some sharp right-sided neck pain as well as some belly pain.  She took aspirin and has not wanted to exercise since then.  She is quite concerned about the calcium seen in her cardiac arteries on the CT scan.  Past Medical History:  Diagnosis Date  . Complication of anesthesia    "drop in blood pressure years ago after hysterectomy "   . History of bleeding ulcers   . Hypercholesteremia   . Multiple thyroid nodules   . Seasonal allergies      Family History  Problem Relation Age of Onset  . Colon cancer Other   . Breast cancer Mother   . Tuberculosis Mother   . Heart disease Father         Review of Systems  Constitutional: Negative for fever and unexpected weight change.  HENT: Positive for sneezing. Negative  for congestion, dental problem, ear pain, nosebleeds, postnasal drip, rhinorrhea, sinus pressure, sore throat and trouble swallowing.   Eyes: Negative for redness and itching.  Respiratory: Positive for shortness of breath and wheezing. Negative for cough and chest tightness.   Cardiovascular: Negative for palpitations and leg swelling.  Gastrointestinal: Negative for nausea and vomiting.  Genitourinary: Negative for dysuria.  Musculoskeletal: Negative for joint swelling.  Skin: Negative for rash.  Neurological: Negative for headaches.  Hematological: Does not bruise/bleed easily.  Psychiatric/Behavioral: Negative for dysphoric mood. The patient is not nervous/anxious.        Objective:   Physical Exam  Vitals:   03/01/17 1448  BP: 126/68  Pulse: 89  SpO2: 98%  Weight: 160 lb 12.8 oz (72.9 kg)  Height: _0  (1.651 m)  RA  Gen: well appearing HENT: OP clear, TM's clear, neck supple PULM: CTA B, normal percussion CV: RRR, no mgr, trace edema GI: BS+, soft, nontender Derm: no cyanosis or rash Psyche: anxious but otherwise normal mood and affect   Chest x-ray: 2019 chest x-ray images independently reviewed showing what appears to be some airway thickening in the bases bilaterally no other obvious change. 02/2017 HRCT images independently reviewed:  nodular infiltrate in a bronchovascular distribution, upper lobe predominant, no significant lymphadenopathy in peri-hilar or paratracheal areas, though some calcified mediastinal lymph nodes noted (not enlarged); partial collapse of the RML, PA trunk enlarged worrisome for pulmonary hypertension  PFT: 02/2017 Spirometry: normal   CBC    Component Value Date/Time   WBC 6.3 05/06/2016 1145   RBC 4.74 05/06/2016 1145   HGB 14.1 05/06/2016 1145   HCT 41.3 05/06/2016 1145   PLT 281 05/06/2016 1145   MCV 87.1 05/06/2016 1145   MCH 29.7 05/06/2016 1145   MCHC 34.1 05/06/2016 1145   RDW 13.0 05/06/2016 1145        Assessment &  Plan:    Pre-procedural examination - Plan: CBC w/Diff  Dyspnea, unspecified type - Plan: ECHOCARDIOGRAM COMPLETE, Hypersensitivity pnuemonitis profile  Abnormal CT of the chest  Precordial pain  Discussion: Chaquetta had the surprising finding of upper lobe predominant bronchovascular distributed nodules.  The differential diagnosis includes sarcoidosis versus some sort of mold exposure.  She is quite convinced that there is a mold in her house and to be honest the CT scan findings could be consistent with prior mold exposure.  Her exposure history in the past also predisposes her to MAI given all the time she spent outside.  The best approach moving forward to assess the lung findings would be to perform a bronchoscopy with biopsy.  I am a bit hesitant to perform a biopsy however until have seen the results of an echocardiogram as the CT suggested some pulmonary hypertension.  At a minimum we could safely perform a BAL but prior to bronchoscopy with biopsy I would like to know what her PA pressure is.  She is also had some atypical neck and abdominal pain which she is concerned could be a cardiac problem.  She is quite nervous about this so I think the best approach is just to send her to cardiology to sort it out.  She may need to have a gated CT scan.  Overall I think her symptoms are not consistent with ischemia.  Plan: Abnormal CT scan of the chest: I worry that this is either due to a mold exposure or sarcoidosis You need to have a bronchoscopy Prior to the bronchoscopy we need to get an echocardiogram to assess for possible pulmonary hypertension We will get a CBC today, this is a blood test to make sure you are not at excessive bleeding risk for that procedure We will also check something called a hypersensitivity pneumonitis panel to look for evidence of mold exposure  Neck and abdominal pain with coronary calcification: We will refer you to cardiology for further evaluation  We  will see you back in about 6-8 weeks   Current Outpatient Medications:  .  albuterol (PROVENTIL HFA;VENTOLIN HFA) 108 (90 Base) MCG/ACT inhaler, Inhale 2 puffs into the lungs every 6 (six) hours as needed for wheezing or shortness of breath., Disp: 1 Inhaler, Rfl: 0 .  diphenhydrAMINE (BENADRYL) 25 MG tablet, Take 25 mg by mouth every 6 (six) hours as needed., Disp: , Rfl:  .  famotidine (PEPCID) 20 MG tablet, Take 20 mg by mouth 2 (two) times daily., Disp: , Rfl:  .  naproxen sodium (ALEVE) 220 MG tablet, Take 220 mg by mouth., Disp: , Rfl:

## 2017-03-03 ENCOUNTER — Other Ambulatory Visit: Payer: Self-pay

## 2017-03-03 ENCOUNTER — Encounter: Payer: Self-pay | Admitting: Family Medicine

## 2017-03-03 ENCOUNTER — Ambulatory Visit (INDEPENDENT_AMBULATORY_CARE_PROVIDER_SITE_OTHER): Payer: Medicare HMO | Admitting: Family Medicine

## 2017-03-03 ENCOUNTER — Telehealth: Payer: Self-pay | Admitting: Family Medicine

## 2017-03-03 VITALS — BP 110/68 | HR 84 | Temp 98.6°F | Resp 16 | Ht 65.0 in | Wt 160.8 lb

## 2017-03-03 DIAGNOSIS — Z79899 Other long term (current) drug therapy: Secondary | ICD-10-CM | POA: Diagnosis not present

## 2017-03-03 DIAGNOSIS — Z1159 Encounter for screening for other viral diseases: Secondary | ICD-10-CM

## 2017-03-03 DIAGNOSIS — Z9109 Other allergy status, other than to drugs and biological substances: Secondary | ICD-10-CM | POA: Diagnosis not present

## 2017-03-03 DIAGNOSIS — Z23 Encounter for immunization: Secondary | ICD-10-CM | POA: Diagnosis not present

## 2017-03-03 DIAGNOSIS — J302 Other seasonal allergic rhinitis: Secondary | ICD-10-CM

## 2017-03-03 DIAGNOSIS — R7989 Other specified abnormal findings of blood chemistry: Secondary | ICD-10-CM | POA: Diagnosis not present

## 2017-03-03 DIAGNOSIS — J189 Pneumonia, unspecified organism: Secondary | ICD-10-CM

## 2017-03-03 MED ORDER — ATORVASTATIN CALCIUM 20 MG PO TABS: 20.0000 mg | ORAL_TABLET | Freq: Every day | ORAL | 3 refills | Status: DC

## 2017-03-03 MED ORDER — CETIRIZINE HCL 10 MG PO TABS: 10.0000 mg | ORAL_TABLET | Freq: Every day | ORAL | 11 refills | Status: DC

## 2017-03-03 NOTE — Patient Instructions (Signed)
Fat and Cholesterol Restricted Diet Getting too much fat and cholesterol in your diet may cause health problems. Following this diet helps keep your fat and cholesterol at normal levels. This can keep you from getting sick. What types of fat should I choose?  Choose monosaturated and polyunsaturated fats. These are found in foods such as olive oil, canola oil, flaxseeds, walnuts, almonds, and seeds.  Eat more omega-3 fats. Good choices include salmon, mackerel, sardines, tuna, flaxseed oil, and ground flaxseeds.  Limit saturated fats. These are in animal products such as meats, butter, and cream. They can also be in plant products such as palm oil, palm kernel oil, and coconut oil.  Avoid foods with partially hydrogenated oils in them. These contain trans fats. Examples of foods that have trans fats are stick margarine, some tub margarines, cookies, crackers, and other baked goods. What general guidelines do I need to follow?  Check food labels. Look for the words "trans fat" and "saturated fat."  When preparing a meal: ? Fill half of your plate with vegetables and green salads. ? Fill one fourth of your plate with whole grains. Look for the word "whole" as the first word in the ingredient list. ? Fill one fourth of your plate with lean protein foods.  Eat more foods that have fiber, like apples, carrots, beans, peas, and barley.  Eat more home-cooked foods. Eat less at restaurants and buffets.  Limit or avoid alcohol.  Limit foods high in starch and sugar.  Limit fried foods.  Cook foods without frying them. Baking, boiling, grilling, and broiling are all great options.  Lose weight if you are overweight. Losing even a small amount of weight can help your overall health. It can also help prevent diseases such as diabetes and heart disease. What foods can I eat? Grains Whole grains, such as whole wheat or whole grain breads, crackers, cereals, and pasta. Unsweetened oatmeal,  bulgur, barley, quinoa, or brown rice. Corn or whole wheat flour tortillas. Vegetables Fresh or frozen vegetables (raw, steamed, roasted, or grilled). Green salads. Fruits All fresh, canned (in natural juice), or frozen fruits. Meat and Other Protein Products Ground beef (85% or leaner), grass-fed beef, or beef trimmed of fat. Skinless chicken or turkey. Ground chicken or turkey. Pork trimmed of fat. All fish and seafood. Eggs. Dried beans, peas, or lentils. Unsalted nuts or seeds. Unsalted canned or dry beans. Dairy Low-fat dairy products, such as skim or 1% milk, 2% or reduced-fat cheeses, low-fat ricotta or cottage cheese, or plain low-fat yogurt. Fats and Oils Tub margarines without trans fats. Light or reduced-fat mayonnaise and salad dressings. Avocado. Olive, canola, sesame, or safflower oils. Natural peanut or almond butter (choose ones without added sugar and oil). The items listed above may not be a complete list of recommended foods or beverages. Contact your dietitian for more options. What foods are not recommended? Grains White bread. White pasta. White rice. Cornbread. Bagels, pastries, and croissants. Crackers that contain trans fat. Vegetables White potatoes. Corn. Creamed or fried vegetables. Vegetables in a cheese sauce. Fruits Dried fruits. Canned fruit in light or heavy syrup. Fruit juice. Meat and Other Protein Products Fatty cuts of meat. Ribs, chicken wings, bacon, sausage, bologna, salami, chitterlings, fatback, hot dogs, bratwurst, and packaged luncheon meats. Liver and organ meats. Dairy Whole or 2% milk, cream, half-and-half, and cream cheese. Whole milk cheeses. Whole-fat or sweetened yogurt. Full-fat cheeses. Nondairy creamers and whipped toppings. Processed cheese, cheese spreads, or cheese curds. Sweets and Desserts Corn   syrup, sugars, honey, and molasses. Candy. Jam and jelly. Syrup. Sweetened cereals. Cookies, pies, cakes, donuts, muffins, and ice  cream. Fats and Oils Butter, stick margarine, lard, shortening, ghee, or bacon fat. Coconut, palm kernel, or palm oils. Beverages Alcohol. Sweetened drinks (such as sodas, lemonade, and fruit drinks or punches). The items listed above may not be a complete list of foods and beverages to avoid. Contact your dietitian for more information. This information is not intended to replace advice given to you by your health care provider. Make sure you discuss any questions you have with your health care provider. Document Released: 07/20/2011 Document Revised: 09/25/2015 Document Reviewed: 04/19/2013 Elsevier Interactive Patient Education  2018 [employer]. High Cholesterol High cholesterol is a condition in which the blood has high levels of a white, waxy, fat-like substance (cholesterol). The human body needs small amounts of cholesterol. The liver makes all the cholesterol that the body needs. Extra (excess) cholesterol comes from the food that we eat. Cholesterol is carried from the liver by the blood through the blood vessels. If you have high cholesterol, deposits (plaques) may build up on the walls of your blood vessels (arteries). Plaques make the arteries narrower and stiffer. Cholesterol plaques increase your risk for heart attack and stroke. Work with your health care provider to keep your cholesterol levels in a healthy range. What increases the risk? This condition is more likely to develop in people who:  Eat foods that are high in animal fat (saturated fat) or cholesterol.  Are overweight.  Are not getting enough exercise.  Have a family history of high cholesterol.  What are the signs or symptoms? There are no symptoms of this condition. How is this diagnosed? This condition may be diagnosed from the results of a blood test.  If you are older than age 49, your health care provider may check your cholesterol every 4-6 years.  You may be checked more often if you already have  high cholesterol or other risk factors for heart disease.  The blood test for cholesterol measures:  "Bad" cholesterol (LDL cholesterol). This is the main type of cholesterol that causes heart disease. The desired level for LDL is less than 100.  "Good" cholesterol (HDL cholesterol). This type helps to protect against heart disease by cleaning the arteries and carrying the LDL away. The desired level for HDL is 60 or higher.  Triglycerides. These are fats that the body can store or burn for energy. The desired number for triglycerides is lower than 150.  Total cholesterol. This is a measure of the total amount of cholesterol in your blood, including LDL cholesterol, HDL cholesterol, and triglycerides. A healthy number is less than 200.  How is this treated? This condition is treated with diet changes, lifestyle changes, and medicines. Diet changes  This may include eating more whole grains, fruits, vegetables, nuts, and fish.  This may also include cutting back on red meat and foods that have a lot of added sugar. Lifestyle changes  Changes may include getting at least 40 minutes of aerobic exercise 3 times a week. Aerobic exercises include walking, biking, and swimming. Aerobic exercise along with a healthy diet can help you maintain a healthy weight.  Changes may also include quitting smoking. Medicines  Medicines are usually given if diet and lifestyle changes have failed to reduce your cholesterol to healthy levels.  Your health care provider may prescribe a statin medicine. Statin medicines have been shown to reduce cholesterol, which can reduce the  risk of heart disease. Follow these instructions at home: Eating and drinking  If told by your health care provider:  Eat chicken (without skin), fish, veal, shellfish, ground [place] breast, and round or loin cuts of red meat.  Do not eat fried foods or fatty meats, such as hot dogs and salami.  Eat plenty of fruits, such as  apples.  Eat plenty of vegetables, such as broccoli, potatoes, and carrots.  Eat beans, peas, and lentils.  Eat grains such as barley, rice, couscous, and bulgur wheat.  Eat pasta without cream sauces.  Use skim or nonfat milk, and eat low-fat or nonfat yogurt and cheeses.  Do not eat or drink whole milk, cream, ice cream, egg yolks, or hard cheeses.  Do not eat stick margarine or tub margarines that contain trans fats (also called partially hydrogenated oils).  Do not eat saturated tropical oils, such as coconut oil and palm oil.  Do not eat cakes, cookies, crackers, or other baked goods that contain trans fats.  General instructions  Exercise as directed by your health care provider. Increase your activity level with activities such as gardening, walking, and taking the stairs.  Take over-the-counter and prescription medicines only as told by your health care provider.  Do not use any products that contain nicotine or tobacco, such as cigarettes and e-cigarettes. If you need help quitting, ask your health care provider.  Keep all follow-up visits as told by your health care provider. This is important. Contact a health care provider if:  You are struggling to maintain a healthy diet or weight.  You need help to start on an exercise program.  You need help to stop smoking. Get help right away if:  You have chest pain.  You have trouble breathing. This information is not intended to replace advice given to you by your health care provider. Make sure you discuss any questions you have with your health care provider. Document Released: 01/18/2005 Document Revised: 08/16/2015 Document Reviewed: 07/19/2015 Elsevier Interactive Patient Education  [employer].

## 2017-03-03 NOTE — Progress Notes (Signed)
Patient ID: Amber Bird, female    DOB: 1945/05/09, 72 y.o.   MRN: [id number]  Chief Complaint  Patient presents with  . Follow-up    Allergies Patient has no known allergies.  Subjective:   Amber Bird is a 72 y.o. female who presents to [facility] today.  HPI Amber Bird presents today for follow-up.  She reports that she has been seen by pulmonology and has an upcoming bronchoscopy scheduled.  She reports that prior to the bronchoscopy she is having a cardiac evaluation.  She reports that the pulmonologist ordered this due to some nonspecific pains that she will get in her neck from time to time.  She reports that she has been doing well.  Has not been as anxious since she has seen the pulmonologist.  She was very happy with her evaluation in their office.  She reports she is still worried about her cat and plans to take Her chest x-ray soon.  She is concerned that she does have mold in her house.  She is not felt short of breath.  She denies any fever, chills, nausea, vomiting, or diarrhea.  She is here to follow-up on her cholesterol panel.  She reports that she was on cholesterol medication in the past and she believes she might of had a side effect of it making her feel tired.  She reports that she did stop the medication.  She does not have an allergy or harmful side effect to the medication.  She reports she knows she has gotten older since then and at this time she would be willing to try medicine for cholesterol if it would decrease her cardiac risk.  She reports that she wants to live and be healthy.  She reports she still does suffer from anxiety and her mood is not always great WHEN she is always been.  She reports she is not interested in taking any medication at this time for her mood nor does she want to see a therapist.  She is still walking on the treadmill over 3 miles a day.  Tries to eat healthy.  Reports that she does feel some relief after having  gone to the pulmonologist.  Uses her albuterol inhaler sporadically.  Patient reports that she would like a refill on her Zyrtec.  She reports that she has been purchasing the Benadryl over-the-counter and it is expensive.  She reports that she used to be on Zyrtec and it helped with her allergies.  Reports that she does occasionally get ready has a postnasal drip.  Past Medical History:  Diagnosis Date  . Complication of anesthesia    "drop in blood pressure years ago after hysterectomy "   . History of bleeding ulcers   . Hypercholesteremia   . Multiple thyroid nodules   . Seasonal allergies     Past Surgical History:  Procedure Laterality Date  . ABDOMINAL HYSTERECTOMY     complete.   . COLONOSCOPY N/A 12/18/2015   Rourk: diverticulosis in sigmoid colon, otherwise normal   . Cyst removed from ovary  1973  . LAPAROSCOPIC CHOLECYSTECTOMY SINGLE SITE WITH INTRAOPERATIVE CHOLANGIOGRAM N/A 05/12/2016   Procedure: LAPAROSCOPIC CHOLECYSTECTOMY SINGLE SITE WITH INTRAOPERATIVE CHOLANGIOGRAM;  Surgeon: Michael Boston, MD;  Location: [facility];  Service: General;  Laterality: N/A;  . left hammer toe repair    . thyroid needle biopsy      Family History  Problem Relation Age of Onset  . Colon cancer Other   .  Breast cancer Mother   . Tuberculosis Mother   . Heart disease Father      Social History   Socioeconomic History  . Marital status: Divorced    Spouse name: None  . Number of children: None  . Years of education: None  . Highest education level: None  Social Needs  . Financial resource strain: None  . Food insecurity - worry: None  . Food insecurity - inability: None  . Transportation needs - medical: None  . Transportation needs - non-medical: None  Occupational History  . None  Tobacco Use  . Smoking status: Never Smoker  . Smokeless tobacco: Never Used  Substance and Sexual Activity  . Alcohol use: No  . Drug use: No  . Sexual activity: None  Other Topics Concern  .  None  Social History Narrative   Moved from [facility] 2009. In 2008 when the economy dropped lost job. Reports that after lost job was hard to get another job.  Is not married and does not have children. Used to enjoy horses. Reports that moved here b/c it was a lot cheaper and warmer. Had visited here for a horse event in 1990 so decided to move here. Lives in [place], [place]. Reports that made stupid financial decisions and is dealing with that at this time.    Current Outpatient Medications on File Prior to Visit  Medication Sig Dispense Refill  . albuterol (PROVENTIL HFA;VENTOLIN HFA) 108 (90 Base) MCG/ACT inhaler Inhale 2 puffs into the lungs every 6 (six) hours as needed for wheezing or shortness of breath. 1 Inhaler 0  . diphenhydrAMINE (BENADRYL) 25 MG tablet Take 25 mg by mouth every 6 (six) hours as needed.    . naproxen sodium (ALEVE) 220 MG tablet Take 220 mg by mouth.    . famotidine (PEPCID) 20 MG tablet Take 20 mg by mouth 2 (two) times daily.     No current facility-administered medications on file prior to visit.     Review of Systems  HENT: Negative for rhinorrhea and voice change.   Respiratory: Negative for cough, chest tightness and stridor.   Cardiovascular: Negative for chest pain, palpitations and leg swelling.  Gastrointestinal: Negative for abdominal pain, constipation and diarrhea.  Musculoskeletal: Negative for arthralgias, myalgias, neck pain and neck stiffness.  Psychiatric/Behavioral: Negative for behavioral problems, self-injury, sleep disturbance and suicidal ideas.     Objective:   BP 110/68   Pulse 84   Temp 98.6 F (37 C) (Temporal)   Resp 16   Ht 5\' 5"  (1.651 m)   Wt 160 lb 12 oz (72.9 kg)   SpO2 96%   BMI 26.75 kg/m   Physical Exam  Constitutional: She is oriented to person, place, and time. She appears well-developed and well-nourished.  HENT:  Head: Normocephalic and atraumatic.  Eyes: EOM are normal. Pupils are equal, round, and reactive to  light.  Neck: Normal range of motion. Neck supple.  Cardiovascular: Normal rate and regular rhythm.  Pulmonary/Chest: Effort normal and breath sounds normal.  Neurological: She is alert and oriented to person, place, and time.  Skin: Skin is warm and dry.  Vitals reviewed.    Assessment and Plan   1. Pneumonitis Keep scheduled follow-up with pulmonology for bronchoscopy and evaluation.  Keep scheduled follow-up with cardiology.  Patient defers flu shot today.  2. High serum low-density lipoprotein (LDL) Hyperlipidemia and the associated risk of ASCVD were discussed today. Primary vs. Secondary prevention of ASCVD were discussed and how it  relates to patient morbidity, mortality, and quality of life. Shared decision making with patient including the risks of statins vs.benefits of ASCVD risk reduction discussed.  Risks of stains discussed including myopathy, rhabdomyoloysis, liver problems, increased risk of diabetes discussed. We discussed heart healthy diet, lifestyle modifications, risk factor modifications, and adherence to the recommended treatment plan. We discussed the need to periodically monitor lipid panel and liver function tests while on statin therapy.   ASCVD 7.5% risk over the next ten years. Aspirin is not recommended. - atorvastatin (LIPITOR) 20 MG tablet; Take 1 tablet (20 mg total) by mouth daily.  Dispense: 90 tablet; Refill: 3 - Lipid panel  3. Immunization due Tdap vaccination was given in office today. - Flu Vaccine QUAD 6+ mos PF IM (Fluarix Quad PF) - Tdap vaccine greater than or equal to 7yo IM  4. Encounter for hepatitis C screening test for low risk patient Check hepatitis C for screening.  - Hepatitis C antibody  5. High risk medication use  - Hepatic function panel Lab orders placed today.  She will return to clinic in 3 months to recheck her cholesterol and liver function test.  She will return to clinic for labs prior to the office visit so that we will  have them to discuss.  At that time we will plan to check hepatitis C for screening.  6.  Environmental/seasonal allergies. Refill Zyrtec.  Use as directed.  Allergy avoidance and precautions.  Wait for mole/respiratory panel from pulmonary. Return in about 3 months (around 05/31/2017) for follow up. [name], MD 03/03/2017

## 2017-03-03 NOTE — Telephone Encounter (Signed)
Cb# [phone]   Patient is requesting a call to go over her bone density results

## 2017-03-06 LAB — HYPERSENSITIVITY PNUEMONITIS PROFILE
ASPERGILLUS FUMIGATUS: NEGATIVE
Faenia retivirgula: NEGATIVE
Pigeon Serum: NEGATIVE
S. VIRIDIS: NEGATIVE
T. CANDIDUS: NEGATIVE
T. VULGARIS: NEGATIVE

## 2017-03-07 ENCOUNTER — Other Ambulatory Visit: Payer: Self-pay

## 2017-03-07 ENCOUNTER — Ambulatory Visit (HOSPITAL_COMMUNITY): Payer: Medicare HMO | Attending: Cardiovascular Disease

## 2017-03-07 DIAGNOSIS — R0600 Dyspnea, unspecified: Secondary | ICD-10-CM | POA: Insufficient documentation

## 2017-03-07 DIAGNOSIS — E7800 Pure hypercholesterolemia, unspecified: Secondary | ICD-10-CM | POA: Diagnosis not present

## 2017-03-07 DIAGNOSIS — I340 Nonrheumatic mitral (valve) insufficiency: Secondary | ICD-10-CM | POA: Diagnosis not present

## 2017-03-07 NOTE — Telephone Encounter (Signed)
What can I advise patient?

## 2017-03-08 NOTE — Telephone Encounter (Signed)
We can go over this when she is in the office the next time. Gwen Her. Mannie Stabile, MD

## 2017-03-10 ENCOUNTER — Telehealth: Payer: Self-pay | Admitting: Pulmonary Disease

## 2017-03-10 NOTE — Telephone Encounter (Signed)
Called patient regarding message below. No answer, unable to leave message.  

## 2017-03-10 NOTE — Telephone Encounter (Signed)
Called and spoke to pt. Pt states she does not have anyone to take pt home after procedure. Advised pt that she could call cab to take her to and from procedure. Pt states she will find out about cab information and call back. Will await pt's call back.

## 2017-03-11 NOTE — Telephone Encounter (Signed)
Letter sent.

## 2017-03-11 NOTE — Telephone Encounter (Signed)
Will call on Monday to schedule bronch. They close at 4:30

## 2017-03-11 NOTE — Telephone Encounter (Signed)
Please advise BQ

## 2017-03-11 NOTE — Telephone Encounter (Signed)
Friday at [facility], any time, fluoro yes, no TB risk

## 2017-03-11 NOTE — Telephone Encounter (Signed)
Pt is returning call about the bronch. She states any day next week would be ok with for her. Cb is [phone].

## 2017-03-11 NOTE — Telephone Encounter (Signed)
Called patient regarding message below. No answer, unable to leave message.  

## 2017-03-14 NOTE — Telephone Encounter (Signed)
Per [facility]ch [facility] we can only scheduled bronchs on Friday at [facility].  States we can only do bronchs at [facility] on Monday and Wednesday.    BQ ok to schedule at [facility]?

## 2017-03-16 NOTE — Telephone Encounter (Signed)
Just to clarify BQ, anytime on Monday at [facility]?

## 2017-03-16 NOTE — Telephone Encounter (Signed)
sure

## 2017-03-16 NOTE — Telephone Encounter (Signed)
Will call on 03/17/17 to schedule, as they are currently closed.

## 2017-03-16 NOTE — Telephone Encounter (Signed)
I don't think this is a rule I can do it at [facility] on Friday If that doesn't work then I can do it at [facility] next Monday

## 2017-03-17 NOTE — Telephone Encounter (Signed)
11 AM Monday is fine

## 2017-03-17 NOTE — Telephone Encounter (Addendum)
Ok advised Amber Bird Monday at 11:00 am  at [facility] is fine. Advised pt as well. Nothing further is needed. FYI BQ

## 2017-03-17 NOTE — Telephone Encounter (Signed)
LMTCB X1 for Baxter Flattery with [facility] resp.

## 2017-03-17 NOTE — Telephone Encounter (Signed)
Spoke with Amber Bird, she states unable to do Monday morning at [facility]. On Tues she already has two people there,  MW has a bronch at 8:15am so it would have to be around 11:00am. .She states Wednesday is wide open in the morning. Please advise.

## 2017-03-21 ENCOUNTER — Ambulatory Visit (HOSPITAL_COMMUNITY)
Admission: RE | Admit: 2017-03-21 | Discharge: 2017-03-21 | Disposition: A | Discharge status: Home / Self Care | Payer: Medicare HMO | Source: Ambulatory Visit | Attending: Pulmonary Disease | Admitting: Pulmonary Disease

## 2017-03-21 ENCOUNTER — Ambulatory Visit (HOSPITAL_COMMUNITY): Payer: Medicare HMO

## 2017-03-21 ENCOUNTER — Encounter (HOSPITAL_COMMUNITY)
Admission: RE | Disposition: A | Discharge status: Home / Self Care | Payer: Self-pay | Source: Ambulatory Visit | Attending: Pulmonary Disease

## 2017-03-21 HISTORY — PX: VIDEO BRONCHOSCOPY: SHX5072

## 2017-03-21 SURGERY — BRONCHOSCOPY, WITH FLUOROSCOPY
Anesthesia: Moderate Sedation | Laterality: Bilateral

## 2017-03-21 MED ORDER — SODIUM CHLORIDE 0.9 % IV SOLN: INTRAVENOUS | Status: DC

## 2017-03-21 NOTE — H&P (Signed)
Didn't have a ride  Bronchoscopy canceled

## 2017-03-21 NOTE — Discharge Instructions (Signed)
Flexible Bronchoscopy, Care After These instructions give you information on caring for yourself after your procedure. Your doctor may also give you more specific instructions. Call your doctor if you have any problems or questions after your procedure. Follow these instructions at home:  Do not eat or drink anything for 2 hours after your procedure. If you try to eat or drink before the medicine wears off, food or drink could go into your lungs. You could also burn yourself.  After 2 hours have passed and when you can cough and gag normally, you may eat soft food and drink liquids slowly.  The day after the test, you may eat your normal diet.  You may do your normal activities.  Keep all doctor visits. Get help right away if:  You get more and more short of breath.  You get light-headed.  You feel like you are going to pass out (faint).  You have chest pain.  You have new problems that worry you.  You cough up more than a little blood.  You cough up more blood than before. This information is not intended to replace advice given to you by your health care provider. Make sure you discuss any questions you have with your health care provider. Document Released: 11/15/2008 Document Revised: 06/26/2015 Document Reviewed: 09/22/2012 Elsevier Interactive Patient Education  2017 [employer].

## 2017-03-22 ENCOUNTER — Ambulatory Visit (HOSPITAL_COMMUNITY): Payer: Medicare HMO

## 2017-03-22 ENCOUNTER — Encounter (HOSPITAL_COMMUNITY): Payer: Self-pay

## 2017-03-24 ENCOUNTER — Encounter (HOSPITAL_COMMUNITY): Payer: Self-pay | Admitting: Pulmonary Disease

## 2017-03-29 ENCOUNTER — Telehealth: Payer: Self-pay | Admitting: Family Medicine

## 2017-03-29 DIAGNOSIS — J4520 Mild intermittent asthma, uncomplicated: Secondary | ICD-10-CM

## 2017-03-29 MED ORDER — ALBUTEROL SULFATE HFA 108 (90 BASE) MCG/ACT IN AERS: 2.0000 | INHALATION_SPRAY | Freq: Four times a day (QID) | RESPIRATORY_TRACT | 0 refills | Status: DC | PRN

## 2017-03-29 NOTE — Telephone Encounter (Signed)
Patient is requesting refill on inhaler. Uses [facility] pharmacy Cb#: [phone]

## 2017-03-29 NOTE — Telephone Encounter (Signed)
Seen 1 31 [phone] 

## 2017-06-13 DIAGNOSIS — Z1159 Encounter for screening for other viral diseases: Secondary | ICD-10-CM | POA: Diagnosis not present

## 2017-06-13 DIAGNOSIS — Z79899 Other long term (current) drug therapy: Secondary | ICD-10-CM | POA: Diagnosis not present

## 2017-06-13 DIAGNOSIS — R7989 Other specified abnormal findings of blood chemistry: Secondary | ICD-10-CM | POA: Diagnosis not present

## 2017-06-14 ENCOUNTER — Encounter: Payer: Self-pay | Admitting: Family Medicine

## 2017-06-14 LAB — HEPATIC FUNCTION PANEL
AG Ratio: 1.6 (calc) (ref 1.0–2.5)
ALT: 13 U/L (ref 6–29)
AST: 19 U/L (ref 10–35)
Albumin: 4.1 g/dL (ref 3.6–5.1)
Alkaline phosphatase (APISO): 90 U/L (ref 33–130)
Bilirubin, Direct: 0.1 mg/dL (ref 0.0–0.2)
Globulin: 2.5 g/dL (calc) (ref 1.9–3.7)
Indirect Bilirubin: 0.4 mg/dL (calc) (ref 0.2–1.2)
Total Bilirubin: 0.5 mg/dL (ref 0.2–1.2)
Total Protein: 6.6 g/dL (ref 6.1–8.1)

## 2017-06-14 LAB — LIPID PANEL
Cholesterol: 154 mg/dL (ref <200)
HDL: 54 mg/dL (ref >50)
LDL Cholesterol (Calc): 76 mg/dL (calc)
Non-HDL Cholesterol (Calc): 100 mg/dL (calc) (ref <130)
Total CHOL/HDL Ratio: 2.9 (calc) (ref <5.0)
Triglycerides: 139 mg/dL (ref <150)

## 2017-06-14 LAB — HEPATITIS C ANTIBODY
Hepatitis C Ab: NONREACTIVE
SIGNAL TO CUT-OFF: 0.01 (ref <1.00)

## 2017-06-24 ENCOUNTER — Telehealth: Payer: Self-pay | Admitting: Clinical

## 2017-06-24 ENCOUNTER — Encounter: Payer: Self-pay | Admitting: Family Medicine

## 2017-06-24 ENCOUNTER — Ambulatory Visit (INDEPENDENT_AMBULATORY_CARE_PROVIDER_SITE_OTHER): Payer: Medicare HMO | Admitting: Family Medicine

## 2017-06-24 ENCOUNTER — Telehealth: Payer: Self-pay | Admitting: Family Medicine

## 2017-06-24 VITALS — BP 120/80 | HR 101 | Resp 16 | Ht 65.0 in | Wt 157.8 lb

## 2017-06-24 DIAGNOSIS — F339 Major depressive disorder, recurrent, unspecified: Secondary | ICD-10-CM | POA: Diagnosis not present

## 2017-06-24 DIAGNOSIS — F39 Unspecified mood [affective] disorder: Secondary | ICD-10-CM | POA: Diagnosis not present

## 2017-06-24 DIAGNOSIS — R69 Illness, unspecified: Secondary | ICD-10-CM | POA: Diagnosis not present

## 2017-06-24 DIAGNOSIS — E041 Nontoxic single thyroid nodule: Secondary | ICD-10-CM | POA: Diagnosis not present

## 2017-06-24 DIAGNOSIS — R310 Gross hematuria: Secondary | ICD-10-CM | POA: Diagnosis not present

## 2017-06-24 DIAGNOSIS — R102 Pelvic and perineal pain: Secondary | ICD-10-CM | POA: Diagnosis not present

## 2017-06-24 DIAGNOSIS — F4320 Adjustment disorder, unspecified: Secondary | ICD-10-CM

## 2017-06-24 MED ORDER — CITALOPRAM HYDROBROMIDE 20 MG PO TABS: 20.0000 mg | ORAL_TABLET | Freq: Every day | ORAL | 3 refills | Status: DC
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
## 2017-06-24 NOTE — Telephone Encounter (Signed)
Please call patient to discuss lab results.  I told her you would not call till Tuesday or Wednesday.
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
## 2017-06-25 LAB — URINALYSIS, ROUTINE W REFLEX MICROSCOPIC
Bacteria, UA: NONE SEEN /HPF
Bilirubin Urine: NEGATIVE
Glucose, UA: NEGATIVE
Hgb urine dipstick: NEGATIVE
Hyaline Cast: NONE SEEN /LPF
Ketones, ur: NEGATIVE
Nitrite: NEGATIVE
Protein, ur: NEGATIVE
RBC / HPF: NONE SEEN /HPF (ref 0–2)
Specific Gravity, Urine: 1.006 (ref 1.001–1.03)
Squamous Epithelial / LPF: NONE SEEN /HPF (ref <=5)
pH: 7 (ref 5.0–8.0)

## 2017-06-25 LAB — URINE CULTURE
MICRO NUMBER:: 90635960
SPECIMEN QUALITY:: ADEQUATE

## 2017-06-25 LAB — THYROID PANEL WITH TSH
Free Thyroxine Index: 1.8 (ref 1.4–3.8)
T3 Uptake: 27 % (ref 22–35)
T4, Total: 6.8 ug/dL (ref 5.1–11.9)
TSH: 1.33 mIU/L (ref 0.40–4.50)

## 2017-06-27 ENCOUNTER — Encounter: Payer: Self-pay | Admitting: Family Medicine

## 2017-06-28 ENCOUNTER — Encounter (HOSPITAL_COMMUNITY): Payer: Self-pay | Admitting: Psychiatry

## 2017-06-28 NOTE — Telephone Encounter (Signed)
You may advised that lab letter was sent in the mail.  Her labs were within normal limits.  We can discuss at follow-up.

## 2017-06-29 NOTE — Telephone Encounter (Signed)
Spoke with patient and advised of Dr.Haglers message with verbal understanding 
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
## 2017-06-29 NOTE — Telephone Encounter (Signed)
This encounter was created in error - please disregard.

## 2017-07-05 ENCOUNTER — Telehealth: Payer: Self-pay

## 2017-07-05 DIAGNOSIS — F339 Major depressive disorder, recurrent, unspecified: Secondary | ICD-10-CM

## 2017-07-05 NOTE — BH Specialist Note (Signed)
[facility] Telephone Follow-up  MRN: [id number] NAME: Amber Bird Date: 07/05/17    Total time: 15 minutes Call number: 2/6  Reason for call today: Reason for Contact: PHQ9-2 weeks  PHQ-9 Scores:  Depression screen [facility] 2/9 07/05/2017 02/03/2017  Decreased Interest 3 0  Down, Depressed, Hopeless 2 0  PHQ - 2 Score 5 0  Altered sleeping 2 -  Tired, decreased energy 3 -  Change in appetite 1 -  Feeling bad or failure about yourself  1 -  Trouble concentrating 1 -  Moving slowly or fidgety/restless 0 -  Suicidal thoughts 0 -  PHQ-9 Score 13 -  Difficult doing work/chores Somewhat difficult -      GAD-7 Scores:  GAD 7 : Generalized Anxiety Score 07/05/2017  Nervous, Anxious, on Edge 1  Control/stop worrying 1  Worry too much - different things 2  Trouble relaxing 1  Restless 0  Easily annoyed or irritable 0  Afraid - awful might happen 1  Total GAD 7 Score 6  Anxiety Difficulty Somewhat difficult      Stress Current stressors: Current Stressors: Finances, Grief/losses, Job loss/unemployment; Living alone and no family in the area Sleep: Sleep: No problems Appetite: Appetite: No problems Coping ability: Coping ability: Normal Patient taking medications as prescribed: Patient taking medications as prescribed: Yes  Current medications:  Outpatient Encounter Medications as of 07/05/2017  Medication Sig  . albuterol (PROVENTIL HFA;VENTOLIN HFA) 108 (90 Base) MCG/ACT inhaler Inhale 2 puffs into the lungs every 6 (six) hours as needed for wheezing or shortness of breath.  Marland Kitchen atorvastatin (LIPITOR) 20 MG tablet Take 1 tablet (20 mg total) by mouth daily.  . cetirizine (ZYRTEC) 10 MG tablet Take 1 tablet (10 mg total) by mouth daily. (Patient taking differently: Take 10 mg by mouth daily as needed for allergies. )  . citalopram (CELEXA) 20 MG tablet Take 1 tablet (20 mg total) by mouth daily.  Marland Kitchen triamcinolone (KENALOG) 0.025 % cream Apply 1 application topically  as needed (dry skin).   No facility-administered encounter medications on file as of 07/05/2017.      Self-harm Behaviors Risk Assessment Self-harm risk factors: Self-harm risk factors: (N/A) Patient endorses recent thoughts of harming self: Have you recently had any thoughts about harming yourself?: No    Danger to Others Risk Assessment Danger to others risk factors: Danger to Others Risk Factors: No risk factors noted Patient endorses recent thoughts of harming others: Notification required: No need or identified person   Substance Use Assessment Patient recently consumed alcohol:  No  Patient recently used drugs:  No  Opioid Risk Assessment: No   Goals, Interventions and Follow-up Plan Goals: Increase healthy adjustment to current life circumstances Interventions: Motivational Interviewing, Solution-Focused Strategies, Mindfulness or Psychologist, educational, Behavioral Activation, Brief CBT and Medication Monitoring  Follow-up Plan: 1.  Grieving over the loss of her dogs 2. Discussed unresolved issues of past choices in her life.    Summary:  Patent reports that her medication has not working for her.  Patient reports that she has limited finances therefore, she is only able to leave the home once weekly.    Patient denies any problems wiith her sleep or appetite.        Graciella Freer [place], LCAS-A

## 2017-07-07 ENCOUNTER — Encounter: Payer: Self-pay | Admitting: Family Medicine

## 2017-07-08 ENCOUNTER — Telehealth: Payer: Self-pay | Admitting: Family Medicine

## 2017-07-08 ENCOUNTER — Encounter: Payer: Self-pay | Admitting: Family Medicine

## 2017-07-08 NOTE — Telephone Encounter (Signed)
Patient states she had the pharmacist print off the side effects of celexa. She has been taking this medication for 13 days & feels no better. Patient states she is tired and having a lot of headaches. She wants to stop taking this medication Please advise.

## 2017-07-12 NOTE — Telephone Encounter (Signed)
I would recommend that she continue the medication and keep her scheduled follow up. If she chooses not to continue then she can just stop the medication and follow up. Gwen Her. Mannie Stabile, MD

## 2017-07-13 NOTE — Telephone Encounter (Signed)
Spoke with patient regarding her message and Dr.Hagler's recommendations. Patient states she is concerned because she has a history of bleeding ulcers and she takes and allergy medicine to help with sneezing and the information she had read states it isn't recommended to take Celexa if you have bleeding ulcers or you take that medication. She stated she is just going to stop it. She has only been taking 1/2 of the dose daily. I ask her to keep her follow up appointment and she stated she would.

## 2017-07-14 ENCOUNTER — Ambulatory Visit (INDEPENDENT_AMBULATORY_CARE_PROVIDER_SITE_OTHER): Payer: Medicare HMO | Admitting: Obstetrics & Gynecology

## 2017-07-14 ENCOUNTER — Encounter: Payer: Self-pay | Admitting: Obstetrics & Gynecology

## 2017-07-14 VITALS — BP 107/57 | HR 48 | Ht 65.0 in | Wt 161.0 lb

## 2017-07-14 DIAGNOSIS — R102 Pelvic and perineal pain: Secondary | ICD-10-CM

## 2017-07-14 DIAGNOSIS — K5904 Chronic idiopathic constipation: Secondary | ICD-10-CM

## 2017-07-14 MED ORDER — POLYETHYLENE GLYCOL 3350 17 GM/SCOOP PO POWD: ORAL | 11 refills | Status: DC

## 2017-07-14 NOTE — Progress Notes (Signed)
Chief Complaint  Patient presents with  . Pelvic Pain      72 y.o. G0P0000 No LMP recorded. Patient has had a hysterectomy. The current method of family planning is status post hysterectomy.  Outpatient Encounter Medications as of 07/14/2017  Medication Sig  . atorvastatin (LIPITOR) 20 MG tablet Take 1 tablet (20 mg total) by mouth daily.  . cetirizine (ZYRTEC) 10 MG tablet Take 1 tablet (10 mg total) by mouth daily. (Patient taking differently: Take 10 mg by mouth daily as needed for allergies. )  . citalopram (CELEXA) 20 MG tablet Take 1 tablet (20 mg total) by mouth daily.  Marland Kitchen albuterol (PROVENTIL HFA;VENTOLIN HFA) 108 (90 Base) MCG/ACT inhaler Inhale 2 puffs into the lungs every 6 (six) hours as needed for wheezing or shortness of breath. (Patient not taking: Reported on 07/14/2017)  . polyethylene glycol powder (GLYCOLAX/MIRALAX) powder 1 scoop daily or as needed  . triamcinolone (KENALOG) 0.025 % cream Apply 1 application topically as needed (dry skin).   No facility-administered encounter medications on file as of 07/14/2017.     Subjective Amber Bird  Presents complaining of chronic pelvic discomfort over the past few years but worse the last few months Discomfort is sharp episodic and last a few minutes She is s/p TAH BSO and is confirmed by 01/2016 CT scan, which is reviewed in full and revealed chronic constipation Some pinkish vaginal dischrge at times No bleeding No urinary symptoms Discomfort is moderate Past Medical History:  Diagnosis Date  . Complication of anesthesia    "drop in blood pressure years ago after hysterectomy "   . History of bleeding ulcers   . Hypercholesteremia   . Multiple thyroid nodules   . Seasonal allergies     Past Surgical History:  Procedure Laterality Date  . ABDOMINAL HYSTERECTOMY     complete.   . COLONOSCOPY N/A 12/18/2015   Rourk: diverticulosis in sigmoid colon, otherwise normal   . Cyst removed from ovary   1973  . LAPAROSCOPIC CHOLECYSTECTOMY SINGLE SITE WITH INTRAOPERATIVE CHOLANGIOGRAM N/A 05/12/2016   Procedure: LAPAROSCOPIC CHOLECYSTECTOMY SINGLE SITE WITH INTRAOPERATIVE CHOLANGIOGRAM;  Surgeon: Michael Boston, MD;  Location: [facility];  Service: General;  Laterality: N/A;  . left hammer toe repair    . thyroid needle biopsy    . VIDEO BRONCHOSCOPY Bilateral 03/21/2017   Procedure: VIDEO BRONCHOSCOPY WITH FLUORO;  Surgeon: Juanito Doom, MD;  Location: [facility];  Service: Cardiopulmonary;  Laterality: Bilateral;    OB History    Gravida  0   Para  0   Term  0   Preterm  0   AB  0   Living  0     SAB  0   TAB  0   Ectopic  0   Multiple  0   Live Births  0           No Known Allergies  Social History   Socioeconomic History  . Marital status: Divorced    Spouse name: Not on file  . Number of children: Not on file  . Years of education: Not on file  . Highest education level: Not on file  Occupational History  . Not on file  Social Needs  . Financial resource strain: Not on file  . Food insecurity:    Worry: Not on file    Inability: Not on file  . Transportation needs:    Medical: Not on file    Non-medical: Not  on file  Tobacco Use  . Smoking status: Never Smoker  . Smokeless tobacco: Never Used  Substance and Sexual Activity  . Alcohol use: No  . Drug use: No  . Sexual activity: Not Currently    Birth control/protection: None  Lifestyle  . Physical activity:    Days per week: Not on file    Minutes per session: Not on file  . Stress: Not on file  Relationships  . Social connections:    Talks on phone: Not on file    Gets together: Not on file    Attends religious service: Not on file    Active member of club or organization: Not on file    Attends meetings of clubs or organizations: Not on file    Relationship status: Not on file  Other Topics Concern  . Not on file  Social History Narrative   Moved from [facility] 2009. In 2008 when the  economy dropped lost job. Reports that after lost job was hard to get another job.  Is not married and does not have children. Used to enjoy horses. Reports that moved here b/c it was a lot cheaper and warmer. Had visited here for a horse event in 1990 so decided to move here. Lives in [place], [place]. Reports that made stupid financial decisions and is dealing with that at this time.     Family History  Problem Relation Age of Onset  . Colon cancer Other   . Breast cancer Mother   . Tuberculosis Mother   . Heart disease Father     Medications:       Current Outpatient Medications:  .  atorvastatin (LIPITOR) 20 MG tablet, Take 1 tablet (20 mg total) by mouth daily., Disp: 90 tablet, Rfl: 3 .  cetirizine (ZYRTEC) 10 MG tablet, Take 1 tablet (10 mg total) by mouth daily. (Patient taking differently: Take 10 mg by mouth daily as needed for allergies. ), Disp: 30 tablet, Rfl: 11 .  citalopram (CELEXA) 20 MG tablet, Take 1 tablet (20 mg total) by mouth daily., Disp: 30 tablet, Rfl: 3 .  albuterol (PROVENTIL HFA;VENTOLIN HFA) 108 (90 Base) MCG/ACT inhaler, Inhale 2 puffs into the lungs every 6 (six) hours as needed for wheezing or shortness of breath. (Patient not taking: Reported on 07/14/2017), Disp: 1 Inhaler, Rfl: 0 .  polyethylene glycol powder (GLYCOLAX/MIRALAX) powder, 1 scoop daily or as needed, Disp: 255 g, Rfl: 11 .  triamcinolone (KENALOG) 0.025 % cream, Apply 1 application topically as needed (dry skin)., Disp: , Rfl:   Objective Blood pressure (!) 107/57, pulse (!) 48, height 5\' 5"  (1.651 m), weight 161 lb (73 kg).  General WDWN female NAD Vulva:  atrophic Vagina:  atrophic, no discharge, no  lesions, no blood seen Cervix:  absent Uterus:  uterus absent Adnexa: ovaries:present,     Pertinent ROS No burning with urination, frequency or urgency No nausea, vomiting or diarrhea Nor fever chills or other constitutional symptoms   Labs or studies Reviewed past scans and  labs    Impression Diagnoses this Encounter::   ICD-10-CM   1. Chronic idiopathic constipation K59.04   2. Pelvic pain R10.2     Established relevant diagnosis(es):   Plan/Recommendations: Meds ordered this encounter  Medications  . polyethylene glycol powder (GLYCOLAX/MIRALAX) powder    Sig: 1 scoop daily or as needed    Dispense:  255 g    Refill:  11    Labs or Scans Ordered: No orders of the  defined types were placed in this encounter.   Management:: miralax powder daily, decide how much to take, play with dosing No gyn or bladder source of pain is found  Follow up Return if symptoms worsen or fail to improve.       All questions were answered.

## 2017-07-18 ENCOUNTER — Ambulatory Visit (HOSPITAL_COMMUNITY)
Admission: RE | Admit: 2017-07-18 | Discharge: 2017-07-18 | Disposition: A | Discharge status: Home / Self Care | Payer: Medicare HMO | Source: Ambulatory Visit | Attending: Family Medicine | Admitting: Family Medicine

## 2017-07-18 ENCOUNTER — Ambulatory Visit (HOSPITAL_COMMUNITY)
Admission: RE | Admit: 2017-07-18 | Discharge: 2017-07-18 | Disposition: A | Discharge status: Home / Self Care | Payer: Medicare HMO | Source: Ambulatory Visit | Attending: Urology | Admitting: Urology

## 2017-07-18 DIAGNOSIS — E042 Nontoxic multinodular goiter: Secondary | ICD-10-CM | POA: Diagnosis not present

## 2017-07-18 DIAGNOSIS — E041 Nontoxic single thyroid nodule: Secondary | ICD-10-CM

## 2017-07-18 DIAGNOSIS — Q6211 Congenital occlusion of ureteropelvic junction: Secondary | ICD-10-CM | POA: Insufficient documentation

## 2017-07-18 DIAGNOSIS — Q6239 Other obstructive defects of renal pelvis and ureter: Secondary | ICD-10-CM

## 2017-07-20 ENCOUNTER — Encounter: Payer: Self-pay | Admitting: Family Medicine

## 2017-07-21 ENCOUNTER — Telehealth: Payer: Self-pay | Admitting: Family Medicine

## 2017-07-21 NOTE — Telephone Encounter (Signed)
Patient is calling to inquire about her thyroid US results. Cb#: [phone]  She is aware that Dr.Hagler is out of the office.

## 2017-07-22 ENCOUNTER — Telehealth: Payer: Self-pay | Admitting: Clinical

## 2017-07-22 NOTE — Telephone Encounter (Signed)
Behavioral health intern called patient. Patient picked up, but indicated that now was not a good time to talk. Behavioral health intern informed patient that some one will be in touch with her at a later time.   [place] Intern

## 2017-07-25 NOTE — Telephone Encounter (Signed)
Spoke with patient and advised of Dr.Haglers message with verbal understanding

## 2017-07-25 NOTE — Telephone Encounter (Signed)
She has been given the results and we will discuss in more detail at her follow up visit.Gwen Her. Mannie Stabile, MD

## 2017-07-26 DIAGNOSIS — Q6211 Congenital occlusion of ureteropelvic junction: Secondary | ICD-10-CM | POA: Diagnosis not present

## 2017-08-02 ENCOUNTER — Ambulatory Visit: Payer: Medicare HMO | Admitting: Family Medicine

## 2017-08-16 ENCOUNTER — Encounter: Payer: Self-pay | Admitting: Family Medicine

## 2017-08-16 ENCOUNTER — Ambulatory Visit: Payer: Medicare HMO | Admitting: Family Medicine

## 2017-08-16 ENCOUNTER — Encounter (INDEPENDENT_AMBULATORY_CARE_PROVIDER_SITE_OTHER): Payer: Self-pay

## 2017-08-16 ENCOUNTER — Other Ambulatory Visit: Payer: Self-pay

## 2017-08-16 ENCOUNTER — Ambulatory Visit (INDEPENDENT_AMBULATORY_CARE_PROVIDER_SITE_OTHER): Payer: Medicare HMO | Admitting: Family Medicine

## 2017-08-16 ENCOUNTER — Telehealth: Payer: Self-pay | Admitting: Family Medicine

## 2017-08-16 VITALS — BP 116/74 | HR 86 | Temp 98.7°F | Resp 18 | Ht 65.0 in | Wt 160.0 lb

## 2017-08-16 DIAGNOSIS — F339 Major depressive disorder, recurrent, unspecified: Secondary | ICD-10-CM

## 2017-08-16 DIAGNOSIS — Z23 Encounter for immunization: Secondary | ICD-10-CM

## 2017-08-16 DIAGNOSIS — E042 Nontoxic multinodular goiter: Secondary | ICD-10-CM | POA: Diagnosis not present

## 2017-08-16 DIAGNOSIS — J4520 Mild intermittent asthma, uncomplicated: Secondary | ICD-10-CM

## 2017-08-16 DIAGNOSIS — R69 Illness, unspecified: Secondary | ICD-10-CM | POA: Diagnosis not present

## 2017-08-16 NOTE — Telephone Encounter (Signed)
Patient is requesting more refills on her citalopram and her atorvastatin for the time that you are transitioning to your new office. She plans to follow you to your new office.

## 2017-08-16 NOTE — Patient Instructions (Signed)
Exercise.  Increase the celexa to 20mg  a day as prescribed.

## 2017-08-16 NOTE — Progress Notes (Signed)
Patient ID: Amber Bird, female    DOB: 1945-09-19, 72 y.o.   MRN: [id number]  Chief Complaint  Patient presents with  . thyroid ultrasound    follow up  . gross hematuria    follow up    Allergies Patient has no known allergies.  Subjective:   Amber Bird is a 72 y.o. female who presents to [facility] today.  HPI Here for follow up visit. She reports that decided that she needed to clean out her carport and made a lot of trips to the dump and [place]. She reports that she was in a lot of debt and so she decided to get a home equity loan. Reports that was told that she needed to paint some of her house and make some improvements in order to get a better loan. Reports that she was busy during this time frame and was busy and now that she has completed all the activities at her house she has been feeling a bit down. Reports that enjoyed being active. Is considering getting a job at [employer]. Reports that is going to get a job and it would help to have some discretionary income. Reports that has lots of bills from her vet bills.  She reports that she went to the Gynecologist/Dr. Elonda Husky and enjoyed the visit. She reports that she was told that has some atropy in her vaginal/genital area.  Has seen Dr. Alyson Ingles for hematuria.  Has also had the thyroid ultrasound. Reports that is doing well. Energy is better.  Breathing is good per patient report. No SOB. Reports that has not had any problems since the carpet was removed. Has not had to use the inhaler.  Patient requests a prescription for ambien. Reports that falls asleep while is watching TV and then falls asleep. Once mind gets started thinking about the stressors of life and all of her bills that she cannot sleep. Has quit exercising on the treadmill. Has been going to church some with some people that she has met.    Past Medical History:  Diagnosis Date  . Complication of anesthesia    "drop in blood pressure  years ago after hysterectomy "   . History of bleeding ulcers   . Hypercholesteremia   . Multiple thyroid nodules   . Seasonal allergies     Past Surgical History:  Procedure Laterality Date  . ABDOMINAL HYSTERECTOMY     complete.   . COLONOSCOPY N/A 12/18/2015   Rourk: diverticulosis in sigmoid colon, otherwise normal   . Cyst removed from ovary  1973  . LAPAROSCOPIC CHOLECYSTECTOMY SINGLE SITE WITH INTRAOPERATIVE CHOLANGIOGRAM N/A 05/12/2016   Procedure: LAPAROSCOPIC CHOLECYSTECTOMY SINGLE SITE WITH INTRAOPERATIVE CHOLANGIOGRAM;  Surgeon: Michael Boston, MD;  Location: [facility];  Service: General;  Laterality: N/A;  . left hammer toe repair    . thyroid needle biopsy    . VIDEO BRONCHOSCOPY Bilateral 03/21/2017   Procedure: VIDEO BRONCHOSCOPY WITH FLUORO;  Surgeon: Juanito Doom, MD;  Location: [facility];  Service: Cardiopulmonary;  Laterality: Bilateral;    Family History  Problem Relation Age of Onset  . Colon cancer Other   . Breast cancer Mother   . Tuberculosis Mother   . Heart disease Father      Social History   Socioeconomic History  . Marital status: Divorced    Spouse name: Not on file  . Number of children: Not on file  . Years of education: Not on file  . Highest  education level: Not on file  Occupational History  . Not on file  Social Needs  . Financial resource strain: Not on file  . Food insecurity:    Worry: Not on file    Inability: Not on file  . Transportation needs:    Medical: Not on file    Non-medical: Not on file  Tobacco Use  . Smoking status: Never Smoker  . Smokeless tobacco: Never Used  Substance and Sexual Activity  . Alcohol use: No  . Drug use: No  . Sexual activity: Not Currently    Birth control/protection: None  Lifestyle  . Physical activity:    Days per week: Not on file    Minutes per session: Not on file  . Stress: Not on file  Relationships  . Social connections:    Talks on phone: Not on file    Gets together:  Not on file    Attends religious service: Not on file    Active member of club or organization: Not on file    Attends meetings of clubs or organizations: Not on file    Relationship status: Not on file  Other Topics Concern  . Not on file  Social History Narrative   Moved from [place] 2009. In 2008 when the economy dropped lost job. Reports that after lost job was hard to get another job.  Is not married and does not have children. Used to enjoy horses. Reports that moved here b/c it was a lot cheaper and warmer. Had visited here for a horse event in 1990 so decided to move here. Lives in [place], [place]. Reports that made stupid financial decisions and is dealing with that at this time.    Current Outpatient Medications on File Prior to Visit  Medication Sig Dispense Refill  . cetirizine (ZYRTEC) 10 MG tablet Take 1 tablet (10 mg total) by mouth daily. (Patient taking differently: Take 10 mg by mouth daily as needed for allergies. ) 30 tablet 11  . citalopram (CELEXA) 20 MG tablet Take 1 tablet (20 mg total) by mouth daily. 30 tablet 3  . polyethylene glycol powder (GLYCOLAX/MIRALAX) powder 1 scoop daily or as needed 255 g 11  . triamcinolone (KENALOG) 0.025 % cream Apply 1 application topically as needed (dry skin).     No current facility-administered medications on file prior to visit.     Review of Systems  Constitutional: Negative for appetite change, chills, fatigue, fever and unexpected weight change.  HENT: Negative for trouble swallowing and voice change.   Respiratory: Negative for cough, choking and shortness of breath.   Cardiovascular: Negative for chest pain, palpitations and leg swelling.  Gastrointestinal: Negative for abdominal pain, diarrhea and nausea.  Genitourinary: Negative for decreased urine volume, dysuria, frequency, hematuria, pelvic pain and urgency.  Musculoskeletal: Negative for back pain and myalgias.  Skin: Negative for rash.  Neurological: Negative for  weakness and headaches.  Psychiatric/Behavioral: Negative for behavioral problems, confusion, decreased concentration, dysphoric mood, hallucinations and sleep disturbance. The patient is not nervous/anxious and is not hyperactive.      Objective:   BP 116/74 (BP Location: Left Arm, Patient Position: Sitting, Cuff Size: Normal)   Pulse 86   Temp 98.7 F (37.1 C) (Temporal)   Resp 18   Ht 5' 5" (1.651 m)   Wt 160 lb (72.6 kg)   SpO2 96%   BMI 26.63 kg/m   Physical Exam  Constitutional: She appears well-developed and well-nourished.  Cardiovascular: Normal rate, regular rhythm and   normal heart sounds.  Psychiatric: She has a normal mood and affect. Her speech is normal. Judgment normal. Cognition and memory are normal. She expresses no homicidal and no suicidal ideation. She expresses no suicidal plans and no homicidal plans.  Very talkative and somewhat tangential. Hard to get a word into the conversation. Speech is rapid but not pressured. Well dressed and well groomed.     Depression screen [facility] 2/9 08/16/2017 07/05/2017 02/03/2017  Decreased Interest 1 3 0  Down, Depressed, Hopeless 1 2 0  PHQ - 2 Score 2 5 0  Altered sleeping 2 2 -  Tired, decreased energy 2 3 -  Change in appetite 1 1 -  Feeling bad or failure about yourself  1 1 -  Trouble concentrating 1 1 -  Moving slowly or fidgety/restless 0 0 -  Suicidal thoughts 0 0 -  PHQ-9 Score 9 13 -  Difficult doing work/chores Not difficult at all Somewhat difficult -   Ultrasound results: Bilateral thyroid nodules. No significant change in the dominant nodules. No new suspicious thyroid nodules. Assessment and Plan  1. Multiple thyroid nodules Stable. No current intervention or needed evaluation.  2. Depression, recurrent ([place]) Stable but still some anxiety and sleep disturbance. Defer giving prescription for ambien. Recommend taking the celexa 20 mg a day rather than 1/2 the tablet.  Exercise is recommended. Sleep hygiene  discussed.  Patient is agreeable to take the celexa 20 mg a day.  Suicide risks evaluated and documented in note if present or in the area below. Patient has protective factors of family and community support.  Patient reports that family believes is behaving rationally. Patient displays problem solving skills.   Patient specifically denies suicide ideation. Patient has access/information to healthcare contacts if situation or mood changes where patient is a risk to self or others or mood becomes unstable.  Patient understands the treatment plan and is in agreement. Agrees to keep follow up and call prior or return to clinic if needed.   3. Mild intermittent asthma without complication Stable. Continue allergy medication.  Follow up with pulmonary prn.   Tetanus shot deferred.  Pneumovax recommended and administered.  No follow-ups on file. [name], MD 08/16/2017

## 2017-08-17 MED ORDER — ATORVASTATIN CALCIUM 20 MG PO TABS: 20.0000 mg | ORAL_TABLET | Freq: Every day | ORAL | 1 refills | Status: DC

## 2017-08-17 MED ORDER — CITALOPRAM HYDROBROMIDE 20 MG PO TABS: 20.0000 mg | ORAL_TABLET | Freq: Every day | ORAL | 6 refills | Status: DC

## 2017-08-17 NOTE — Telephone Encounter (Signed)
Spoke with patient and let her know Celexa and Atorvastatin had been sent in for 6 months

## 2017-08-17 NOTE — Telephone Encounter (Signed)
You may advise that lipitor and celexa have been sent in for six months. Gwen Her. Mannie Stabile, MD

## 2017-09-08 ENCOUNTER — Other Ambulatory Visit: Payer: Self-pay | Admitting: Family Medicine

## 2017-09-08 DIAGNOSIS — J4520 Mild intermittent asthma, uncomplicated: Secondary | ICD-10-CM

## 2017-09-09 ENCOUNTER — Telehealth: Payer: Self-pay

## 2017-09-09 DIAGNOSIS — F339 Major depressive disorder, recurrent, unspecified: Secondary | ICD-10-CM

## 2017-09-09 NOTE — BH Specialist Note (Signed)
[facility] Telephone Follow-up  MRN: [id number] NAME: Amber Bird Date: Sep 22, 2017   Total time: 30 minutes Call number: 4/6  Reason for call today: Reason for Contact: PHQ9-4 weeks  PHQ-9 Scores:  Depression screen [facility] 2/9 09-22-17 08/16/2017 07/05/2017 02/03/2017  Decreased Interest 2 1 3  0  Down, Depressed, Hopeless 2 1 2  0  PHQ - 2 Score 4 2 5  0  Altered sleeping 3 2 2  -  Tired, decreased energy 2 2 3  -  Change in appetite 0 1 1 -  Feeling bad or failure about yourself  1 1 1  -  Trouble concentrating 1 1 1  -  Moving slowly or fidgety/restless 0 0 0 -  Suicidal thoughts 0 0 0 -  PHQ-9 Score 11 9 13  -  Difficult doing work/chores Somewhat difficult Not difficult at all Somewhat difficult -   GAD-7 Scores:  GAD 7 : Generalized Anxiety Score 09-22-17 07/05/2017  Nervous, Anxious, on Edge 2 1  Control/stop worrying 1 1  Worry too much - different things 2 2  Trouble relaxing 2 1  Restless 2 0  Easily annoyed or irritable 1 0  Afraid - awful might happen 1 1  Total GAD 7 Score 11 6  Anxiety Difficulty - Somewhat difficult    Stress Current stressors: Current Stressors: (Unable to sleep at night; Feels as if 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 atorvastatin (LIPITOR) 20 MG tablet Take 1 tablet (20 mg total) by mouth daily.  . cetirizine (ZYRTEC) 10 MG tablet Take 1 tablet (10 mg total) by mouth daily. (Patient taking differently: Take 10 mg by mouth daily as needed for allergies. )  . citalopram (CELEXA) 20 MG tablet Take 1 tablet (20 mg total) by mouth daily.  . polyethylene glycol powder (GLYCOLAX/MIRALAX) powder 1 scoop  daily or as needed  . triamcinolone (KENALOG) 0.025 % cream Apply 1 application topically as needed (dry skin).  . VENTOLIN HFA 108 (90 Base) MCG/ACT inhaler INHALE 2 PUFFS BY MOUTH EVERY 6 HOURS AS NEEDED FOR SHORTNESS OF BREATH   No facility-administered encounter medications on file as of September 22, 2017.      Self-harm Behaviors Risk Assessment Self-harm risk factors: Self-harm risk factors: (NA)  Patient endorses recent thoughts of harming self: Have you recently had any thoughts about harming yourself?: No  [place] Suicide Severity Rating Scale: No flowsheet data found. C-SRSS 2017-09-22  1. Wish to be Dead No  2. Suicidal Thoughts No  6. Suicide Behavior Question No     Danger to Others Risk Assessment Danger to others risk factors: Danger to Others Risk Factors: No risk factors noted Patient endorses recent thoughts of harming others: Notification required: No need or identified person    Substance Use Assessment Patient recently consumed alcohol: Have you recently consumed alcohol?: No  Alcohol Use Disorder Identification Test (AUDIT):  Alcohol Use Disorder Test (AUDIT) 2017-09-22  1. How often do you have a drink containing alcohol? 0  2. How many drinks containing alcohol do you have on a typical day when you are drinking? 0  3. How often do you have six or more drinks on one occasion? 0  AUDIT-C Score 0  Intervention/Follow-up AUDIT Score <7 follow-up not indicated  Patient recently used drugs: Have you recently used any drugs?: No    Goals, Interventions and Follow-up Plan Goals: Increase healthy adjustment to current life circumstances Interventions: Mindfulness or Relaxation Training and Supportive Counseling Follow-up Plan: [facility] Phone Follow Up   Summary:   Follow Up - Patient reports increased anxiety and depression because she feels as if the medication is not working for her.   Patient reports increased anxiety associated with she was having issues with her car  and a higher electric bill due to the weather.   Patient reports that her sleep is poor and she is not able to stay asleep because her mind is constantly worrying about her lack of finances. Patient denies SI/HI/Psychosis/Substance Abuse. If your symptoms worsen or you have thoughts of suicide/homicide, PLEASE SEEK IMMEDIATE MEDICAL ATTENTION.  You may always call:  National Suicide Hotline: 440-494-5964;  [place] Crisis Line: 779-082-8450;  Crisis Recovery in [place]: 912 036 7408.  These are available 24 hours a day, 7 days a week.  Patient reports that she will resume reading when she begins to feel anxious.  Patient reports that she has begun to walk on the treadmill as a means of self care.   During the next session patient will discuss the outcome of her applying to [facility] and the [employer] for employment.  Patient will go to the unemployment office to update her resume and see what type of jobs are available.    Graciella Freer [place], LCAS-A

## 2017-09-21 NOTE — Progress Notes (Signed)
Despite slight worsening in PHQ 9 and GAD 7, the patient believes it is situational and prefers to stay on current medication regimen. [facility] to continue to offer supportive therapy, coach behavioral activation.

## 2017-10-03 ENCOUNTER — Telehealth: Payer: Self-pay

## 2017-10-03 NOTE — Telephone Encounter (Signed)
[facility] - Left Msg 

## 2017-10-12 ENCOUNTER — Telehealth: Payer: Self-pay

## 2017-10-12 NOTE — Telephone Encounter (Signed)
2nd attempt - [facility]   

## 2017-10-17 ENCOUNTER — Encounter: Payer: Self-pay | Admitting: Clinical

## 2017-10-18 ENCOUNTER — Telehealth: Payer: Self-pay

## 2017-10-18 NOTE — Telephone Encounter (Signed)
3rd attempt - Patient reports that she was driving and she would have to call me back.  Writer provided the patient with the phone number [phone].

## 2017-11-01 ENCOUNTER — Telehealth: Payer: Self-pay

## 2017-11-01 NOTE — Telephone Encounter (Signed)
  Dear Amber Bird     You were referred to [facility] by Dr. Mannie Stabile at [facility].? Unfortunately, Dr. Mannie Stabile is leaving  the practice and the [name] will no longer be available to you since it is part of [name] services.?   If you decide to go to another primary care clinic within the [facility] system?that has?[employer], your services  can continue at that clinic.? Your current [facility] will end on November 01, 2017.    The following primary care clinics have [place]:    [facility] [phone] [facility])  [facility] [phone] [place])    You may access behavioral health services through the following community agencies:     [facility]  [name]   [place], [place] [address]  [phone]    [facility] [place]  [place], [place] [address]  [phone]    For mental health crisis assistance please contact:    [employer]  [phone]     [place]  [address]  [place], [place] [address]  [phone]

## 2017-12-09 DIAGNOSIS — J4520 Mild intermittent asthma, uncomplicated: Secondary | ICD-10-CM | POA: Diagnosis not present

## 2017-12-09 DIAGNOSIS — Z23 Encounter for immunization: Secondary | ICD-10-CM | POA: Diagnosis not present

## 2017-12-09 DIAGNOSIS — Z0000 Encounter for general adult medical examination without abnormal findings: Secondary | ICD-10-CM | POA: Diagnosis not present

## 2017-12-09 DIAGNOSIS — Z1159 Encounter for screening for other viral diseases: Secondary | ICD-10-CM | POA: Diagnosis not present

## 2017-12-09 DIAGNOSIS — R69 Illness, unspecified: Secondary | ICD-10-CM | POA: Diagnosis not present

## 2017-12-09 DIAGNOSIS — Z1239 Encounter for other screening for malignant neoplasm of breast: Secondary | ICD-10-CM | POA: Diagnosis not present

## 2017-12-09 DIAGNOSIS — Z79899 Other long term (current) drug therapy: Secondary | ICD-10-CM | POA: Diagnosis not present

## 2017-12-09 DIAGNOSIS — E782 Mixed hyperlipidemia: Secondary | ICD-10-CM | POA: Diagnosis not present

## 2017-12-23 ENCOUNTER — Other Ambulatory Visit: Payer: Self-pay | Admitting: Family Medicine

## 2017-12-23 DIAGNOSIS — J4520 Mild intermittent asthma, uncomplicated: Secondary | ICD-10-CM

## 2017-12-24 DIAGNOSIS — Z1231 Encounter for screening mammogram for malignant neoplasm of breast: Secondary | ICD-10-CM | POA: Diagnosis not present

## 2018-07-10 DIAGNOSIS — R69 Illness, unspecified: Secondary | ICD-10-CM | POA: Diagnosis not present

## 2018-07-25 ENCOUNTER — Other Ambulatory Visit: Payer: Self-pay | Admitting: Family Medicine

## 2018-07-25 DIAGNOSIS — Z8639 Personal history of other endocrine, nutritional and metabolic disease: Secondary | ICD-10-CM

## 2018-07-25 DIAGNOSIS — R221 Localized swelling, mass and lump, neck: Secondary | ICD-10-CM | POA: Diagnosis not present

## 2018-07-25 DIAGNOSIS — R1310 Dysphagia, unspecified: Secondary | ICD-10-CM | POA: Diagnosis not present

## 2018-07-25 DIAGNOSIS — R69 Illness, unspecified: Secondary | ICD-10-CM | POA: Diagnosis not present

## 2018-07-28 ENCOUNTER — Other Ambulatory Visit: Payer: Self-pay

## 2018-07-28 ENCOUNTER — Ambulatory Visit (INDEPENDENT_AMBULATORY_CARE_PROVIDER_SITE_OTHER): Payer: Medicare HMO | Admitting: Internal Medicine

## 2018-07-28 ENCOUNTER — Encounter: Payer: Self-pay | Admitting: Internal Medicine

## 2018-07-28 VITALS — BP 123/84 | HR 92 | Temp 97.6°F | Ht 63.0 in | Wt 173.0 lb

## 2018-07-28 DIAGNOSIS — R1310 Dysphagia, unspecified: Secondary | ICD-10-CM | POA: Diagnosis not present

## 2018-07-28 DIAGNOSIS — R1319 Other dysphagia: Secondary | ICD-10-CM

## 2018-07-28 DIAGNOSIS — K219 Gastro-esophageal reflux disease without esophagitis: Secondary | ICD-10-CM | POA: Diagnosis not present

## 2018-07-28 NOTE — Patient Instructions (Signed)
Plan for an EGD with possible esophageal dilation - propofol - GERD, dysphagia  Further recommendations to follow

## 2018-07-28 NOTE — Progress Notes (Signed)
Primary Care Physician:  [name], MD Primary Gastroenterologist:  Dr. Gala Romney  Pre-Procedure History & Physical: HPI:  Amber Bird is a 73 y.o. female here for further evaluation of some pain on swallowing from time to time, hiccups and vague sensation of food, medication "hanging up" behind her breastbone.  She does describe  heartburn on a somewhat regular basis.  Has not had any nausea or vomiting.  Apparently, continues to be concerned about nodules in her neck.  She has a history of benign thyroid nodules and is up-to-date on thyroid evaluation.  Patient denies abdominal pain or change in bowel function.  Specifically,  denies melena or rectal bleeding. Patient denies weight loss.  No NSAIDs.  I saw this nice lady 2017 for a screening colonoscopy she was found to have only diverticulosis.  Future colonoscopy not recommended unless new symptoms develop.  No history of peptic ulcer disease.  Gallbladder is out.  She is never had an upper GI tract evaluation via EGD, etc.  No tobacco or alcohol exposure.   Past Medical History:  Diagnosis Date  . Complication of anesthesia    "drop in blood pressure years ago after hysterectomy "   . History of bleeding ulcers   . Hypercholesteremia   . Multiple thyroid nodules   . Seasonal allergies     Past Surgical History:  Procedure Laterality Date  . ABDOMINAL HYSTERECTOMY     complete.   . COLONOSCOPY N/A 12/18/2015   Alister Staver: diverticulosis in sigmoid colon, otherwise normal   . Cyst removed from ovary  1973  . LAPAROSCOPIC CHOLECYSTECTOMY SINGLE SITE WITH INTRAOPERATIVE CHOLANGIOGRAM N/A 05/12/2016   Procedure: LAPAROSCOPIC CHOLECYSTECTOMY SINGLE SITE WITH INTRAOPERATIVE CHOLANGIOGRAM;  Surgeon: Michael Boston, MD;  Location: [facility];  Service: General;  Laterality: N/A;  . left hammer toe repair    . thyroid needle biopsy    . VIDEO BRONCHOSCOPY Bilateral 03/21/2017   Procedure: VIDEO BRONCHOSCOPY WITH FLUORO;  Surgeon:  Juanito Doom, MD;  Location: [facility];  Service: Cardiopulmonary;  Laterality: Bilateral;    Prior to Admission medications   Medication Sig Start Date End Date Taking? Authorizing Provider  atorvastatin (LIPITOR) 20 MG tablet Take 1 tablet (20 mg total) by mouth daily. 08/17/17  Yes [name], MD  cetirizine (ZYRTEC) 10 MG tablet Take 1 tablet (10 mg total) by mouth daily. Patient taking differently: Take 10 mg by mouth daily as needed for allergies.  03/03/17  Yes Hagler, Apolonio Schneiders, MD  citalopram (CELEXA) 40 MG tablet Take 40 mg by mouth daily.   Yes [provider]  VENTOLIN HFA 108 (90 Base) MCG/ACT inhaler INHALE 2 PUFFS BY MOUTH EVERY 6 HOURS AS NEEDED FOR SHORTNESS OF BREATH 09/08/17  Yes Hagler, Apolonio Schneiders, MD  citalopram (CELEXA) 20 MG tablet Take 1 tablet (20 mg total) by mouth daily. Patient not taking: Reported on 07/28/2018 08/17/17   [name], MD  polyethylene glycol powder [facility]) powder 1 scoop daily or as needed Patient not taking: Reported on 07/28/2018 07/14/17   Florian Buff, MD  triamcinolone (KENALOG) 0.025 % cream Apply 1 application topically as needed (dry skin).    [provider]    Allergies as of 07/28/2018  . (No Known Allergies)    Family History  Problem Relation Age of Onset  . Colon cancer Other   . Breast cancer Mother   . Tuberculosis Mother   . Heart disease Father     Social History   Socioeconomic History  .  Marital status: Divorced    Spouse name: Not on file  . Number of children: Not on file  . Years of education: Not on file  . Highest education level: Not on file  Occupational History  . Not on file  Social Needs  . Financial resource strain: Not on file  . Food insecurity    Worry: Not on file    Inability: Not on file  . Transportation needs    Medical: Not on file    Non-medical: Not on file  Tobacco Use  . Smoking status: Never Smoker  . Smokeless tobacco: Never Used  Substance and  Sexual Activity  . Alcohol use: No  . Drug use: No  . Sexual activity: Not Currently    Birth control/protection: None  Lifestyle  . Physical activity    Days per week: Not on file    Minutes per session: Not on file  . Stress: Not on file  Relationships  . Social Herbalist on phone: Not on file    Gets together: Not on file    Attends religious service: Not on file    Active member of club or organization: Not on file    Attends meetings of clubs or organizations: Not on file    Relationship status: Not on file  . Intimate partner violence    Fear of current or ex partner: Not on file    Emotionally abused: Not on file    Physically abused: Not on file    Forced sexual activity: Not on file  Other Topics Concern  . Not on file  Social History Narrative   Moved from [facility] [place]. In 2008 when the economy dropped lost job. Reports that after lost job was hard to get another job.  Is not married and does not have children. Used to enjoy horses. Reports that moved here b/c it was a lot cheaper and warmer. Had visited here for a horse event in 1990 so decided to move here. Lives in [place], [place]. Reports that made stupid financial decisions and is dealing with that at this time.     Review of Systems: See HPI, otherwise negative ROS  Physical Exam: BP 123/84   Pulse 92   Temp 97.6 F (36.4 C) (Oral)   Ht 5\' 3"  (1.6 m)   Wt 173 lb (78.5 kg)   BMI 30.65 kg/m  General:   Alert,   pleasant and cooperative in NAD; somewhat anxious. Neck: Anterior cervical area nontender to palpation.  No discrete mass appreciated today  Lungs:  Clear throughout to auscultation.   No wheezes, crackles, or rhonchi. No acute distress. Heart:  Regular rate and rhythm; no murmurs, clicks, rubs,  or gallops. Abdomen: Non-distended, normal bowel sounds.  Soft and nontender without appreciable mass or hepatosplenomegaly.  Pulses:  Normal pulses noted. Extremities:  Without clubbing or edema.   Impression/Plan: 73 year old lady presents with vague symptoms of odynophagia and esophageal dysphagia to solids.  Chronic complaints of nodules in her neck.  Her prior work-up reassuring.  She does have baseline symptoms of intermittent GERD.  I agree with Dr. Mannie Stabile, further evaluation of new upper GI tract symptoms warranted.  Recommendations: I have offered the patient a diagnostic EGD with possible esophageal dilation as feasible/appropriate per plan. The risks, benefits, limitations, alternatives and imponderables have been reviewed with the patient. Potential for esophageal dilation, biopsy, etc. have also been reviewed.  Questions have been answered. All parties agreeable.  Further recommendations  to follow.   Notice: This dictation was prepared with Dragon dictation along with smaller phrase technology. Any transcriptional errors that result from this process are unintentional and may not be corrected upon review.

## 2018-07-31 ENCOUNTER — Telehealth: Payer: Self-pay

## 2018-07-31 ENCOUNTER — Other Ambulatory Visit: Payer: Self-pay

## 2018-07-31 DIAGNOSIS — R1319 Other dysphagia: Secondary | ICD-10-CM

## 2018-07-31 DIAGNOSIS — K219 Gastro-esophageal reflux disease without esophagitis: Secondary | ICD-10-CM

## 2018-07-31 DIAGNOSIS — R1310 Dysphagia, unspecified: Secondary | ICD-10-CM

## 2018-07-31 NOTE — Telephone Encounter (Signed)
I spoke with the patient and she is going to call her social worker and see if she can help with transportation. Patient is scheduled for 8/31 at 11 am

## 2018-07-31 NOTE — Telephone Encounter (Signed)
Orders entered for procedure

## 2018-07-31 NOTE — Telephone Encounter (Signed)
Called pt to schedule EGD/-/+ DIL w/Propofol w/RMR. Holding spot on schedule for 10/02/18 at 12:45pm. Pt doesn't have anyone to take her or be with her for procedure. RMR advised pt that office manager would contact her [id number]. Routing to CM.

## 2018-07-31 NOTE — Telephone Encounter (Signed)
Pre-op appt 09/28/18 at 8:00am. Appt letter mailed with procedure instructions.

## 2018-07-31 NOTE — Telephone Encounter (Signed)
I tried to call the patient, no answer  

## 2018-08-02 ENCOUNTER — Other Ambulatory Visit (HOSPITAL_COMMUNITY): Payer: Self-pay | Admitting: Family Medicine

## 2018-08-02 DIAGNOSIS — Z8639 Personal history of other endocrine, nutritional and metabolic disease: Secondary | ICD-10-CM

## 2018-08-09 ENCOUNTER — Other Ambulatory Visit: Payer: Self-pay

## 2018-08-09 ENCOUNTER — Ambulatory Visit (HOSPITAL_COMMUNITY)
Admission: RE | Admit: 2018-08-09 | Discharge: 2018-08-09 | Disposition: A | Discharge status: Home / Self Care | Payer: Medicare HMO | Source: Ambulatory Visit | Attending: Family Medicine | Admitting: Family Medicine

## 2018-08-09 DIAGNOSIS — Z8639 Personal history of other endocrine, nutritional and metabolic disease: Secondary | ICD-10-CM | POA: Diagnosis not present

## 2018-08-09 DIAGNOSIS — E041 Nontoxic single thyroid nodule: Secondary | ICD-10-CM | POA: Diagnosis not present

## 2018-08-24 ENCOUNTER — Telehealth: Payer: Self-pay

## 2018-08-24 NOTE — Telephone Encounter (Signed)
Pt called with c/o a fullness feeling when she eats. Pt drinks milk with her meals and isn't sure if she should try to avoid the milk. After discussing if pt has tried different foods or playing process of i, pt is going to start keeping a food diary to see if she can find the foods that trigger the way she feels. Pt mentioned she wants to make sure nothing is wrong with her and then states that she isn't sure she will keep her EGD.  Pt was seen 07/2018 by RMR and scheduled for an EGD at the end of August 2020. Pt doesn't have anyone to bring her to her EGD apt. Pt is also nervous to ride in the car with other people during Covid-19. Pt is going to keep the EGD apt for now.

## 2018-08-31 DIAGNOSIS — R8279 Other abnormal findings on microbiological examination of urine: Secondary | ICD-10-CM | POA: Diagnosis not present

## 2018-08-31 DIAGNOSIS — R102 Pelvic and perineal pain: Secondary | ICD-10-CM | POA: Diagnosis not present

## 2018-08-31 DIAGNOSIS — Q632 Ectopic kidney: Secondary | ICD-10-CM | POA: Diagnosis not present

## 2018-08-31 DIAGNOSIS — Q6211 Congenital occlusion of ureteropelvic junction: Secondary | ICD-10-CM | POA: Diagnosis not present

## 2018-08-31 DIAGNOSIS — K5710 Diverticulosis of small intestine without perforation or abscess without bleeding: Secondary | ICD-10-CM | POA: Diagnosis not present

## 2018-09-06 ENCOUNTER — Telehealth: Payer: Self-pay | Admitting: Internal Medicine

## 2018-09-06 NOTE — Telephone Encounter (Signed)
Pt called asking to speak with AM. Please call [phone]

## 2018-09-06 NOTE — Telephone Encounter (Signed)
Noted. Pt is aware of RMR's recommendations.

## 2018-09-06 NOTE — Telephone Encounter (Signed)
Pt called with c/o constant abdominal pain and bloating. Pt was seen in our office on 07/2018 for Mullen with esophagitis present. Pt is taking Miralax daily so she's able to have a bowel movmenmt Pt is scheduled for an EGD 10/02/18. Pt had a CT scan last Thursday with [facility]. Pt asked [facility] to to send records to Her PCP Dr. Mannie Stabile and RMR. Pt called Dr. Roma Kayser office and spoke with Dr. Rockwell Germany due to the constant abdominal pain she is in. Dr. Rockwell Germany informed pt that Dr. Mannie Stabile wasn't in the office and pt should have a MRI at the ED since the CT scan might not show the cause of pts pain per Dr. Rockwell Germany. Pt doesn't want to go to the ED due to Covid-19 and wants RMR to order an MRI for her.

## 2018-09-06 NOTE — Telephone Encounter (Signed)
I have no information to review.  Sounds like patient is in crisis.  I recommend she go to the ED.  I am not ordering any x-rays with patient not being seen.  Sounds like a new problem.

## 2018-09-06 NOTE — Telephone Encounter (Signed)
Defer to Dr. Gala Romney who has seen the patient recently.

## 2018-09-07 ENCOUNTER — Encounter (HOSPITAL_COMMUNITY): Payer: Self-pay | Admitting: Emergency Medicine

## 2018-09-07 ENCOUNTER — Emergency Department (HOSPITAL_COMMUNITY)
Admission: EM | Admit: 2018-09-07 | Discharge: 2018-09-07 | Disposition: A | Discharge status: Home / Self Care | Payer: Medicare HMO | Attending: Emergency Medicine | Admitting: Emergency Medicine

## 2018-09-07 ENCOUNTER — Other Ambulatory Visit: Payer: Self-pay

## 2018-09-07 ENCOUNTER — Emergency Department (HOSPITAL_COMMUNITY): Payer: Medicare HMO

## 2018-09-07 DIAGNOSIS — R1030 Lower abdominal pain, unspecified: Secondary | ICD-10-CM | POA: Diagnosis present

## 2018-09-07 DIAGNOSIS — K5900 Constipation, unspecified: Secondary | ICD-10-CM | POA: Insufficient documentation

## 2018-09-07 DIAGNOSIS — R109 Unspecified abdominal pain: Secondary | ICD-10-CM | POA: Diagnosis not present

## 2018-09-07 DIAGNOSIS — Z79899 Other long term (current) drug therapy: Secondary | ICD-10-CM | POA: Insufficient documentation

## 2018-09-07 LAB — URINALYSIS, ROUTINE W REFLEX MICROSCOPIC
Bilirubin Urine: NEGATIVE
Glucose, UA: NEGATIVE mg/dL
Hgb urine dipstick: NEGATIVE
Ketones, ur: NEGATIVE mg/dL
Leukocytes,Ua: NEGATIVE
Nitrite: NEGATIVE
Protein, ur: NEGATIVE mg/dL
Specific Gravity, Urine: 1.003 — ABNORMAL LOW (ref 1.005–1.030)
pH: 6 (ref 5.0–8.0)

## 2018-09-07 LAB — COMPREHENSIVE METABOLIC PANEL
ALT: 21 U/L (ref 0–44)
AST: 31 U/L (ref 15–41)
Albumin: 4.2 g/dL (ref 3.5–5.0)
Alkaline Phosphatase: 78 U/L (ref 38–126)
Anion gap: 7 (ref 5–15)
BUN: 9 mg/dL (ref 8–23)
CO2: 26 mmol/L (ref 22–32)
Calcium: 9.4 mg/dL (ref 8.9–10.3)
Chloride: 101 mmol/L (ref 98–111)
Creatinine, Ser: 0.83 mg/dL (ref 0.44–1.00)
GFR calc Af Amer: >60 mL/min (ref >60)
GFR calc non Af Amer: >60 mL/min (ref >60)
Glucose, Bld: 100 mg/dL — ABNORMAL HIGH (ref 70–99)
Potassium: 3.9 mmol/L (ref 3.5–5.1)
Sodium: 134 mmol/L — ABNORMAL LOW (ref 135–145)
Total Bilirubin: 0.8 mg/dL (ref 0.3–1.2)
Total Protein: 7.1 g/dL (ref 6.5–8.1)

## 2018-09-07 LAB — CBC WITH DIFFERENTIAL/PLATELET
Abs Immature Granulocytes: 0.04 10*3/uL (ref 0.00–0.07)
Basophils Absolute: 0 10*3/uL (ref 0.0–0.1)
Basophils Relative: 1 %
Eosinophils Absolute: 0.1 10*3/uL (ref 0.0–0.5)
Eosinophils Relative: 2 %
HCT: 43.5 % (ref 36.0–46.0)
Hemoglobin: 14.1 g/dL (ref 12.0–15.0)
Immature Granulocytes: 1 %
Lymphocytes Relative: 9 %
Lymphs Abs: 0.5 10*3/uL — ABNORMAL LOW (ref 0.7–4.0)
MCH: 29.8 pg (ref 26.0–34.0)
MCHC: 32.4 g/dL (ref 30.0–36.0)
MCV: 92 fL (ref 80.0–100.0)
Monocytes Absolute: 0.5 10*3/uL (ref 0.1–1.0)
Monocytes Relative: 9 %
Neutro Abs: 4.1 10*3/uL (ref 1.7–7.7)
Neutrophils Relative %: 78 %
Platelets: 258 10*3/uL (ref 150–400)
RBC: 4.73 MIL/uL (ref 3.87–5.11)
RDW: 13.1 % (ref 11.5–15.5)
WBC: 5.2 10*3/uL (ref 4.0–10.5)
nRBC: 0 % (ref 0.0–0.2)

## 2018-09-07 LAB — LIPASE, BLOOD: Lipase: 77 U/L — ABNORMAL HIGH (ref 11–51)

## 2018-09-07 MED ORDER — IOHEXOL 300 MG/ML  SOLN
100.0000 mL | Freq: Once | INTRAMUSCULAR | Status: AC | PRN
  Administered 2018-09-07: 12:00:00 100 mL via INTRAVENOUS

## 2018-09-07 NOTE — ED Notes (Signed)
ED Provider at bedside. 

## 2018-09-07 NOTE — ED Triage Notes (Signed)
Patient states bloating has been going on a while, saw doctor on June 23rd for pain and now pain is getting worse.  Pain around lower abd area.

## 2018-09-07 NOTE — Discharge Instructions (Addendum)
Your CT scan today shows that you are constipated.  Continue drinking plenty of water and try eating a high-fiber diet.  You can take over the counter laxative such as milk or magnesia or Ducolax Pink as directed for 1-2 days until you feel that your constipation is relieved.  Be sure to follow-up with your GI provider for recheck.  Return here for any worsening symptoms such as fever, vomiting or increasing pain

## 2018-09-07 NOTE — ED Provider Notes (Signed)
[facility] EMERGENCY DEPARTMENT Provider Note   CSN: [id number] Arrival date & time: 09/07/18  6962     History   Chief Complaint No chief complaint on file.   HPI Amber Bird is a 73 y.o. female.     HPI  Amber Bird is a 73 y.o. female who presents to the Emergency Department complaining of swollen, painful lower abdomen.  Symptoms have have gradually progressing for 6 months.  She states that she was seen by her urologist recently and had a non-contrasted CT of her abdomen without significant findings.  She describes worsening pain for several days.  She denies nausea, vomiting, diarrhea or other changes to her stool.  She states that she always "feels bloated." symptoms are worse when she drinks water.  Pan does not radiate.  She denies fever, chills, chest pain, difficulty swallowing.  She is scheduled for EGD 10/02/18 by Dr. Gala Romney.       Past Medical History:  Diagnosis Date  . Complication of anesthesia    "drop in blood pressure years ago after hysterectomy "   . History of bleeding ulcers   . Hypercholesteremia   . Multiple thyroid nodules   . Seasonal allergies     Patient Active Problem List   Diagnosis Date Noted  . Chronic cholecystitis with calculus s/p lap cholecystectomy 05/12/2016 05/12/2016  . History of bleeding ulcers   . Multiple thyroid nodules   . Seasonal allergies   . Lower abdominal pain 01/18/2016    Past Surgical History:  Procedure Laterality Date  . ABDOMINAL HYSTERECTOMY     complete.   . COLONOSCOPY N/A 12/18/2015   Rourk: diverticulosis in sigmoid colon, otherwise normal   . Cyst removed from ovary  1973  . LAPAROSCOPIC CHOLECYSTECTOMY SINGLE SITE WITH INTRAOPERATIVE CHOLANGIOGRAM N/A 05/12/2016   Procedure: LAPAROSCOPIC CHOLECYSTECTOMY SINGLE SITE WITH INTRAOPERATIVE CHOLANGIOGRAM;  Surgeon: Michael Boston, MD;  Location: [facility];  Service: General;  Laterality: N/A;  . left hammer toe repair    . thyroid needle biopsy    .  VIDEO BRONCHOSCOPY Bilateral 03/21/2017   Procedure: VIDEO BRONCHOSCOPY WITH FLUORO;  Surgeon: Juanito Doom, MD;  Location: [facility];  Service: Cardiopulmonary;  Laterality: Bilateral;     OB History    Gravida  0   Para  0   Term  0   Preterm  0   AB  0   Living  0     SAB  0   TAB  0   Ectopic  0   Multiple  0   Live Births  0            Home Medications    Prior to Admission medications   Medication Sig Start Date End Date Taking? Authorizing Provider  atorvastatin (LIPITOR) 20 MG tablet Take 1 tablet (20 mg total) by mouth daily. 08/17/17   [name], MD  cetirizine (ZYRTEC) 10 MG tablet Take 1 tablet (10 mg total) by mouth daily. Patient taking differently: Take 10 mg by mouth daily as needed for allergies.  03/03/17   [name], MD  citalopram (CELEXA) 20 MG tablet Take 1 tablet (20 mg total) by mouth daily. Patient not taking: Reported on 07/28/2018 08/17/17   [name], MD  citalopram (CELEXA) 40 MG tablet Take 40 mg by mouth daily.    [provider]  polyethylene glycol powder (GLYCOLAX/MIRALAX) powder 1 scoop daily or as needed Patient not taking: Reported on 07/28/2018 07/14/17   Eure,  Mertie Clause, MD  triamcinolone (KENALOG) 0.025 % cream Apply 1 application topically as needed (dry skin).    [provider]  VENTOLIN HFA 108 (90 Base) MCG/ACT inhaler INHALE 2 PUFFS BY MOUTH EVERY 6 HOURS AS NEEDED FOR SHORTNESS OF BREATH 09/08/17   [name], MD    Family History Family History  Problem Relation Age of Onset  . Colon cancer Other   . Breast cancer Mother   . Tuberculosis Mother   . Heart disease Father     Social History Social History   Tobacco Use  . Smoking status: Never Smoker  . Smokeless tobacco: Never Used  Substance Use Topics  . Alcohol use: No  . Drug use: No     Allergies   Patient has no known allergies.   Review of Systems Review of Systems  Constitutional: Negative for appetite  change, chills and fever.  HENT: Negative for trouble swallowing.   Respiratory: Negative for shortness of breath.   Cardiovascular: Negative for chest pain.  Gastrointestinal: Positive for abdominal distention and abdominal pain. Negative for blood in stool, constipation, diarrhea, nausea and vomiting.  Genitourinary: Negative for decreased urine volume, difficulty urinating, dysuria and flank pain.  Musculoskeletal: Negative for back pain.  Skin: Negative for color change and rash.  Neurological: Negative for dizziness, weakness and numbness.  Hematological: Negative for adenopathy.     Physical Exam Updated Vital Signs BP 129/77 (BP Location: Right Arm)   Pulse 76   Temp 97.9 F (36.6 C) (Oral)   Resp 18   Ht 5' 3.5" (1.613 m)   Wt 76.2 kg   SpO2 99%   BMI 29.29 kg/m   Physical Exam Vitals signs and nursing note reviewed.  Constitutional:      Appearance: Normal appearance. She is not ill-appearing or toxic-appearing.  HENT:     Mouth/Throat:     Mouth: Mucous membranes are moist.  Neck:     Musculoskeletal: Normal range of motion.  Cardiovascular:     Rate and Rhythm: Normal rate and regular rhythm.     Pulses: Normal pulses.  Pulmonary:     Effort: Pulmonary effort is normal.     Breath sounds: Normal breath sounds.  Chest:     Chest wall: No tenderness.  Abdominal:     General: There is no distension.     Palpations: Abdomen is soft. There is no mass.     Tenderness: There is no abdominal tenderness. There is no right CVA tenderness, left CVA tenderness, guarding or rebound.  Musculoskeletal:     Right lower leg: No edema.     Left lower leg: No edema.  Lymphadenopathy:     Cervical: No cervical adenopathy.  Skin:    General: Skin is warm.     Capillary Refill: Capillary refill takes less than 2 seconds.     Findings: No rash.  Neurological:     General: No focal deficit present.  Psychiatric:        Mood and Affect: Mood normal.      ED  Treatments / Results  Labs (all labs ordered are listed, but only abnormal results are displayed) Labs Reviewed  COMPREHENSIVE METABOLIC PANEL - Abnormal; Notable for the following components:      Result Value   Sodium 134 (*)    Glucose, Bld 100 (*)    All other components within normal limits  CBC WITH DIFFERENTIAL/PLATELET - Abnormal; Notable for the following components:   Lymphs Abs 0.5 (*)  All other components within normal limits  LIPASE, BLOOD - Abnormal; Notable for the following components:   Lipase 77 (*)    All other components within normal limits  URINALYSIS, ROUTINE W REFLEX MICROSCOPIC - Abnormal; Notable for the following components:   Color, Urine STRAW (*)    Specific Gravity, Urine 1.003 (*)    All other components within normal limits    EKG None  Radiology Ct Abdomen Pelvis W Contrast  Result Date: 09/07/2018 CLINICAL DATA:  Abdominal pain, acute, lower abdomen normal in for 6 weeks EXAM: CT ABDOMEN AND PELVIS WITH CONTRAST TECHNIQUE: Multidetector CT imaging of the abdomen and pelvis was performed using the standard protocol following bolus administration of intravenous contrast. CONTRAST:  146mL OMNIPAQUE IOHEXOL 300 MG/ML  SOLN COMPARISON:  CT August 31, 2018. FINDINGS: Lower chest: The visualized heart size within normal limits. No pericardial fluid/thickening. No hiatal hernia. A streaky atelectasis or scarring seen at the right lung base. Tiny calcified lymph node seen posterior to the esophagus and at the left hilum. Hepatobiliary: The liver is normal in density without focal abnormality.The main portal vein is patent. The patient is status post cholecystectomy. No biliary ductal dilation. A Pancreas: Unremarkable. No pancreatic ductal dilatation or surrounding inflammatory changes. Spleen: Normal in size without focal abnormality. Adrenals/Urinary Tract: Both adrenal glands appear normal. 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 No renal calculi or  hydronephrosis is seen. No perinephric stranding. Stomach/Bowel: The stomach, small bowel are normal in appearance. There is a duodenal diverticulum. Again noted is a moderate to large amount of colonic stool especially within the right colon. There is air and stool to the level the rectum. No inflammatory changes, wall thickening, or obstructive findings.The appendix is normal. Vascular/Lymphatic: There are no enlarged mesenteric, retroperitoneal, or pelvic lymph nodes. Scattered aortic atherosclerotic calcifications are seen without aneurysmal dilatation. Reproductive: the patient is status post hysterectomy. No adnexal masses or collections seen. Other: No evidence of abdominal wall mass or hernia. Musculoskeletal: No acute or significant osseous findings. IMPRESSION: Moderate to large amount of colonic stool, especially within the right colon as seen on the prior exam. No definite evidence of obstruction. Electronically Signed   By: Prudencio Pair M.D.   On: 09/07/2018 12:03    Procedures Procedures (including critical care time)  Medications Ordered in ED Medications - No data to display   Initial Impression / Assessment and Plan / ED Course  I have reviewed the triage vital signs and the nursing notes.  Pertinent labs & imaging results that were available during my care of the patient were reviewed by me and considered in my medical decision making (see chart for details).        Well appearing 73 yo.  abd is soft and w/o significant tenderness on exam.  CT abd/pelvis shows moderate to lg amt of colonic stool.  I have discussed importance of dietary fiber and water.  Pt appears appropriate for d/c home and agrees to OTC laxative, recommended Ducolax or MOM  and then daily Metamucil prn.  She has appt with GI later this month.  Return precautions discussed and also discussed findings with Dr. Wilson Singer.    Final Clinical Impressions(s) / ED Diagnoses   Final diagnoses:  Constipation,  unspecified constipation type    ED Discharge Orders    None       Kem Parkinson, PA-C 09/07/18 1251    Virgel Manifold, MD 09/08/18 [id number]

## 2018-09-11 DIAGNOSIS — I700 Atherosclerosis of aorta: Secondary | ICD-10-CM | POA: Diagnosis not present

## 2018-09-11 DIAGNOSIS — K5900 Constipation, unspecified: Secondary | ICD-10-CM | POA: Diagnosis not present

## 2018-09-11 DIAGNOSIS — Z8639 Personal history of other endocrine, nutritional and metabolic disease: Secondary | ICD-10-CM | POA: Diagnosis not present

## 2018-09-11 DIAGNOSIS — R69 Illness, unspecified: Secondary | ICD-10-CM | POA: Diagnosis not present

## 2018-09-14 ENCOUNTER — Telehealth: Payer: Self-pay

## 2018-09-14 ENCOUNTER — Telehealth: Payer: Self-pay | Admitting: Internal Medicine

## 2018-09-14 NOTE — Telephone Encounter (Signed)
Pt called office to find out time of procedure 10/02/18. Informed her of time. She spoke to a neighbor, but neighbor will not be able to take her at time of procedure. Informed her to call back if she needs to reschedule.

## 2018-09-14 NOTE — Telephone Encounter (Signed)
My response to the question is no.  Need to find traditional means back-and-forth from the hospital keeping policy in mind.  No justification for babysitting the patient overnight.

## 2018-09-14 NOTE — Telephone Encounter (Signed)
Called patient. I asked her if she asked her medicaid social worker if she would be available to take her. She states she was never advised to ask her Education officer, museum. I advised her my manager advised her of this at the last conversation. She will call her medicaid social worker to see if they could assist in providing transportation. Patient aware she will not be able to be admitted after procedure so she can drive herself home the next day. She needs to find transportation to have procedure done.  Patient states she doesn't even speak to her neighbors and don't even know their last names. I advised patient again to call her socially worker. I did advise her if she drives herself to the procedure it will be cancelled. She voiced understanding.

## 2018-09-14 NOTE — Telephone Encounter (Signed)
Transportation(and appropriate supervision post procedure) needs to be worked out before anything else is done.

## 2018-09-14 NOTE — Telephone Encounter (Signed)
VM received, pt is asking for a return call at [phone].

## 2018-09-14 NOTE — Telephone Encounter (Signed)
Nira Conn (Education officer, museum at [facility]) called to say that the patient is scheduled a colonoscopy on 8/31 with RMR. Patient does not have any friends, family or neighbors that can drive her to and from her procedure. Nira Conn said she had sent messages to RMR but no reply. She is asking if the patient can drive herself to the procedure, be admitted over night and drive herself back home the next day. Please advise. The social worker said it was no need to contact her back, but to touch base with the patient. [phone]

## 2018-09-15 NOTE — Telephone Encounter (Signed)
H/o Gerd, dysphagia and constipation. Pt had a CT scan last Thursday 09/07/2018 at [facility] and is scheduled for an EGE 10/02/18. Pt was told that she was full of stool. Pt's bowels started to move on Saturday 09/09/2018 when she took a Doculax in addition to the [place] and Miralax.. Pt continues to take benefiber (2 teaspoons), Miralax (Powder) daily with lots of water. Pt's stool looked like small marbles and turned into loose stool.  Pt feels bloated constantly and says after Sunday, she hasn't had a good bowel movement. Pt states that when she uses the bathroom, only small pieces come out. Pt isn't able to have a bowel movement and isn't sure of what she should take to help her stool move.

## 2018-09-15 NOTE — Telephone Encounter (Signed)
Go ahead and provide her sample kit today for colonoscopy purge.  Of course, no colonoscopy.  Just need a good cleanout.  May use once available in our sample closet.  After this is consumed today she can start taking MiraLAX 17 g orally every day beginning tomorrow.  Take it every day whether she thinks she needs it or not.

## 2018-09-18 NOTE — Telephone Encounter (Signed)
Reviewed

## 2018-09-18 NOTE — Telephone Encounter (Signed)
Tried calling pt. VM isn't set up. Will call pt back. There aren't any samples available.

## 2018-09-19 NOTE — Telephone Encounter (Signed)
Pt is aware to do the Miralax purge and will let us know how it goes .

## 2018-09-19 NOTE — Telephone Encounter (Signed)
PT is aware of the plan for colon cleanse.  We have no samples.  Amber Bird, can you please send in an Rx for colon cleanse?

## 2018-09-19 NOTE — Telephone Encounter (Signed)
To save money on prescription as we don't have samples, she can take 1 capful of Miralax with 8 ounces of water every hour on the hour for 6 doses. Ensure drinking full amount of water. May repeat tomorrow if needed.

## 2018-09-25 ENCOUNTER — Telehealth: Payer: Self-pay | Admitting: Internal Medicine

## 2018-09-25 ENCOUNTER — Telehealth: Payer: Self-pay | Admitting: Gastroenterology

## 2018-09-25 NOTE — Telephone Encounter (Signed)
I agree and will explain that to the patient if she calls back

## 2018-09-25 NOTE — Telephone Encounter (Signed)
See prior notes, patient aware she needs a driver.  I called patient. She states she doesn't have transportation. I advised if she can't find transportation her procedure will be cancelled. She states I feel like you are punishing me and it's not my fault. Patient then disconnected the call. Fowarding to [place] to advise.

## 2018-09-25 NOTE — Telephone Encounter (Signed)
We have policies/protocols in place for best practice and pt safety which should always be followed

## 2018-09-25 NOTE — Telephone Encounter (Signed)
I tried to call the patient, no answer and I was unable to leave a message on her voicemail.

## 2018-09-25 NOTE — Telephone Encounter (Signed)
I spoke with Ronny Bacon, social worker for [facility] and she is working on getting a Psychologist, occupational to help take a patient to her appt.

## 2018-09-25 NOTE — Telephone Encounter (Signed)
[place], SHE HAS NO ONE TO TAKE HER FOR HER PROCEDURE

## 2018-09-25 NOTE — Telephone Encounter (Signed)
Amber Bird Per RMR can you call Dr. Roderic Palau and see if the patient can be admitted to have her procedure?  She doesn't have a responsible party to care for her after her procedure.  Routing to [employer]

## 2018-09-25 NOTE — Telephone Encounter (Signed)
I spoke with Dr. Roderic Palau and relayed situation. As patient does not have a safe plan for discharge from EGD, she can be admitted post-procedure for observation with discharge the next morning.   When EGD is completed, will need to call [phone] (admit line for [facility]), and this will be assigned to the appropriate admitting hospitalist. Bed control #: [phone], opt 6.   Cyril Mourning and I are both in the hospital that day and can help facilitate this: we will just need to know when patient's procedure is completed.

## 2018-09-25 NOTE — Telephone Encounter (Signed)
PATIENT CALLED AND SAID THAT IF THEY KEEP HER UNDER OBSERVATION SHE SHOULD BE ABLE TO DRIVE HERSELF.  [phone]

## 2018-09-25 NOTE — Telephone Encounter (Signed)
Opened in error

## 2018-09-25 NOTE — Telephone Encounter (Signed)
Per Ronny Bacon they have run out of all resources and can't offer any help with transportation.

## 2018-09-26 NOTE — Telephone Encounter (Signed)
I'd like this lady to back off to clear liquids beginning with supper the night before to insure a n empty stomach for EGD

## 2018-09-27 ENCOUNTER — Encounter (HOSPITAL_COMMUNITY): Payer: Self-pay

## 2018-09-27 NOTE — Telephone Encounter (Signed)
[facility] clinical pool: please let patient know to only have clear liquids at supper the night before the EGD to ensure stomach adequately visualized.

## 2018-09-27 NOTE — Telephone Encounter (Signed)
Noted  

## 2018-09-27 NOTE — Telephone Encounter (Signed)
Called pt. Received message wireless customer is N/A please try your call again later

## 2018-09-27 NOTE — Telephone Encounter (Signed)
Patient called back and I made her aware. She stated she was aware and that she probably will start clear liquids earlier than that has her system is a little slower than others.

## 2018-09-28 ENCOUNTER — Encounter (HOSPITAL_COMMUNITY)
Admission: RE | Admit: 2018-09-28 | Discharge: 2018-09-28 | Disposition: A | Discharge status: Home / Self Care | Payer: Medicare HMO | Source: Ambulatory Visit | Attending: Internal Medicine | Admitting: Internal Medicine

## 2018-09-28 ENCOUNTER — Other Ambulatory Visit (HOSPITAL_COMMUNITY)
Admission: RE | Admit: 2018-09-28 | Discharge: 2018-09-28 | Disposition: A | Discharge status: Home / Self Care | Payer: Medicare HMO | Source: Ambulatory Visit | Attending: Internal Medicine | Admitting: Internal Medicine

## 2018-09-28 ENCOUNTER — Other Ambulatory Visit: Payer: Self-pay

## 2018-09-28 DIAGNOSIS — K259 Gastric ulcer, unspecified as acute or chronic, without hemorrhage or perforation: Secondary | ICD-10-CM | POA: Diagnosis not present

## 2018-09-28 DIAGNOSIS — K222 Esophageal obstruction: Secondary | ICD-10-CM | POA: Insufficient documentation

## 2018-09-28 DIAGNOSIS — K219 Gastro-esophageal reflux disease without esophagitis: Secondary | ICD-10-CM | POA: Diagnosis not present

## 2018-09-28 DIAGNOSIS — K449 Diaphragmatic hernia without obstruction or gangrene: Secondary | ICD-10-CM | POA: Diagnosis not present

## 2018-09-28 DIAGNOSIS — Z01812 Encounter for preprocedural laboratory examination: Secondary | ICD-10-CM | POA: Diagnosis not present

## 2018-09-28 DIAGNOSIS — R1310 Dysphagia, unspecified: Secondary | ICD-10-CM | POA: Diagnosis not present

## 2018-09-28 DIAGNOSIS — Z20828 Contact with and (suspected) exposure to other viral communicable diseases: Secondary | ICD-10-CM | POA: Diagnosis not present

## 2018-09-28 HISTORY — DX: Anemia, unspecified: D64.9

## 2018-09-28 HISTORY — DX: Depression, unspecified: F32.A

## 2018-09-28 LAB — SARS CORONAVIRUS 2 (TAT 6-24 HRS): SARS Coronavirus 2: NEGATIVE

## 2018-10-02 ENCOUNTER — Encounter (HOSPITAL_COMMUNITY): Payer: Self-pay

## 2018-10-02 ENCOUNTER — Encounter (HOSPITAL_COMMUNITY)
Admission: AD | Disposition: A | Discharge status: Home / Self Care | Payer: Self-pay | Source: Ambulatory Visit | Attending: Internal Medicine

## 2018-10-02 ENCOUNTER — Ambulatory Visit (HOSPITAL_COMMUNITY): Payer: Medicare HMO | Admitting: Anesthesiology

## 2018-10-02 ENCOUNTER — Observation Stay (HOSPITAL_COMMUNITY)
Admission: AD | Admit: 2018-10-02 | Discharge: 2018-10-03 | Disposition: A | Discharge status: Home / Self Care | Payer: Medicare HMO | Source: Ambulatory Visit | Attending: Internal Medicine | Admitting: Internal Medicine

## 2018-10-02 ENCOUNTER — Other Ambulatory Visit: Payer: Self-pay

## 2018-10-02 DIAGNOSIS — I700 Atherosclerosis of aorta: Secondary | ICD-10-CM | POA: Diagnosis not present

## 2018-10-02 DIAGNOSIS — K219 Gastro-esophageal reflux disease without esophagitis: Secondary | ICD-10-CM

## 2018-10-02 DIAGNOSIS — K2950 Unspecified chronic gastritis without bleeding: Secondary | ICD-10-CM | POA: Insufficient documentation

## 2018-10-02 DIAGNOSIS — R1319 Other dysphagia: Secondary | ICD-10-CM

## 2018-10-02 DIAGNOSIS — Z9049 Acquired absence of other specified parts of digestive tract: Secondary | ICD-10-CM | POA: Diagnosis not present

## 2018-10-02 DIAGNOSIS — Z7951 Long term (current) use of inhaled steroids: Secondary | ICD-10-CM | POA: Diagnosis not present

## 2018-10-02 DIAGNOSIS — E785 Hyperlipidemia, unspecified: Secondary | ICD-10-CM | POA: Insufficient documentation

## 2018-10-02 DIAGNOSIS — Z4889 Encounter for other specified surgical aftercare: Secondary | ICD-10-CM

## 2018-10-02 DIAGNOSIS — Z8249 Family history of ischemic heart disease and other diseases of the circulatory system: Secondary | ICD-10-CM | POA: Insufficient documentation

## 2018-10-02 DIAGNOSIS — K922 Gastrointestinal hemorrhage, unspecified: Secondary | ICD-10-CM

## 2018-10-02 DIAGNOSIS — R1310 Dysphagia, unspecified: Secondary | ICD-10-CM | POA: Diagnosis not present

## 2018-10-02 DIAGNOSIS — Z803 Family history of malignant neoplasm of breast: Secondary | ICD-10-CM | POA: Insufficient documentation

## 2018-10-02 DIAGNOSIS — K254 Chronic or unspecified gastric ulcer with hemorrhage: Secondary | ICD-10-CM | POA: Insufficient documentation

## 2018-10-02 DIAGNOSIS — Z8711 Personal history of peptic ulcer disease: Secondary | ICD-10-CM | POA: Insufficient documentation

## 2018-10-02 DIAGNOSIS — Z90710 Acquired absence of both cervix and uterus: Secondary | ICD-10-CM | POA: Diagnosis not present

## 2018-10-02 DIAGNOSIS — D649 Anemia, unspecified: Secondary | ICD-10-CM | POA: Insufficient documentation

## 2018-10-02 DIAGNOSIS — Z800 Family history of malignant neoplasm of digestive organs: Secondary | ICD-10-CM | POA: Diagnosis not present

## 2018-10-02 DIAGNOSIS — Z825 Family history of asthma and other chronic lower respiratory diseases: Secondary | ICD-10-CM | POA: Insufficient documentation

## 2018-10-02 DIAGNOSIS — Z79899 Other long term (current) drug therapy: Secondary | ICD-10-CM | POA: Insufficient documentation

## 2018-10-02 DIAGNOSIS — E042 Nontoxic multinodular goiter: Secondary | ICD-10-CM | POA: Insufficient documentation

## 2018-10-02 DIAGNOSIS — F329 Major depressive disorder, single episode, unspecified: Secondary | ICD-10-CM | POA: Insufficient documentation

## 2018-10-02 DIAGNOSIS — K449 Diaphragmatic hernia without obstruction or gangrene: Secondary | ICD-10-CM | POA: Insufficient documentation

## 2018-10-02 DIAGNOSIS — K222 Esophageal obstruction: Secondary | ICD-10-CM | POA: Diagnosis not present

## 2018-10-02 DIAGNOSIS — E7800 Pure hypercholesterolemia, unspecified: Secondary | ICD-10-CM | POA: Insufficient documentation

## 2018-10-02 DIAGNOSIS — K3189 Other diseases of stomach and duodenum: Secondary | ICD-10-CM | POA: Diagnosis not present

## 2018-10-02 DIAGNOSIS — R69 Illness, unspecified: Secondary | ICD-10-CM | POA: Diagnosis not present

## 2018-10-02 HISTORY — PX: BIOPSY: SHX5522

## 2018-10-02 HISTORY — PX: ESOPHAGOGASTRODUODENOSCOPY (EGD) WITH PROPOFOL: SHX5813

## 2018-10-02 HISTORY — DX: Dyspnea, unspecified: R06.00

## 2018-10-02 HISTORY — DX: Unspecified asthma, uncomplicated: J45.909

## 2018-10-02 HISTORY — PX: MALONEY DILATION: SHX5535

## 2018-10-02 LAB — BASIC METABOLIC PANEL
Anion gap: 8 (ref 5–15)
BUN: 7 mg/dL — ABNORMAL LOW (ref 8–23)
CO2: 25 mmol/L (ref 22–32)
Calcium: 8.9 mg/dL (ref 8.9–10.3)
Chloride: 105 mmol/L (ref 98–111)
Creatinine, Ser: 0.82 mg/dL (ref 0.44–1.00)
GFR calc Af Amer: >60 mL/min (ref >60)
GFR calc non Af Amer: >60 mL/min (ref >60)
Glucose, Bld: 92 mg/dL (ref 70–99)
Potassium: 3.7 mmol/L (ref 3.5–5.1)
Sodium: 138 mmol/L (ref 135–145)

## 2018-10-02 SURGERY — ESOPHAGOGASTRODUODENOSCOPY (EGD) WITH PROPOFOL
Anesthesia: General

## 2018-10-02 MED ORDER — ALBUTEROL SULFATE (2.5 MG/3ML) 0.083% IN NEBU: 3.0000 mL | INHALATION_SOLUTION | Freq: Four times a day (QID) | RESPIRATORY_TRACT | Status: DC | PRN

## 2018-10-02 MED ORDER — ENOXAPARIN SODIUM 40 MG/0.4ML ~~LOC~~ SOLN: 40.0000 mg | SUBCUTANEOUS | Status: DC

## 2018-10-02 MED ORDER — ONDANSETRON HCL 4 MG PO TABS: 4.0000 mg | ORAL_TABLET | Freq: Four times a day (QID) | ORAL | Status: DC | PRN

## 2018-10-02 MED ORDER — LACTATED RINGERS IV SOLN
INTRAVENOUS | Status: DC | PRN
  Administered 2018-10-02: 13:00:00 via INTRAVENOUS

## 2018-10-02 MED ORDER — ONDANSETRON HCL 4 MG/2ML IJ SOLN: 4.0000 mg | Freq: Four times a day (QID) | INTRAMUSCULAR | Status: DC | PRN

## 2018-10-02 MED ORDER — KETAMINE HCL 50 MG/5ML IJ SOSY
PREFILLED_SYRINGE | INTRAMUSCULAR | Status: AC
  Filled 2018-10-02: qty 5

## 2018-10-02 MED ORDER — CHLORHEXIDINE GLUCONATE CLOTH 2 % EX PADS: 6.0000 | MEDICATED_PAD | Freq: Once | CUTANEOUS | Status: DC

## 2018-10-02 MED ORDER — PANTOPRAZOLE SODIUM 40 MG PO TBEC
40.0000 mg | DELAYED_RELEASE_TABLET | Freq: Every day | ORAL | Status: DC
  Administered 2018-10-02: 21:00:00 40 mg via ORAL
  Filled 2018-10-02 (×2): qty 1

## 2018-10-02 MED ORDER — MIDAZOLAM HCL 2 MG/2ML IJ SOLN: 0.5000 mg | Freq: Once | INTRAMUSCULAR | Status: DC | PRN

## 2018-10-02 MED ORDER — POLYETHYLENE GLYCOL 3350 17 GM/SCOOP PO POWD: 1.0000 | Freq: Every day | ORAL | Status: DC | PRN

## 2018-10-02 MED ORDER — CITALOPRAM HYDROBROMIDE 20 MG PO TABS
20.0000 mg | ORAL_TABLET | Freq: Every day | ORAL | Status: DC
  Filled 2018-10-02: qty 1

## 2018-10-02 MED ORDER — ACETAMINOPHEN 650 MG RE SUPP: 650.0000 mg | Freq: Four times a day (QID) | RECTAL | Status: DC | PRN

## 2018-10-02 MED ORDER — PROMETHAZINE HCL 25 MG/ML IJ SOLN: 6.2500 mg | INTRAMUSCULAR | Status: DC | PRN

## 2018-10-02 MED ORDER — LIDOCAINE HCL (CARDIAC) PF 100 MG/5ML IV SOSY
PREFILLED_SYRINGE | INTRAVENOUS | Status: DC | PRN
  Administered 2018-10-02: 40 mg via INTRAVENOUS

## 2018-10-02 MED ORDER — PROPOFOL 10 MG/ML IV BOLUS
INTRAVENOUS | Status: DC | PRN
  Administered 2018-10-02: 20 mg via INTRAVENOUS

## 2018-10-02 MED ORDER — ACETAMINOPHEN 325 MG PO TABS: 650.0000 mg | ORAL_TABLET | Freq: Four times a day (QID) | ORAL | Status: DC | PRN

## 2018-10-02 MED ORDER — ZOLPIDEM TARTRATE 5 MG PO TABS: 5.0000 mg | ORAL_TABLET | Freq: Every evening | ORAL | Status: DC | PRN

## 2018-10-02 MED ORDER — HYDROMORPHONE HCL 1 MG/ML IJ SOLN: 0.2500 mg | INTRAMUSCULAR | Status: DC | PRN

## 2018-10-02 MED ORDER — LORATADINE 10 MG PO TABS
10.0000 mg | ORAL_TABLET | Freq: Every day | ORAL | Status: DC
  Filled 2018-10-02: qty 1

## 2018-10-02 MED ORDER — POLYETHYLENE GLYCOL 3350 17 G PO PACK: 17.0000 g | PACK | Freq: Every day | ORAL | Status: DC | PRN

## 2018-10-02 MED ORDER — PROPOFOL 500 MG/50ML IV EMUL
INTRAVENOUS | Status: DC | PRN
  Administered 2018-10-02: 150 ug/kg/min via INTRAVENOUS

## 2018-10-02 MED ORDER — KETAMINE HCL 10 MG/ML IJ SOLN
INTRAMUSCULAR | Status: DC | PRN
  Administered 2018-10-02: 10 mg via INTRAVENOUS

## 2018-10-02 MED ORDER — GLYCOPYRROLATE 0.2 MG/ML IJ SOLN
INTRAMUSCULAR | Status: DC | PRN
  Administered 2018-10-02: 0.2 mg via INTRAVENOUS

## 2018-10-02 MED ORDER — ATORVASTATIN CALCIUM 20 MG PO TABS
20.0000 mg | ORAL_TABLET | Freq: Every day | ORAL | Status: DC
  Filled 2018-10-02: qty 1

## 2018-10-02 MED ORDER — HYDROCODONE-ACETAMINOPHEN 7.5-325 MG PO TABS: 1.0000 | ORAL_TABLET | Freq: Once | ORAL | Status: DC | PRN

## 2018-10-02 MED ORDER — LACTATED RINGERS IV SOLN: INTRAVENOUS | Status: DC

## 2018-10-02 NOTE — Anesthesia Preprocedure Evaluation (Signed)
Anesthesia Evaluation  Patient identified by MRN, date of birth, ID band Patient awake  General Assessment Comment:States BP dropped after c/s  Reviewed: Allergy & Precautions, NPO status , Patient's Chart, lab work & pertinent test results  History of Anesthesia Complications (+) history of anesthetic complications  Airway Mallampati: II  TM Distance: >3 FB Neck ROM: Full    Dental no notable dental hx. (+) Edentulous Upper   Pulmonary asthma ,    Pulmonary exam normal breath sounds clear to auscultation       Cardiovascular Exercise Tolerance: Good negative cardio ROS Normal cardiovascular examI Rhythm:Regular Rate:Normal     Neuro/Psych Depression negative neurological ROS  negative psych ROS   GI/Hepatic negative GI ROS, Neg liver ROS, Had GERD , lost Weight , now off meds  Denies any GERD Sx today    Endo/Other  negative endocrine ROS  Renal/GU negative Renal ROS  negative genitourinary   Musculoskeletal negative musculoskeletal ROS (+)   Abdominal   Peds negative pediatric ROS (+)  Hematology negative hematology ROS (+)   Anesthesia Other Findings   Reproductive/Obstetrics negative OB ROS                             Anesthesia Physical Anesthesia Plan  ASA: II  Anesthesia Plan: General   Post-op Pain Management:    Induction: Intravenous  PONV Risk Score and Plan: 3 and TIVA, Propofol infusion, Treatment may vary due to age or medical condition and Ondansetron  Airway Management Planned: Nasal Cannula and Simple Face Mask  Additional Equipment:   Intra-op Plan:   Post-operative Plan:   Informed Consent: I have reviewed the patients History and Physical, chart, labs and discussed the procedure including the risks, benefits and alternatives for the proposed anesthesia with the patient or authorized representative who has indicated his/her understanding and  acceptance.     Dental advisory given  Plan Discussed with: CRNA  Anesthesia Plan Comments: (Plan Full PPE use  Plan GA with GETA as needed d/w pt -WTP with same after Q&A)        Anesthesia Quick Evaluation

## 2018-10-02 NOTE — H&P (Signed)
History and Physical  Amber Bird [id number] DOB: 02-Oct-1945 DOA: 10/02/2018   PCP: Amber Macadam, MD   Patient coming from: Home  Chief Complaint: dysphagia/odynophagia  HPI:  Amber Bird is a 73 y.o. female with medical history of depression, hyperlipidemia, and GERD presenting with dysphagia and odynophagia for at least the past 3 to 4 months.  Patient initially saw Dr. Gala Romney in the office on 07/28/2018 for intermittent odynophagia, solid food dysphagia, and GERD type symptoms.  She also complained of hiccups.  EGD was planned, but because of the COVID pandemic, the procedure was delayed.  Subsequently, the procedure was scheduled for 10/02/2018.  Unfortunately, the patient had a number of social issues surrounding scheduling the procedure and getting transportation home.  As result, the patient was admitted for observation to ensure her medical stability and ability to transport herself home via private vehicle the day after the procedure.  The patient herself denies any fevers, chills, chest pain, shortness breath, coughing, hemoptysis, nausea, vomiting, diarrhea, abdominal pain, hematochezia, melena.  She does feel that her abdomen is a little bloated.  She is passing gas.  On 10/02/2018, the patient underwent EGD which showed a mild Schatzki's ring at the GE junction.  There was no esophagitis or Barrett's esophagus changes.  There is multiple localized bleeding erosions in the gastric antrum. The patient was started on a PPI and admitted overnight for observation.  Assessment/Plan: Schatzki's ring/erosive gastropathy -Start PPI -10/02/2018 EGD--mild Schatzki's ring at the GE junction. Esophagus was dilated. There was no esophagitis or Barrett's esophagus changes.  There is multiple localized bleeding erosions in the gastric antrum. -advance diet  Hyperlipidemia -continue statin  Depression -continue celexa        Past Medical History:  Diagnosis Date  .  Anemia   . Complication of anesthesia    "drop in blood pressure years ago after hysterectomy "   . Depression   . History of bleeding ulcers   . Hypercholesteremia   . Multiple thyroid nodules   . Seasonal allergies    Past Surgical History:  Procedure Laterality Date  . ABDOMINAL HYSTERECTOMY     complete.   . CHOLECYSTECTOMY    . COLONOSCOPY N/A 12/18/2015   Rourk: diverticulosis in sigmoid colon, otherwise normal   . Cyst removed from ovary  1973  . dental implants    . LAPAROSCOPIC CHOLECYSTECTOMY SINGLE SITE WITH INTRAOPERATIVE CHOLANGIOGRAM N/A 05/12/2016   Procedure: LAPAROSCOPIC CHOLECYSTECTOMY SINGLE SITE WITH INTRAOPERATIVE CHOLANGIOGRAM;  Surgeon: Michael Boston, MD;  Location: [facility];  Service: General;  Laterality: N/A;  . left hammer toe repair    . thyroid needle biopsy    . VIDEO BRONCHOSCOPY Bilateral 03/21/2017   Did not have done   Social History:  reports that she has never smoked. She has never used smokeless tobacco. She reports that she does not drink alcohol or use drugs.   Family History  Problem Relation Age of Onset  . Colon cancer Other   . Breast cancer Mother   . Tuberculosis Mother   . Heart disease Father      No Known Allergies   Prior to Admission medications   Medication Sig Start Date End Date Taking? Authorizing Provider  atorvastatin (LIPITOR) 20 MG tablet Take 1 tablet (20 mg total) by mouth daily. 08/17/17  Yes Amber Macadam, MD  cetirizine (ZYRTEC) 10 MG tablet Take 1 tablet (10 mg total) by mouth daily. Patient taking differently: Take 10 mg by mouth  daily as needed for allergies.  03/03/17  Yes Hagler, Apolonio Schneiders, MD  citalopram (CELEXA) 40 MG tablet Take 40 mg by mouth daily.   Yes [provider]  naproxen sodium (ALEVE) 220 MG tablet Take 220 mg by mouth 2 (two) times daily as needed.   Yes [provider]  polyethylene glycol powder (GLYCOLAX/MIRALAX) powder 1 scoop daily or as needed Patient taking differently:  Take 1 Container by mouth daily as needed for mild constipation. 1 scoop daily or as neede 07/14/17  Yes Eure, Mertie Clause, MD  triamcinolone (KENALOG) 0.025 % cream Apply 1 application topically as needed (dry skin).   Yes [provider]  VENTOLIN HFA 108 (90 Base) MCG/ACT inhaler INHALE 2 PUFFS BY MOUTH EVERY 6 HOURS AS NEEDED FOR SHORTNESS OF BREATH Patient taking differently: Inhale 2 puffs into the lungs every 6 (six) hours as needed for wheezing or shortness of breath.  09/08/17  Yes Hagler, Apolonio Schneiders, MD  zolpidem (AMBIEN) 10 MG tablet Take 10 mg by mouth at bedtime.  08/28/18  Yes [provider]  citalopram (CELEXA) 20 MG tablet Take 1 tablet (20 mg total) by mouth daily. Patient not taking: Reported on 09/21/2018 08/17/17   Amber Macadam, MD    Review of Systems:  Constitutional:  No weight loss, night sweats, Fevers, chills, fatigue.  Head&Eyes: No headache.  No vision loss.  No eye pain or scotoma ENT:  No Difficulty swallowing,Tooth/dental problems,Sore throat,  No ear ache, post nasal drip,  Cardio-vascular:  No chest pain, Orthopnea, PND, swelling in lower extremities,  dizziness, palpitations  GI:  No  abdominal pain, nausea, vomiting, diarrhea, loss of appetite, hematochezia, melena, heartburn, indigestion, Resp:  No shortness of breath with exertion or at rest. No cough. No coughing up of blood .No wheezing.No chest wall deformity  Skin:  no rash or lesions.  GU:  no dysuria, change in color of urine, no urgency or frequency. No flank pain.  Musculoskeletal:  No joint pain or swelling. No decreased range of motion. No back pain.  Psych:  No change in mood or affect. No depression or anxiety. Neurologic: No headache, no dysesthesia, no focal weakness, no vision loss. No syncope  Physical Exam: Vitals:   10/02/18 1205 10/02/18 1430 10/02/18 1445 10/02/18 1500  BP: 112/65 (!) 86/74 91/69 100/64  Pulse: 76 79 74 81  Resp: 18 14 14 17   Temp: 98.1 F (36.7  C) 98.1 F (36.7 C)    TempSrc: Oral     SpO2: 97% 94% 93% 94%  Weight: 74.8 kg     Height: 5\' 4"  (1.626 m)      General:  A&O x 3, NAD, nontoxic, pleasant/cooperative Head/Eye: No conjunctival hemorrhage, no icterus, [place]/AT, No nystagmus ENT:  No icterus,  No thrush, good dentition, no pharyngeal exudate Neck:  No masses, no lymphadenpathy, no bruits CV:  RRR, no rub, no gallop, no S3 Lung:  CTAB, good air movement, no wheeze, no rhonchi Abdomen: soft/NT, +BS, nondistended, no peritoneal signs Ext: No cyanosis, No rashes, No petechiae, No lymphangitis, No edema Neuro: CNII-XII intact, strength 4/5 in bilateral upper and lower extremities, no dysmetria  Labs on Admission:  Basic Metabolic Panel: No results for input(s): NA, K, CL, CO2, GLUCOSE, BUN, CREATININE, CALCIUM, MG, PHOS in the last 168 hours. Liver Function Tests: No results for input(s): AST, ALT, ALKPHOS, BILITOT, PROT, ALBUMIN in the last 168 hours. No results for input(s): LIPASE, AMYLASE in the last 168 hours. No results for input(s):  AMMONIA in the last 168 hours. CBC: No results for input(s): WBC, NEUTROABS, HGB, HCT, MCV, PLT in the last 168 hours. Coagulation Profile: No results for input(s): INR, PROTIME in the last 168 hours. Cardiac Enzymes: No results for input(s): CKTOTAL, CKMB, CKMBINDEX, TROPONINI in the last 168 hours. BNP: Invalid input(s): POCBNP CBG: No results for input(s): GLUCAP in the last 168 hours. Urine analysis:    Component Value Date/Time   COLORURINE STRAW (A) 09/07/2018 0909   APPEARANCEUR CLEAR 09/07/2018 0909   LABSPEC 1.003 (L) 09/07/2018 0909   PHURINE 6.0 09/07/2018 0909   GLUCOSEU NEGATIVE 09/07/2018 0909   HGBUR NEGATIVE 09/07/2018 0909   BILIRUBINUR NEGATIVE 09/07/2018 0909   KETONESUR NEGATIVE 09/07/2018 0909   PROTEINUR NEGATIVE 09/07/2018 0909   NITRITE NEGATIVE 09/07/2018 0909   LEUKOCYTESUR NEGATIVE 09/07/2018 0909   Sepsis Labs:  @LABRCNTIP (procalcitonin:4,lacticidven:4) ) Recent Results (from the past 240 hour(s))  SARS CORONAVIRUS 2 (Jeric Slagel 6-12 HRS) Nasal Swab Aptima Multi Swab     Status: None   Collection Time: 09/28/18  7:04 AM   Specimen: Aptima Multi Swab; Nasal Swab  Result Value Ref Range Status   SARS Coronavirus 2 NEGATIVE NEGATIVE Final    Comment: (NOTE) SARS-CoV-2 target nucleic acids are NOT DETECTED. The SARS-CoV-2 RNA is generally detectable in upper and lower respiratory specimens during the acute phase of infection. Negative results do not preclude SARS-CoV-2 infection, do not rule out co-infections with other pathogens, and should not be used as the sole basis for treatment or other patient management decisions. Negative results must be combined with clinical observations, patient history, and epidemiological information. The expected result is Negative. Fact Sheet for Patients: SugarRoll.be Fact Sheet for Healthcare Providers: https://www.woods-mathews.com/ This test is not yet approved or cleared by the [place] FDA and  has been authorized for detection and/or diagnosis of SARS-CoV-2 by FDA under an Emergency Use Authorization (EUA). This EUA will remain  in effect (meaning this test can be used) for the duration of the COVID-19 declaration under Section 56 4(b)(1) of the Act, 21 U.S.C. section 360bbb-3(b)(1), unless the authorization is terminated or revoked sooner. Performed at [facility], [place] [address] [place], [place] [address]      Radiological Exams on Admission: No results found.      Time spent:60 minutes Code Status:   FULL Family Communication:  No Family at bedside Disposition Plan: discharge home 9/1 if stable Consults called: none  DVT Prophylaxis: [place] Lovenox  Orson Eva, DO  [facility] Pager [phone]  If 7PM-7AM, please contact night-coverage [email or web address] Password [facility] 10/02/2018, 3:58  PM

## 2018-10-02 NOTE — H&P (Signed)
@LOGO @   Primary Care Physician:  [name], MD Primary Gastroenterologist:  Dr. Gala Romney  Pre-Procedure History & Physical: HPI:  Amber Bird is a 73 y.o. female here for further evaluation of odynophagia dysphagia and reflux via EGD.  Work-up delayed because of the pandemic.  Because of patient's unique social situation, the hospitalist service has kindly observed agreed to observe this lady for 24 hours post procedure.  When her time is up, she will be able to be discharged and get her vehicle from the parking lot and drive herself home.  Past Medical History:  Diagnosis Date  . Anemia   . Complication of anesthesia    "drop in blood pressure years ago after hysterectomy "   . Depression   . History of bleeding ulcers   . Hypercholesteremia   . Multiple thyroid nodules   . Seasonal allergies     Past Surgical History:  Procedure Laterality Date  . ABDOMINAL HYSTERECTOMY     complete.   . CHOLECYSTECTOMY    . COLONOSCOPY N/A 12/18/2015   Rourk: diverticulosis in sigmoid colon, otherwise normal   . Cyst removed from ovary  1973  . dental implants    . LAPAROSCOPIC CHOLECYSTECTOMY SINGLE SITE WITH INTRAOPERATIVE CHOLANGIOGRAM N/A 05/12/2016   Procedure: LAPAROSCOPIC CHOLECYSTECTOMY SINGLE SITE WITH INTRAOPERATIVE CHOLANGIOGRAM;  Surgeon: Michael Boston, MD;  Location: [facility];  Service: General;  Laterality: N/A;  . left hammer toe repair    . thyroid needle biopsy    . VIDEO BRONCHOSCOPY Bilateral 03/21/2017   Did not have done    Prior to Admission medications   Medication Sig Start Date End Date Taking? Authorizing Provider  atorvastatin (LIPITOR) 20 MG tablet Take 1 tablet (20 mg total) by mouth daily. 08/17/17  Yes [name], MD  cetirizine (ZYRTEC) 10 MG tablet Take 1 tablet (10 mg total) by mouth daily. Patient taking differently: Take 10 mg by mouth daily as needed for allergies.  03/03/17  Yes Hagler, Apolonio Schneiders, MD  citalopram (CELEXA) 40 MG tablet Take 40 mg by  mouth daily.   Yes [provider]  naproxen sodium (ALEVE) 220 MG tablet Take 220 mg by mouth 2 (two) times daily as needed.   Yes [provider]  polyethylene glycol powder (GLYCOLAX/MIRALAX) powder 1 scoop daily or as needed Patient taking differently: Take 1 Container by mouth daily as needed for mild constipation. 1 scoop daily or as neede 07/14/17  Yes Eure, Mertie Clause, MD  triamcinolone (KENALOG) 0.025 % cream Apply 1 application topically as needed (dry skin).   Yes [provider]  VENTOLIN HFA 108 (90 Base) MCG/ACT inhaler INHALE 2 PUFFS BY MOUTH EVERY 6 HOURS AS NEEDED FOR SHORTNESS OF BREATH Patient taking differently: Inhale 2 puffs into the lungs every 6 (six) hours as needed for wheezing or shortness of breath.  09/08/17  Yes Hagler, Apolonio Schneiders, MD  zolpidem (AMBIEN) 10 MG tablet Take 10 mg by mouth at bedtime.  08/28/18  Yes [provider]  citalopram (CELEXA) 20 MG tablet Take 1 tablet (20 mg total) by mouth daily. Patient not taking: Reported on 09/21/2018 08/17/17   [name], MD    Allergies as of 07/31/2018  . (No Known Allergies)    Family History  Problem Relation Age of Onset  . Colon cancer Other   . Breast cancer Mother   . Tuberculosis Mother   . Heart disease Father     Social History   Socioeconomic History  . Marital status:  Divorced    Spouse name: Not on file  . Number of children: Not on file  . Years of education: Not on file  . Highest education level: Not on file  Occupational History  . Not on file  Social Needs  . Financial resource strain: Not on file  . Food insecurity    Worry: Not on file    Inability: Not on file  . Transportation needs    Medical: Not on file    Non-medical: Not on file  Tobacco Use  . Smoking status: Never Smoker  . Smokeless tobacco: Never Used  Substance and Sexual Activity  . Alcohol use: No  . Drug use: No  . Sexual activity: Not Currently    Birth control/protection: None   Lifestyle  . Physical activity    Days per week: Not on file    Minutes per session: Not on file  . Stress: Not on file  Relationships  . Social Herbalist on phone: Not on file    Gets together: Not on file    Attends religious service: Not on file    Active member of club or organization: Not on file    Attends meetings of clubs or organizations: Not on file    Relationship status: Not on file  . Intimate partner violence    Fear of current or ex partner: Not on file    Emotionally abused: Not on file    Physically abused: Not on file    Forced sexual activity: Not on file  Other Topics Concern  . Not on file  Social History Narrative   Moved from [facility] 2009. In 2008 when the economy dropped lost job. Reports that after lost job was hard to get another job.  Is not married and does not have children. Used to enjoy horses. Reports that moved here b/c it was a lot cheaper and warmer. Had visited here for a horse event in 1990 so decided to move here. Lives in [place], [place]. Reports that made stupid financial decisions and is dealing with that at this time.     Review of Systems: See HPI, otherwise negative ROS  Physical Exam: BP 112/65   Pulse 76   Temp 98.1 F (36.7 C) (Oral)   Resp 18   Ht 5\' 4"  (1.626 m)   Wt 74.8 kg   SpO2 97%   BMI 28.32 kg/m  General:   Alert,  Well-developed, well-nourished, pleasant and cooperative in NAD Neck:  Supple; no masses or thyromegaly. No significant cervical adenopathy. Lungs:  Clear throughout to auscultation.   No wheezes, crackles, or rhonchi. No acute distress. Heart:  Regular rate and rhythm; no murmurs, clicks, rubs,  or gallops. Abdomen: Non-distended, normal bowel sounds.  Soft and nontender without appreciable mass or hepatosplenomegaly.  Pulses:  Normal pulses noted. Extremities:  Without clubbing or edema.  Impression/Plan: 72 year old lady with odynophagia/dysphagia/GERD symptoms.  Here for EGD possible  esophageal dilation as feasible/appropriate.  The risks, benefits, limitations, alternatives and imponderables have been reviewed with the patient. Potential for esophageal dilation, biopsy, etc. have also been reviewed.  Questions have been answered. All parties agreeable.     Notice: This dictation was prepared with Dragon dictation along with smaller phrase technology. Any transcriptional errors that result from this process are unintentional and may not be corrected upon review.

## 2018-10-02 NOTE — Anesthesia Procedure Notes (Signed)
Procedure Name: General with mask airway Date/Time: 10/02/2018 2:07 PM Performed by: Andree Elk Amy A, CRNA Pre-anesthesia Checklist: Patient identified, Emergency Drugs available, Suction available, Patient being monitored and Timeout performed Oxygen Delivery Method: Non-rebreather mask

## 2018-10-02 NOTE — Discharge Instructions (Signed)
EGD Discharge instructions Please read the instructions outlined below and refer to this sheet in the next few weeks. These discharge instructions provide you with general information on caring for yourself after you leave the hospital. Your doctor may also give you specific instructions. While your treatment has been planned according to the most current medical practices available, unavoidable complications occasionally occur. If you have any problems or questions after discharge, please call your doctor. ACTIVITY  You may resume your regular activity but move at a slower pace for the next 24 hours.   Take frequent rest periods for the next 24 hours.   Walking will help expel (get rid of) the air and reduce the bloated feeling in your abdomen.   No driving for 24 hours (because of the anesthesia (medicine) used during the test).   You may shower.   Do not sign any important legal documents or operate any machinery for 24 hours (because of the anesthesia used during the test).  NUTRITION  Drink plenty of fluids.   You may resume your normal diet.   Begin with a light meal and progress to your normal diet.   Avoid alcoholic beverages for 24 hours or as instructed by your caregiver.  MEDICATIONS  You may resume your normal medications unless your caregiver tells you otherwise.  WHAT YOU CAN EXPECT TODAY  You may experience abdominal discomfort such as a feeling of fullness or gas pains.  FOLLOW-UP  Your doctor will discuss the results of your test with you.  SEEK IMMEDIATE MEDICAL ATTENTION IF ANY OF THE FOLLOWING OCCUR:  Excessive nausea (feeling sick to your stomach) and/or vomiting.   Severe abdominal pain and distention (swelling).   Trouble swallowing.   Temperature over 101 F (37.8 C).   Rectal bleeding or vomiting of blood.    GERD information provided; your esophagus was dilated today.  You had inflammation in your stomach.  Biopsies taken.  Further  recommendations to follow pending review of pathology report  Begin Protonix 40 mg daily  Office visit with [place] in 3 months  You can leave the hospital in 24 hours and drive yourself home.

## 2018-10-02 NOTE — Op Note (Addendum)
[facility] Patient Name: Amber Bird Procedure Date: 10/02/2018 1:41 PM MRN: [id number] Date of Birth: 01/16/1946 Attending MD: Norvel Richards , MD CSN: [id number] Age: 73 Admit Type: Outpatient Procedure:                Upper GI endoscopy Indications:              Dysphagia Providers:                Norvel Richards, MD, Jeanann Lewandowsky. Sharon Seller, RN,                            Randa Spike, Technician Referring MD:              Medicines:                Propofol per Anesthesia Complications:            No immediate complications. Estimated Blood Loss:     Estimated blood loss was minimal. Estimated blood                            loss was minimal. Procedure:                Pre-Anesthesia Assessment:                           - Prior to the procedure, a History and Physical                            was performed, and patient medications and                            allergies were reviewed. The patient's tolerance of                            previous anesthesia was also reviewed. The risks                            and benefits of the procedure and the sedation                            options and risks were discussed with the patient.                            All questions were answered, and informed consent                            was obtained. Prior Anticoagulants: The patient has                            taken no previous anticoagulant or antiplatelet                            agents. ASA Grade Assessment: II - A patient with  mild systemic disease. After reviewing the risks                            and benefits, the patient was deemed in                            satisfactory condition to undergo the procedure.                           After obtaining informed consent, the endoscope was                            passed under direct vision. Throughout the                            procedure, the patient's blood  pressure, pulse, and                            oxygen saturations were monitored continuously. The                            [id number] scope was introduced through the                            mouth, and advanced to the second part of duodenum.                            The upper GI endoscopy was accomplished without                            difficulty. The upper GI endoscopy was accomplished                            without difficulty. The patient tolerated the                            procedure well. Scope In: 2:14:52 PM Scope Out: 2:22:19 PM Total Procedure Duration: 0 hours 7 minutes 27 seconds  Findings:      A mild Schatzki ring was found at the gastroesophageal junction. No       esophagitis seen. No Barrett's epithelium or tumor seen.      A small hiatal hernia was present.      Multiple localized, bleeding erosions were found in the gastric antrum.       No ulcer or infiltrating process.      The duodenal bulb and second portion of the duodenum were normal.       Biopsies were taken with a cold forceps for histology. The scope was       withdrawn. Dilation was performed with a Maloney dilator with no       resistance at 46 Fr. The scope was withdrawn. Dilation was performed       with a Maloney dilator with mild resistance at 56 Fr. The dilation site       was examined following endoscope reinsertion and showed moderate mucosal  disruption. Estimated blood loss was minimal. Finally, the abnormal was       biopsied with a cold forceps for histology. Estimated blood loss was       minimal. Impression:               - Mild Schatzki ring.                           - Normal gastroesophageal junction.                           - Small hiatal hernia.                           - Bleeding erosive gastropathy. Biopsied.                           - Normal duodenal bulb and second portion of the                            duodenum. Moderate Sedation:       Moderate (conscious) sedation was personally administered by an       anesthesia professional. The following parameters were monitored: oxygen       saturation, heart rate, blood pressure, and response to care. Recommendation:           - Continue present medications. Begin Protonix 40                            mg daily. Because of patient's unique social                            situation, patient will be observed here for 24                            hours before she is allowed to leave the hospital                            and drive herself home. This algorithm was                            discussed with Dr. Roderic Palau last week who has kindly                            agreed to assist [place] with this scenario.                           - Advance diet as tolerated.                           - Await pathology results. Procedure Code(s):        --- Professional ---                           [phone], Esophagogastroduodenoscopy, flexible,  transoral; with biopsy, single or multiple                           43450, Dilation of esophagus, by unguided sound or                            bougie, single or multiple passes Diagnosis Code(s):        --- Professional ---                           K22.2, Esophageal obstruction                           K44.9, Diaphragmatic hernia without obstruction or                            gangrene                           K31.89, Other diseases of stomach and duodenum                           K92.2, Gastrointestinal hemorrhage, unspecified                           R13.10, Dysphagia, unspecified CPT copyright 2019 American Medical Association. All rights reserved. The codes documented in this report are preliminary and upon coder review may  be revised to meet current compliance requirements. Cristopher Estimable. Stashia Sia, MD Norvel Richards, MD 10/02/2018 2:44:06 PM This report has been signed electronically. Number of Addenda:  1 Addendum Number: 1   Addendum Date: 10/05/2018 3:26:19 PM      text should read non-bleeding; not bleeding erosions Cristopher Estimable. Rusti Arizmendi, MD Norvel Richards, MD 10/05/2018 3:27:04 PM This report has been signed electronically.

## 2018-10-02 NOTE — Anesthesia Postprocedure Evaluation (Signed)
Anesthesia Post Note  Patient: Amber Bird  Procedure(s) Performed: ESOPHAGOGASTRODUODENOSCOPY (EGD) WITH PROPOFOL (N/A ) MALONEY DILATION (N/A ) BIOPSY  Patient location during evaluation: PACU Anesthesia Type: General Level of consciousness: awake and alert and patient cooperative Pain management: pain level controlled Vital Signs Assessment: post-procedure vital signs reviewed and stable Respiratory status: spontaneous breathing Cardiovascular status: stable Postop Assessment: no apparent nausea or vomiting Anesthetic complications: no     Last Vitals:  Vitals:   10/02/18 1205 10/02/18 1430  BP: 112/65 (!) 86/74  Pulse: 76 79  Resp: 18 14  Temp: 36.7 C 36.7 C  SpO2: 97% 94%    Last Pain:  Vitals:   10/02/18 1430  TempSrc:   PainSc: 0-No pain                 ADAMS, AMY A

## 2018-10-02 NOTE — Transfer of Care (Signed)
Immediate Anesthesia Transfer of Care Note  Patient: Amber Bird  Procedure(s) Performed: ESOPHAGOGASTRODUODENOSCOPY (EGD) WITH PROPOFOL (N/A ) MALONEY DILATION (N/A ) BIOPSY  Patient Location: PACU  Anesthesia Type:General  Level of Consciousness: awake, alert , oriented and patient cooperative  Airway & Oxygen Therapy: Patient Spontanous Breathing  Post-op Assessment: Report given to RN and Post -op Vital signs reviewed and stable  Post vital signs: Reviewed and stable  Last Vitals:  Vitals Value Taken Time  BP 86/74 10/02/18 1430  Temp 36.7 C 10/02/18 1430  Pulse 72 10/02/18 1439  Resp 16 10/02/18 1439  SpO2 95 % 10/02/18 1439  Vitals shown include unvalidated device data.  Last Pain:  Vitals:   10/02/18 1430  TempSrc:   PainSc: 0-No pain      Patients Stated Pain Goal: 6 ([id number])  Complications: No apparent anesthesia complications

## 2018-10-03 DIAGNOSIS — R1310 Dysphagia, unspecified: Secondary | ICD-10-CM | POA: Diagnosis not present

## 2018-10-03 DIAGNOSIS — K219 Gastro-esophageal reflux disease without esophagitis: Secondary | ICD-10-CM | POA: Diagnosis not present

## 2018-10-03 DIAGNOSIS — Z4889 Encounter for other specified surgical aftercare: Secondary | ICD-10-CM | POA: Diagnosis not present

## 2018-10-03 MED ORDER — PANTOPRAZOLE SODIUM 40 MG PO TBEC: 40.0000 mg | DELAYED_RELEASE_TABLET | Freq: Every day | ORAL | 1 refills | Status: DC

## 2018-10-03 NOTE — Discharge Summary (Signed)
Physician Discharge Summary  Amber Bird [id number] DOB: May 02, 1945 DOA: 10/02/2018  PCP: [name], MD  Admit date: 10/02/2018 Discharge date: 10/03/2018  Admitted From: Home Disposition:  Home  Recommendations for Outpatient Follow-up:  1. Follow up with PCP in 1-2 weeks 2. Please obtain BMP/CBC in one week     Discharge Condition: Stable CODE STATUS: DNR Diet recommendation: Heart Healthy/Soft   Brief/Interim Summary: 73 y.o. female with medical history of depression, hyperlipidemia, and GERD presenting with dysphagia and odynophagia for at least the past 3 to 4 months.  Patient initially saw Dr. Gala Romney in the office on 07/28/2018 for intermittent odynophagia, solid food dysphagia, and GERD type symptoms.  She also complained of hiccups.  EGD was planned, but because of the COVID pandemic, the procedure was delayed.  Subsequently, the procedure was scheduled for 10/02/2018.  Unfortunately, the patient had a number of social issues surrounding scheduling the procedure and getting transportation home.  As result, the patient was admitted for observation to ensure her medical stability and ability to transport herself home via private vehicle the day after the procedure.  The patient herself denies any fevers, chills, chest pain, shortness breath, coughing, hemoptysis, nausea, vomiting, diarrhea, abdominal pain, hematochezia, melena.  She does feel that her abdomen is a little bloated.  She is passing gas.  On 10/02/2018, the patient underwent EGD which showed a mild Schatzki's ring at the GE junction.  There was no esophagitis or Barrett's esophagus changes.  There is multiple localized bleeding erosions in the gastric antrum.  Her diet was advanced after EGD and she tolerated it without worsening pain, odynophagia or vomiting.  Discharge Diagnoses:  Schatzki's ring/erosive gastropathy -Continue PPI -10/02/2018 EGD--mild Schatzki's ring at the GE junction. Esophagus was dilated.  There was no esophagitis or Barrett's esophagus changes.  There is multiple localized bleeding erosions in the gastric antrum. -advance diet  Hyperlipidemia -continue statin  Depression -continue celexa   Discharge Instructions   Allergies as of 10/03/2018   No Known Allergies     Medication List    STOP taking these medications   naproxen sodium 220 MG tablet Commonly known as: ALEVE   polyethylene glycol powder 17 GM/SCOOP powder Commonly known as: GLYCOLAX/MIRALAX     TAKE these medications   atorvastatin 20 MG tablet Commonly known as: LIPITOR Take 1 tablet (20 mg total) by mouth daily.   cetirizine 10 MG tablet Commonly known as: ZYRTEC Take 1 tablet (10 mg total) by mouth daily. What changed:   when to take this  reasons to take this   citalopram 40 MG tablet Commonly known as: CELEXA Take 40 mg by mouth daily. What changed: Another medication with the same name was removed. Continue taking this medication, and follow the directions you see here.   pantoprazole 40 MG tablet Commonly known as: PROTONIX Take 1 tablet (40 mg total) by mouth daily.   triamcinolone 0.025 % cream Commonly known as: KENALOG Apply 1 application topically as needed (dry skin).   Ventolin HFA 108 (90 Base) MCG/ACT inhaler Generic drug: albuterol INHALE 2 PUFFS BY MOUTH EVERY 6 HOURS AS NEEDED FOR SHORTNESS OF BREATH What changed: See the new instructions.   zolpidem 10 MG tablet Commonly known as: AMBIEN Take 10 mg by mouth at bedtime.       No Known Allergies  Consultations:  none   Procedures/Studies: Ct Abdomen Pelvis W Contrast  Result Date: 09/07/2018 CLINICAL DATA:  Abdominal pain, acute, lower abdomen normal in for 6  weeks EXAM: CT ABDOMEN AND PELVIS WITH CONTRAST TECHNIQUE: Multidetector CT imaging of the abdomen and pelvis was performed using the standard protocol following bolus administration of intravenous contrast. CONTRAST:  183mL OMNIPAQUE IOHEXOL  300 MG/ML  SOLN COMPARISON:  CT August 31, 2018. FINDINGS: Lower chest: The visualized heart size within normal limits. No pericardial fluid/thickening. No hiatal hernia. A streaky atelectasis or scarring seen at the right lung base. Tiny calcified lymph node seen posterior to the esophagus and at the left hilum. Hepatobiliary: The liver is normal in density without focal abnormality.The main portal vein is patent. The patient is status post cholecystectomy. No biliary ductal dilation. A Pancreas: Unremarkable. No pancreatic ductal dilatation or surrounding inflammatory changes. Spleen: Normal in size without focal abnormality. Adrenals/Urinary Tract: Both adrenal glands appear normal. 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 No renal calculi or hydronephrosis is seen. No perinephric stranding. Stomach/Bowel: The stomach, small bowel are normal in appearance. There is a duodenal diverticulum. Again noted is a moderate to large amount of colonic stool especially within the right colon. There is air and stool to the level the rectum. No inflammatory changes, wall thickening, or obstructive findings.The appendix is normal. Vascular/Lymphatic: There are no enlarged mesenteric, retroperitoneal, or pelvic lymph nodes. Scattered aortic atherosclerotic calcifications are seen without aneurysmal dilatation. Reproductive: the patient is status post hysterectomy. No adnexal masses or collections seen. Other: No evidence of abdominal wall mass or hernia. Musculoskeletal: No acute or significant osseous findings. IMPRESSION: Moderate to large amount of colonic stool, especially within the right colon as seen on the prior exam. No definite evidence of obstruction. Electronically Signed   By: Prudencio Pair M.D.   On: 09/07/2018 12:03         Discharge Exam: Vitals:   10/02/18 2106 10/03/18 0643  BP: 105/65 102/63  Pulse: 67 63  Resp: 16 16  Temp: 98.1 F (36.7 C) 98.1 F (36.7 C)  SpO2: 95% 96%   Vitals:    10/02/18 1738 10/02/18 1800 10/02/18 2106 10/03/18 0643  BP: 115/69  105/65 102/63  Pulse: 77  67 63  Resp:  14 16 16   Temp: 98.5 F (36.9 C) 98.5 F (36.9 C) 98.1 F (36.7 C) 98.1 F (36.7 C)  TempSrc: Oral Oral Oral Oral  SpO2: 98%  95% 96%  Weight:      Height:        General: Pt is alert, awake, not in acute distress Cardiovascular: RRR, S1/S2 +, no rubs, no gallops Respiratory: CTA bilaterally, no wheezing, no rhonchi Abdominal: Soft, NT, ND, bowel sounds + Extremities: no edema, no cyanosis   The results of significant diagnostics from this hospitalization (including imaging, microbiology, ancillary and laboratory) are listed below for reference.    Significant Diagnostic Studies: Ct Abdomen Pelvis W Contrast  Result Date: 09/07/2018 CLINICAL DATA:  Abdominal pain, acute, lower abdomen normal in for 6 weeks EXAM: CT ABDOMEN AND PELVIS WITH CONTRAST TECHNIQUE: Multidetector CT imaging of the abdomen and pelvis was performed using the standard protocol following bolus administration of intravenous contrast. CONTRAST:  155mL OMNIPAQUE IOHEXOL 300 MG/ML  SOLN COMPARISON:  CT August 31, 2018. FINDINGS: Lower chest: The visualized heart size within normal limits. No pericardial fluid/thickening. No hiatal hernia. A streaky atelectasis or scarring seen at the right lung base. Tiny calcified lymph node seen posterior to the esophagus and at the left hilum. Hepatobiliary: The liver is normal in density without focal abnormality.The main portal vein is patent. The patient is status  post cholecystectomy. No biliary ductal dilation. A Pancreas: Unremarkable. No pancreatic ductal dilatation or surrounding inflammatory changes. Spleen: Normal in size without focal abnormality. Adrenals/Urinary Tract: Both adrenal glands appear normal. 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 No renal calculi or hydronephrosis is seen. No perinephric stranding. Stomach/Bowel: The stomach, small bowel are  normal in appearance. There is a duodenal diverticulum. Again noted is a moderate to large amount of colonic stool especially within the right colon. There is air and stool to the level the rectum. No inflammatory changes, wall thickening, or obstructive findings.The appendix is normal. Vascular/Lymphatic: There are no enlarged mesenteric, retroperitoneal, or pelvic lymph nodes. Scattered aortic atherosclerotic calcifications are seen without aneurysmal dilatation. Reproductive: the patient is status post hysterectomy. No adnexal masses or collections seen. Other: No evidence of abdominal wall mass or hernia. Musculoskeletal: No acute or significant osseous findings. IMPRESSION: Moderate to large amount of colonic stool, especially within the right colon as seen on the prior exam. No definite evidence of obstruction. Electronically Signed   By: Prudencio Pair M.D.   On: 09/07/2018 12:03     Microbiology: Recent Results (from the past 240 hour(s))  SARS CORONAVIRUS 2 (Nidhi Jacome 6-12 HRS) Nasal Swab Aptima Multi Swab     Status: None   Collection Time: 09/28/18  7:04 AM   Specimen: Aptima Multi Swab; Nasal Swab  Result Value Ref Range Status   SARS Coronavirus 2 NEGATIVE NEGATIVE Final    Comment: (NOTE) SARS-CoV-2 target nucleic acids are NOT DETECTED. The SARS-CoV-2 RNA is generally detectable in upper and lower respiratory specimens during the acute phase of infection. Negative results do not preclude SARS-CoV-2 infection, do not rule out co-infections with other pathogens, and should not be used as the sole basis for treatment or other patient management decisions. Negative results must be combined with clinical observations, patient history, and epidemiological information. The expected result is Negative. Fact Sheet for Patients: SugarRoll.be Fact Sheet for Healthcare Providers: https://www.woods-mathews.com/ This test is not yet approved or cleared by the  [place] FDA and  has been authorized for detection and/or diagnosis of SARS-CoV-2 by FDA under an Emergency Use Authorization (EUA). This EUA will remain  in effect (meaning this test can be used) for the duration of the COVID-19 declaration under Section 56 4(b)(1) of the Act, 21 U.S.C. section 360bbb-3(b)(1), unless the authorization is terminated or revoked sooner. Performed at [facility], [place] [address], [place], [place] [address]      Labs: Basic Metabolic Panel: Recent Labs  Lab 10/02/18 1820  NA 138  K 3.7  CL 105  CO2 25  GLUCOSE 92  BUN 7*  CREATININE 0.82  CALCIUM 8.9   Liver Function Tests: No results for input(s): AST, ALT, ALKPHOS, BILITOT, PROT, ALBUMIN in the last 168 hours. No results for input(s): LIPASE, AMYLASE in the last 168 hours. No results for input(s): AMMONIA in the last 168 hours. CBC: No results for input(s): WBC, NEUTROABS, HGB, HCT, MCV, PLT in the last 168 hours. Cardiac Enzymes: No results for input(s): CKTOTAL, CKMB, CKMBINDEX, TROPONINI in the last 168 hours. BNP: Invalid input(s): POCBNP CBG: No results for input(s): GLUCAP in the last 168 hours.  Time coordinating discharge:  36 minutes  Signed:  Orson Eva, DO [facility] Pager: [phone] 10/03/2018, 9:52 AM

## 2018-10-03 NOTE — Care Management Obs Status (Signed)
[facility] NOTIFICATION   Patient Details  Name: Amber Bird MRN: [id number] Date of Birth: 23-Jul-1945   Medicare Observation Status Notification Given:  Yes    Boneta Lucks, RN 10/03/2018, 9:06 AM

## 2018-10-03 NOTE — Progress Notes (Signed)
Nsg Discharge Note  Admit Date:  10/02/2018 Discharge date: 10/03/2018   Amber Bird to be D/C'd Home per MD order.  AVS completed.  Patient able to verbalize understanding.  Discharge Medication: Allergies as of 10/03/2018   No Known Allergies     Medication List    STOP taking these medications   naproxen sodium 220 MG tablet Commonly known as: ALEVE   polyethylene glycol powder 17 GM/SCOOP powder Commonly known as: GLYCOLAX/MIRALAX     TAKE these medications   atorvastatin 20 MG tablet Commonly known as: LIPITOR Take 1 tablet (20 mg total) by mouth daily.   cetirizine 10 MG tablet Commonly known as: ZYRTEC Take 1 tablet (10 mg total) by mouth daily. What changed:   when to take this  reasons to take this   citalopram 40 MG tablet Commonly known as: CELEXA Take 40 mg by mouth daily. What changed: Another medication with the same name was removed. Continue taking this medication, and follow the directions you see here.   pantoprazole 40 MG tablet Commonly known as: PROTONIX Take 1 tablet (40 mg total) by mouth daily.   triamcinolone 0.025 % cream Commonly known as: KENALOG Apply 1 application topically as needed (dry skin).   Ventolin HFA 108 (90 Base) MCG/ACT inhaler Generic drug: albuterol INHALE 2 PUFFS BY MOUTH EVERY 6 HOURS AS NEEDED FOR SHORTNESS OF BREATH What changed: See the new instructions.   zolpidem 10 MG tablet Commonly known as: AMBIEN Take 10 mg by mouth at bedtime.       Discharge Assessment: Vitals:   10/02/18 2106 10/03/18 0643  BP: 105/65 102/63  Pulse: 67 63  Resp: 16 16  Temp: 98.1 F (36.7 C) 98.1 F (36.7 C)  SpO2: 95% 96%   Skin clean, dry and intact without evidence of skin break down, no evidence of skin tears noted. IV catheter discontinued intact. Site without signs and symptoms of complications - no redness or edema noted at insertion site, patient denies c/o pain - only slight tenderness at site.  Dressing with  slight pressure applied.  D/c Instructions-Education: Discharge instructions given to patient with verbalized understanding. D/c education completed with patient including follow up instructions, medication list, d/c activities limitations if indicated, with other d/c instructions as indicated by MD - patient able to verbalize understanding, all questions fully answered. Patient instructed to return to ED, call 911, or call MD for any changes in condition.  Patient escorted via [place], and D/C home via private auto.  Duncan Alejandro C, RN 10/03/2018 10:13 AM

## 2018-10-05 ENCOUNTER — Encounter: Payer: Self-pay | Admitting: Internal Medicine

## 2018-10-05 ENCOUNTER — Encounter (HOSPITAL_COMMUNITY): Payer: Self-pay | Admitting: Internal Medicine

## 2018-10-06 ENCOUNTER — Telehealth: Payer: Self-pay

## 2018-10-06 NOTE — Telephone Encounter (Signed)
Per RMR- Send letter to patient.  Send copy of letter with path to referring provider and PCP. Patient needs a follow-up appointment about 3 months if not already scheduled with extender

## 2018-10-19 ENCOUNTER — Telehealth: Payer: Self-pay | Admitting: *Deleted

## 2018-10-19 NOTE — Telephone Encounter (Signed)
Pt had procedure on 10/02/2018.  Pt still with stomach pain.  Can we call her?  [phone]

## 2018-10-19 NOTE — Telephone Encounter (Signed)
Spoke with pt. We went over the [employer] over the phone. Pt is going to go without the tomatoes she was eating and take the Pantoprazole 40 mg before breakfast instead of at night. Pt will call back if needed.

## 2018-10-22 ENCOUNTER — Encounter: Payer: Self-pay | Admitting: Gastroenterology

## 2018-10-22 NOTE — Telephone Encounter (Signed)
OV made °

## 2018-10-23 ENCOUNTER — Telehealth: Payer: Self-pay | Admitting: Internal Medicine

## 2018-10-23 NOTE — Telephone Encounter (Signed)
Pt called asking for AM. She said her bowels aren't moving and having abd pain and wanted to speak with the nurse. Please call [phone]

## 2018-10-23 NOTE — Telephone Encounter (Signed)
Spoke with pt.1. She would like to know what caused her inflammation in her stomach. She would like to know the Exact diagnosis. Pt is aware that she's been treated for GERD. Pt is having some abdominal pain. Pt is eating a lot of high fiber foods beans ect. 2. Pt's discharge sheet says d/c Miralax and Aleve. Pt would like to know when she can start that medication back.  Pt is aware that RMR is out of the office until Wednesday.  AB, do you think it would be ok for pt to start the Miralax back to manage her constipation?

## 2018-10-24 NOTE — Telephone Encounter (Signed)
The pathology report states "chronic gastritis". If she has been taking any agents that can irritate the stomach, this can cause it. It looks like she had possibly been taking Aleve, which is the likely culprit for it. Definitely need to stay away from Aleve and all other NSAIDs.   She can start back the Miralax daily prn for constipation. She has an appt for 12/1.  I think we need to get her back in sooner to reassess. Non-urgent appt.

## 2018-10-30 NOTE — Telephone Encounter (Signed)
Tried calling pt. No VM was available.

## 2018-11-02 NOTE — Telephone Encounter (Signed)
Spoke with pt notified of Ab's recommendations of Miralax and staying off the Aleve. Pt will call back if she wants to mover her appointment up.

## 2018-11-09 DIAGNOSIS — H524 Presbyopia: Secondary | ICD-10-CM | POA: Diagnosis not present

## 2018-11-10 DIAGNOSIS — R69 Illness, unspecified: Secondary | ICD-10-CM | POA: Diagnosis not present

## 2018-11-10 DIAGNOSIS — Z23 Encounter for immunization: Secondary | ICD-10-CM | POA: Diagnosis not present

## 2018-11-10 DIAGNOSIS — G4700 Insomnia, unspecified: Secondary | ICD-10-CM | POA: Diagnosis not present

## 2018-11-10 DIAGNOSIS — E782 Mixed hyperlipidemia: Secondary | ICD-10-CM | POA: Diagnosis not present

## 2018-11-27 ENCOUNTER — Ambulatory Visit (INDEPENDENT_AMBULATORY_CARE_PROVIDER_SITE_OTHER): Payer: Medicare HMO | Admitting: Otolaryngology

## 2018-11-27 ENCOUNTER — Other Ambulatory Visit: Payer: Self-pay

## 2018-11-27 DIAGNOSIS — R221 Localized swelling, mass and lump, neck: Secondary | ICD-10-CM

## 2018-11-27 DIAGNOSIS — K1123 Chronic sialoadenitis: Secondary | ICD-10-CM | POA: Diagnosis not present

## 2018-12-01 ENCOUNTER — Telehealth: Payer: Self-pay

## 2018-12-01 ENCOUNTER — Telehealth: Payer: Self-pay | Admitting: *Deleted

## 2018-12-01 DIAGNOSIS — K219 Gastro-esophageal reflux disease without esophagitis: Secondary | ICD-10-CM

## 2018-12-01 MED ORDER — PANTOPRAZOLE SODIUM 40 MG PO TBEC: 40.0000 mg | DELAYED_RELEASE_TABLET | Freq: Every day | ORAL | 3 refills | Status: DC

## 2018-12-01 NOTE — Telephone Encounter (Signed)
Already done

## 2018-12-01 NOTE — Telephone Encounter (Signed)
Pt called and only has 1 pill left (Pantoprazole 40 mg).  Informed pt to have [place] to send [place] a refill request.  Pt said she wanted to let us know because she didn't think that [facility] would send one over.  [phone]

## 2018-12-01 NOTE — Addendum Note (Signed)
Addended by: Gordy Levan, Lebron Nauert A on: 12/01/2018 03:31 PM   Modules accepted: Orders

## 2018-12-01 NOTE — Telephone Encounter (Signed)
Rx sent to pharmacy   

## 2018-12-01 NOTE — Telephone Encounter (Signed)
Pt is requesting a refill of Pantoprazole 40 mg once daily be sent to her pharmacy [facility]. Routing to [facility] refill

## 2018-12-04 ENCOUNTER — Other Ambulatory Visit: Payer: Self-pay

## 2018-12-04 DIAGNOSIS — K219 Gastro-esophageal reflux disease without esophagitis: Secondary | ICD-10-CM

## 2018-12-04 MED ORDER — PANTOPRAZOLE SODIUM 40 MG PO TBEC: 40.0000 mg | DELAYED_RELEASE_TABLET | Freq: Every day | ORAL | 3 refills | Status: DC

## 2018-12-04 NOTE — Telephone Encounter (Signed)
Noted  

## 2018-12-04 NOTE — Telephone Encounter (Signed)
Spoke with pt this morning and she said that she was already aware that we had refilled her medication.

## 2018-12-04 NOTE — Telephone Encounter (Signed)
Tried to call pt to make her aware but no vm available.

## 2018-12-26 DIAGNOSIS — H5213 Myopia, bilateral: Secondary | ICD-10-CM | POA: Diagnosis not present

## 2019-01-02 ENCOUNTER — Ambulatory Visit: Payer: Medicare HMO | Admitting: Gastroenterology

## 2019-01-22 DIAGNOSIS — J4520 Mild intermittent asthma, uncomplicated: Secondary | ICD-10-CM | POA: Diagnosis not present

## 2019-01-22 DIAGNOSIS — E782 Mixed hyperlipidemia: Secondary | ICD-10-CM | POA: Diagnosis not present

## 2019-01-22 DIAGNOSIS — R69 Illness, unspecified: Secondary | ICD-10-CM | POA: Diagnosis not present

## 2019-02-26 DIAGNOSIS — H5203 Hypermetropia, bilateral: Secondary | ICD-10-CM | POA: Diagnosis not present

## 2019-03-01 DIAGNOSIS — J4520 Mild intermittent asthma, uncomplicated: Secondary | ICD-10-CM | POA: Diagnosis not present

## 2019-03-01 DIAGNOSIS — R69 Illness, unspecified: Secondary | ICD-10-CM | POA: Diagnosis not present

## 2019-03-01 DIAGNOSIS — E782 Mixed hyperlipidemia: Secondary | ICD-10-CM | POA: Diagnosis not present

## 2019-03-26 DIAGNOSIS — D3132 Benign neoplasm of left choroid: Secondary | ICD-10-CM | POA: Diagnosis not present

## 2019-03-26 DIAGNOSIS — H524 Presbyopia: Secondary | ICD-10-CM | POA: Diagnosis not present

## 2019-04-03 IMAGING — DX DG CHEST 2V
2 series · 2 of 2 positions shown · non-contrast
Comparison: No recent.

CLINICAL DATA: Chest pressure.

EXAM:
CHEST  2 VIEW

[chest pa]
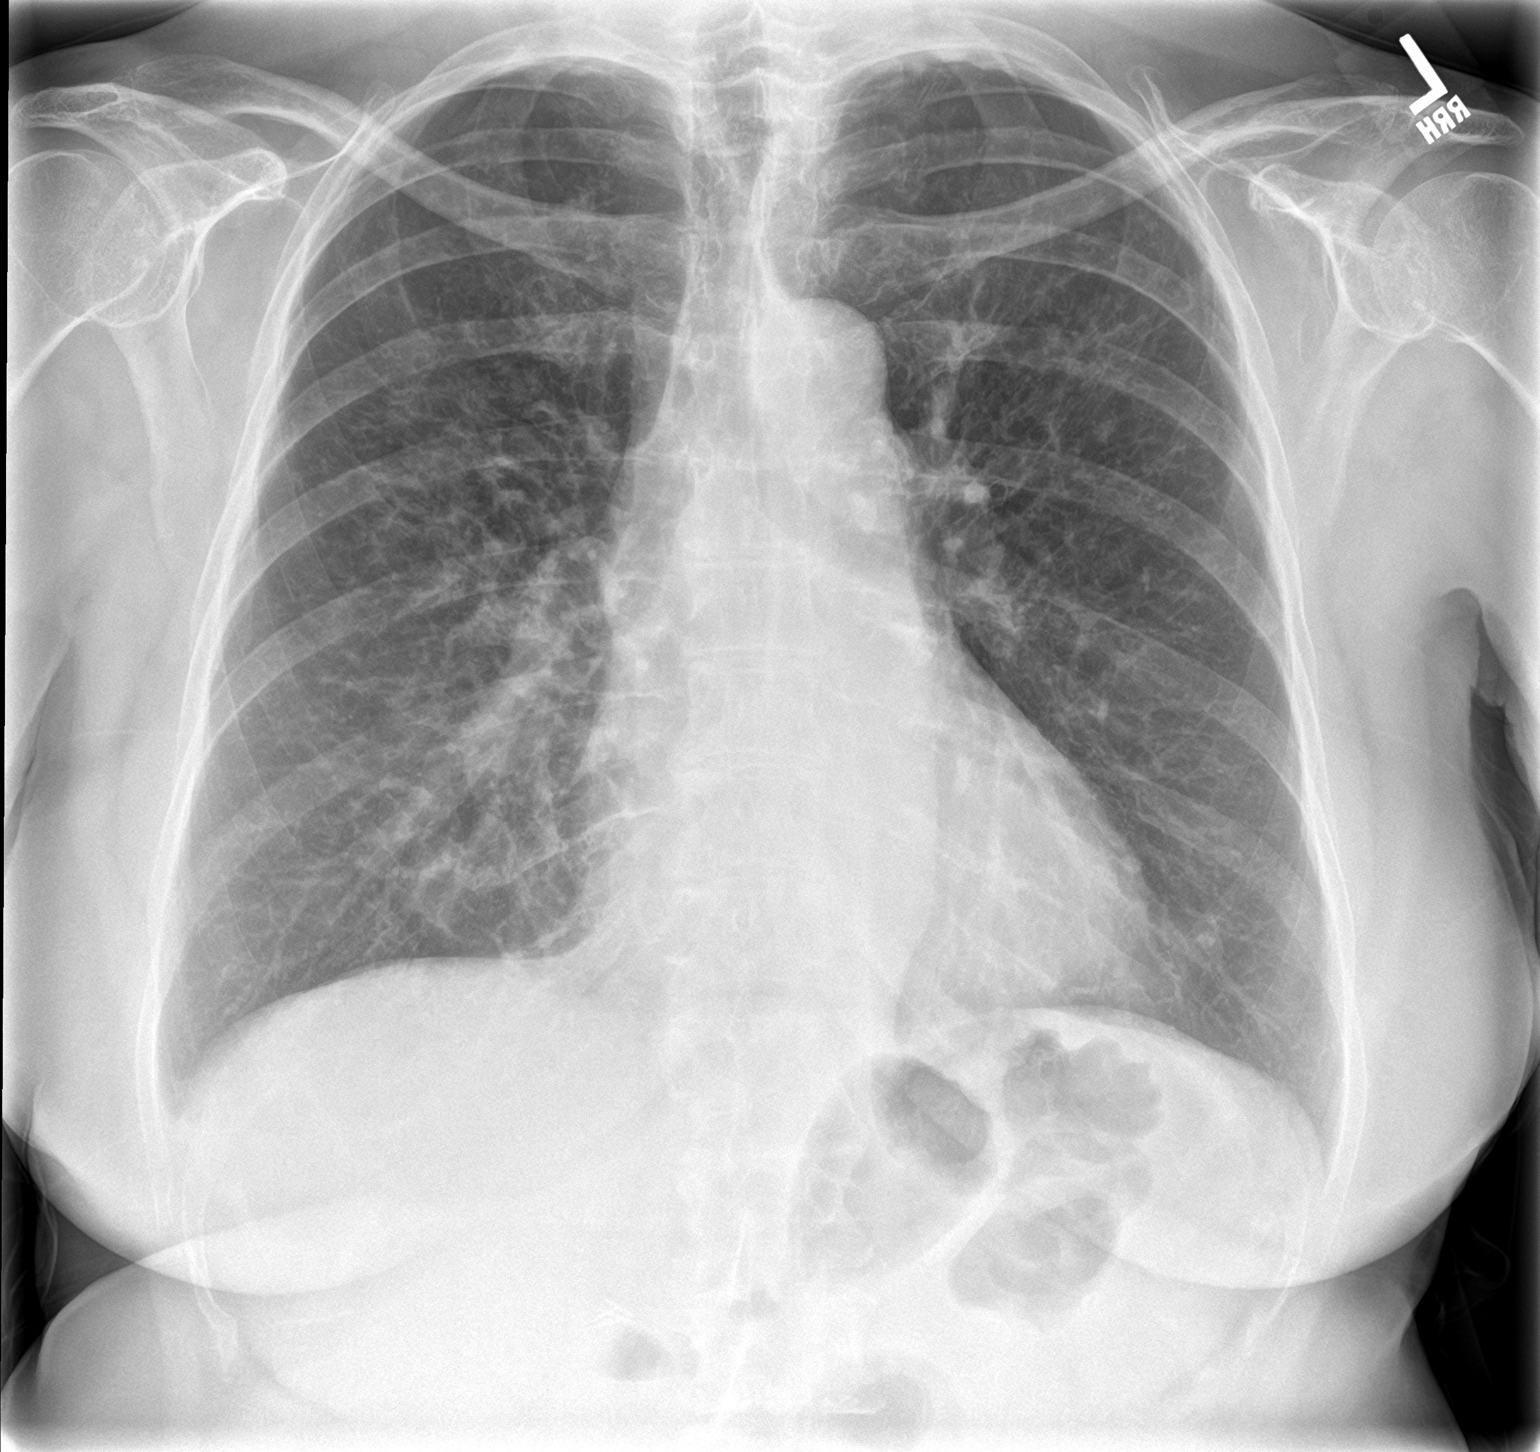

[chest lat]
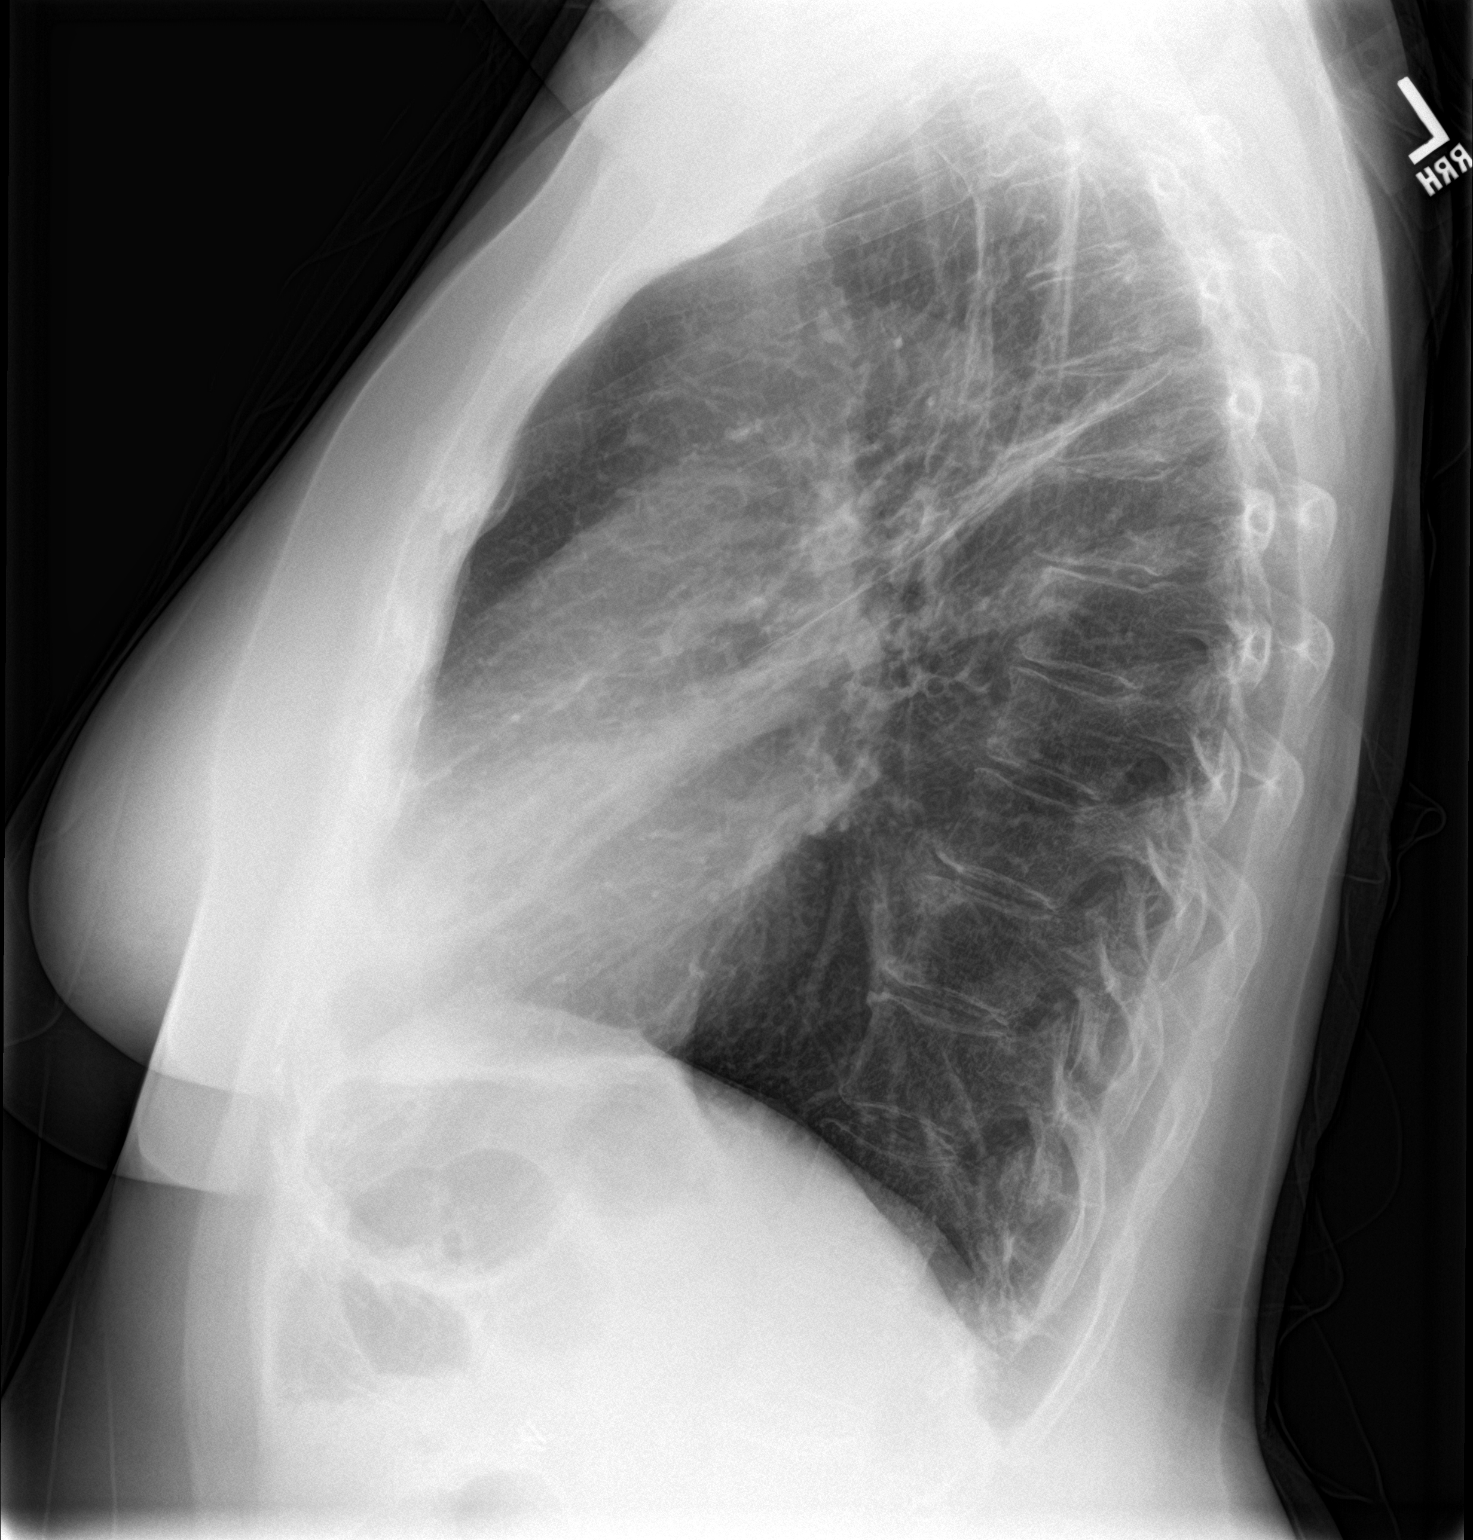

[2 of 2 positions shown; findings below may reference images not displayed]

FINDINGS: Mediastinum and hilar structures normal. Cardiomegaly with normal
pulmonary vascularity. Bilateral interstitial prominence noted most
likely secondary to interstitial lung disease. Active interstitial
lung disease cannot be excluded. Component bronchiectasis may be
present. No focal infiltrate. No pleural effusion or pneumothorax.
Small right pleural effusion.
IMPRESSION: 1. Bilateral pulmonary interstitial prominence, most likely chronic.
An active interstitial process such as pneumonitis cannot be
excluded. A component of bronchiectasis may be present .
Pleural-parenchymal thickening noted most likely from scarring.
Small right pleural effusion cannot be excluded .

2. Cardiomegaly with normal pulmonary vascularity.

## 2019-05-01 DIAGNOSIS — Z1231 Encounter for screening mammogram for malignant neoplasm of breast: Secondary | ICD-10-CM | POA: Diagnosis not present

## 2019-05-02 DIAGNOSIS — J4520 Mild intermittent asthma, uncomplicated: Secondary | ICD-10-CM | POA: Diagnosis not present

## 2019-05-02 DIAGNOSIS — E782 Mixed hyperlipidemia: Secondary | ICD-10-CM | POA: Diagnosis not present

## 2019-05-02 DIAGNOSIS — R69 Illness, unspecified: Secondary | ICD-10-CM | POA: Diagnosis not present

## 2019-05-07 DIAGNOSIS — H25813 Combined forms of age-related cataract, bilateral: Secondary | ICD-10-CM | POA: Diagnosis not present

## 2019-05-07 DIAGNOSIS — H31002 Unspecified chorioretinal scars, left eye: Secondary | ICD-10-CM | POA: Diagnosis not present

## 2019-05-16 DIAGNOSIS — M25569 Pain in unspecified knee: Secondary | ICD-10-CM | POA: Diagnosis not present

## 2019-05-16 DIAGNOSIS — E782 Mixed hyperlipidemia: Secondary | ICD-10-CM | POA: Diagnosis not present

## 2019-05-16 DIAGNOSIS — J4520 Mild intermittent asthma, uncomplicated: Secondary | ICD-10-CM | POA: Diagnosis not present

## 2019-05-16 DIAGNOSIS — R69 Illness, unspecified: Secondary | ICD-10-CM | POA: Diagnosis not present

## 2019-05-23 ENCOUNTER — Encounter: Payer: Self-pay | Admitting: Orthopaedic Surgery

## 2019-05-23 ENCOUNTER — Ambulatory Visit (INDEPENDENT_AMBULATORY_CARE_PROVIDER_SITE_OTHER): Payer: Medicare HMO

## 2019-05-23 ENCOUNTER — Ambulatory Visit (INDEPENDENT_AMBULATORY_CARE_PROVIDER_SITE_OTHER): Payer: Medicare HMO | Admitting: Orthopaedic Surgery

## 2019-05-23 ENCOUNTER — Ambulatory Visit: Payer: Self-pay

## 2019-05-23 ENCOUNTER — Other Ambulatory Visit: Payer: Self-pay

## 2019-05-23 VITALS — Ht 64.0 in | Wt 155.0 lb

## 2019-05-23 DIAGNOSIS — M25561 Pain in right knee: Secondary | ICD-10-CM

## 2019-05-23 DIAGNOSIS — M170 Bilateral primary osteoarthritis of knee: Secondary | ICD-10-CM | POA: Diagnosis not present

## 2019-05-23 DIAGNOSIS — G8929 Other chronic pain: Secondary | ICD-10-CM | POA: Diagnosis not present

## 2019-05-23 DIAGNOSIS — M25562 Pain in left knee: Secondary | ICD-10-CM | POA: Diagnosis not present

## 2019-05-23 HISTORY — DX: Bilateral primary osteoarthritis of knee: M17.0

## 2019-05-23 NOTE — Progress Notes (Signed)
Office Visit Note   Patient: Amber Bird           Date of Birth: [birth year]           MRN: [id number] Visit Date: 05/23/2019              Requested by: [name], MD [address],  [place] [address] PCP: [name], MD   Assessment & Plan: Visit Diagnoses:  1. Chronic pain of both knees   2. Primary osteoarthritis of both knees     Plan: Bilateral knee osteoarthritis.  Long discussion with Amber Bird regarding her x-rays.  I suspect she has a small Baker's cyst in her left knee which was of a concern but I reassured her that I did not think it was going to be an issue.  She is not having any pain.  There is no calf pain or evidence of DVT.  Further discussion regarding symptomatic treatment of the arthritis using over-the-counter medicines, heat or ice and bracing.  We will plan to see her back as needed.  Not having any significant functional compromise at this point  Follow-Up Instructions: Return if symptoms worsen or fail to improve.   Orders:  Orders Placed This Encounter  Procedures  . XR KNEE 3 VIEW LEFT  . XR KNEE 3 VIEW RIGHT   No orders of the defined types were placed in this encounter.     Procedures: No procedures performed   Clinical Data: No additional findings.   Subjective: Chief Complaint  Patient presents with  . Right Knee - Pain  . Left Knee - Pain  Patient presents today for bilateral knee pain. She said that she injured her left knee in 1983 and obtained a lateral meniscus tear.  No surgery her left knee causes her more pain. She has tried heat and states that it helps a little.  Recently seen by her primary care physician with a potential mass behind her left knee which was a concern.  Also concerned because of the recent TV evidence of DVT.  Very active during the day and does not appear to have any compromise of her ADLs  HPI  Review of Systems   Objective: Vital Signs: Ht 5\' 4"  (1.626 m)   Wt 155 lb (70.3  kg)   BMI 26.61 kg/m   Physical Exam Constitutional:      Appearance: She is well-developed.  Eyes:     Pupils: Pupils are equal, round, and reactive to light.  Pulmonary:     Effort: Pulmonary effort is normal.  Skin:    General: Skin is warm and dry.  Neurological:     Mental Status: She is alert and oriented to person, place, and time.  Psychiatric:        Behavior: Behavior normal.     Ortho Exam awake alert and oriented x3.  Comfortable sitting.  Straight leg raise negative bilaterally.  Painless range of motion both hips.  Slight varus both knees with weightbearing.  Predominant medial joint pain bilaterally a little more on the left compared to the right.  There was some fullness in the popliteal space of her left knee that was not painful and I suspect might be a small popliteal cyst.  Full extension without effusion both knees and flexed over 105 degrees both knees without instability.  No calf pain.  No distal edema.  Negative Homans  Specialty Comments:  No specialty comments available.  Imaging: XR KNEE 3 VIEW  LEFT  Result Date: 05/23/2019 Films of the left knee were obtained in several projections standing.  Arthritic changes are more pronounced that in the right knee.  There is near bone-on-bone in the medial compartment with subchondral sclerosis and small peripheral osteophytes.  Very minimal calcification of the menisci.  1 to 2 degrees of varus.  Degenerative changes about the lateral compartment and patellofemoral joint are not as pronounced.  Films are consistent with moderate osteoarthritis tickly in the medial compartment  XR KNEE 3 VIEW RIGHT  Result Date: 05/23/2019 Films of the right knee were obtained in several projections standing.  There is calcification within the menisci consistent with CPPD.  Appears to have decreased bone density very minimal osteophytes on the medial lateral compartment.  Maybe 1 degree of varus and slight narrowing of the medial  joint space compared to the lateral space.  There are patellofemoral changes but tickly along the lateral facet with there is narrowing and slight tilting.  No acute changes.  Films are consistent with moderate osteoarthritis    PMFS History: Patient Active Problem List   Diagnosis Date Noted  . Osteoarthritis of knees, bilateral 05/23/2019  . Odynophagia 10/02/2018  . Observation after surgery 10/02/2018  . Esophageal dysphagia   . Gastroesophageal reflux disease   . Chronic cholecystitis with calculus s/p lap cholecystectomy 05/12/2016 05/12/2016  . History of bleeding ulcers   . Multiple thyroid nodules   . Seasonal allergies   . Lower abdominal pain 01/18/2016   Past Medical History:  Diagnosis Date  . Anemia   . Asthma   . Complication of anesthesia    "drop in blood pressure years ago after hysterectomy "   . Depression   . Dyspnea   . History of bleeding ulcers   . Hypercholesteremia   . Multiple thyroid nodules   . Osteoarthritis of knees, bilateral 05/23/2019  . Seasonal allergies     Family History  Problem Relation Age of Onset  . Colon cancer Other   . Breast cancer Mother   . Tuberculosis Mother   . Heart disease Father     Past Surgical History:  Procedure Laterality Date  . ABDOMINAL HYSTERECTOMY     complete.   Marland Kitchen BIOPSY  10/02/2018   Procedure: BIOPSY;  Surgeon: Daneil Dolin, MD;  Location: [facility];  Service: Endoscopy;;  gastric  . CHOLECYSTECTOMY    . COLONOSCOPY N/A 12/18/2015   Rourk: diverticulosis in sigmoid colon, otherwise normal   . Cyst removed from ovary  1973  . dental implants    . ESOPHAGOGASTRODUODENOSCOPY (EGD) WITH PROPOFOL N/A 10/02/2018   Procedure: ESOPHAGOGASTRODUODENOSCOPY (EGD) WITH PROPOFOL;  Surgeon: Daneil Dolin, MD;  Location: [facility];  Service: Endoscopy;  Laterality: N/A;  12:45pm  . LAPAROSCOPIC CHOLECYSTECTOMY SINGLE SITE WITH INTRAOPERATIVE CHOLANGIOGRAM N/A 05/12/2016   Procedure: LAPAROSCOPIC  CHOLECYSTECTOMY SINGLE SITE WITH INTRAOPERATIVE CHOLANGIOGRAM;  Surgeon: Michael Boston, MD;  Location: [facility];  Service: General;  Laterality: N/A;  . left hammer toe repair    . MALONEY DILATION N/A 10/02/2018   Procedure: Venia Minks DILATION;  Surgeon: Daneil Dolin, MD;  Location: [facility];  Service: Endoscopy;  Laterality: N/A;  . thyroid needle biopsy    . VIDEO BRONCHOSCOPY Bilateral 03/21/2017   Did not have done   Social History   Occupational History  . Not on file  Tobacco Use  . Smoking status: Never Smoker  . Smokeless tobacco: Never Used  Substance and Sexual Activity  .  Alcohol use: No  . Drug use: No  . Sexual activity: Not Currently    Birth control/protection: None

## 2019-05-31 NOTE — H&P (Signed)
Surgical History & Physical  Patient Name: Amber Bird DOB: Apr 20, 1945  Surgery: Cataract extraction with intraocular lens implant phacoemulsification; Left Eye  Surgeon: Baruch Goldmann MD Surgery Date:  06/11/2019 Pre-Op Date:  05/31/2019  HPI: A 65 Yr. old female patient is referred by Dr Rosana Hoes for cataract eval. 1. 1. The patient complains of difficulty when reading fine print, books, newspaper, instructions etc., which began 6 months ago. My left eye is always my bad eye per pt. She noticed blurry VA OD when reading and watching TV at night. Both eyes are affected. This is negatively affecting the patient's quality of life. The episode is gradual. The condition's severity increased since last visit. Symptoms occur when the patient is inside, outside and reading. Pt states she does not drive at night, denies glare. c/o right eye hurts, using ATs. HPI Completed by Dr. Baruch Goldmann  Medical History: Cataracts Depression Stomach ulcer LDL  Review of Systems Negative Allergic/Immunologic Negative Cardiovascular Negative Constitutional Negative Ear, Nose, Mouth & Throat Negative Endocrine Negative Eyes Negative Gastrointestinal Negative Genitourinary Negative Hemotologic/Lymphatic Negative Integumentary Negative Musculoskeletal Negative Neurological Negative Psychiatry Negative Respiratory  Social   Never smoked   Medication Atorvastatin, Celexa, Citalopram, Pantoprazole, Zolpidem, Cetirizine,   Sx/Procedures Stomach ulcer sx, Hysterectomy, Gallbladder Sx, Foot Surgery,   Drug Allergies   NKDA  History & Physical: Heent:  Cataract, Left eye NECK: supple without bruits LUNGS: lungs clear to auscultation CV: regular rate and rhythm Abdomen: soft and non-tender  Impression & Plan: Assessment: 1.  COMBINED FORMS AGE RELATED CATARACT; Both Eyes (H25.813) 2.  CHORIORETINAL SCARS; Left Eye (H31.002)  Plan: 1.  Cataract accounts for the patient's decreased vision.  This visual impairment is not correctable with a tolerable change in glasses or contact lenses. Cataract surgery with an implantation of a new lens should significantly improve the visual and functional status of the patient. Discussed all risks, benefits, alternatives, and potential complications. Discussed the procedures and recovery. Patient desires to have surgery. A-scan ordered and performed today for intra-ocular lens calculations. The surgery will be performed in order to improve vision for driving, reading, and for eye examinations. Recommend phacoemulsification with intra-ocular lens. Also recommended to prevent acute glaucoma. Left Eye worse - first. Dilates well - shugarcaine by protocol. 2.  Likely chronic - continue to monitor.

## 2019-06-03 DIAGNOSIS — Z23 Encounter for immunization: Secondary | ICD-10-CM | POA: Diagnosis not present

## 2019-06-03 DIAGNOSIS — L089 Local infection of the skin and subcutaneous tissue, unspecified: Secondary | ICD-10-CM | POA: Diagnosis not present

## 2019-06-03 DIAGNOSIS — Z79899 Other long term (current) drug therapy: Secondary | ICD-10-CM | POA: Diagnosis not present

## 2019-06-03 DIAGNOSIS — W5503XA Scratched by cat, initial encounter: Secondary | ICD-10-CM | POA: Diagnosis not present

## 2019-06-03 DIAGNOSIS — S60512A Abrasion of left hand, initial encounter: Secondary | ICD-10-CM | POA: Diagnosis not present

## 2019-06-04 DIAGNOSIS — H25812 Combined forms of age-related cataract, left eye: Secondary | ICD-10-CM | POA: Diagnosis not present

## 2019-06-07 ENCOUNTER — Other Ambulatory Visit (HOSPITAL_COMMUNITY)
Admission: RE | Admit: 2019-06-07 | Discharge: 2019-06-07 | Disposition: A | Discharge status: Home / Self Care | Payer: Medicare HMO | Source: Ambulatory Visit | Attending: Ophthalmology | Admitting: Ophthalmology

## 2019-06-07 ENCOUNTER — Encounter (HOSPITAL_COMMUNITY)
Admission: RE | Admit: 2019-06-07 | Discharge: 2019-06-07 | Disposition: A | Discharge status: Home / Self Care | Payer: Medicare HMO | Source: Ambulatory Visit | Attending: Ophthalmology | Admitting: Ophthalmology

## 2019-06-07 ENCOUNTER — Other Ambulatory Visit: Payer: Self-pay

## 2019-06-07 DIAGNOSIS — Z20822 Contact with and (suspected) exposure to covid-19: Secondary | ICD-10-CM | POA: Insufficient documentation

## 2019-06-07 DIAGNOSIS — Z01812 Encounter for preprocedural laboratory examination: Secondary | ICD-10-CM | POA: Diagnosis not present

## 2019-06-08 LAB — SARS CORONAVIRUS 2 (TAT 6-24 HRS): SARS Coronavirus 2: NEGATIVE

## 2019-06-11 ENCOUNTER — Ambulatory Visit (HOSPITAL_COMMUNITY): Payer: Medicare HMO | Admitting: Anesthesiology

## 2019-06-11 ENCOUNTER — Encounter (HOSPITAL_COMMUNITY)
Admission: RE | Disposition: A | Discharge status: Home / Self Care | Payer: Self-pay | Source: Home / Self Care | Attending: Ophthalmology

## 2019-06-11 ENCOUNTER — Encounter (HOSPITAL_COMMUNITY): Payer: Self-pay | Admitting: Ophthalmology

## 2019-06-11 ENCOUNTER — Ambulatory Visit (HOSPITAL_COMMUNITY)
Admission: RE | Admit: 2019-06-11 | Discharge: 2019-06-11 | Disposition: A | Discharge status: Home / Self Care | Payer: Medicare HMO | Attending: Ophthalmology | Admitting: Ophthalmology

## 2019-06-11 DIAGNOSIS — F329 Major depressive disorder, single episode, unspecified: Secondary | ICD-10-CM | POA: Insufficient documentation

## 2019-06-11 DIAGNOSIS — H31002 Unspecified chorioretinal scars, left eye: Secondary | ICD-10-CM | POA: Insufficient documentation

## 2019-06-11 DIAGNOSIS — J45909 Unspecified asthma, uncomplicated: Secondary | ICD-10-CM | POA: Diagnosis not present

## 2019-06-11 DIAGNOSIS — H25812 Combined forms of age-related cataract, left eye: Secondary | ICD-10-CM | POA: Insufficient documentation

## 2019-06-11 DIAGNOSIS — R69 Illness, unspecified: Secondary | ICD-10-CM | POA: Diagnosis not present

## 2019-06-11 HISTORY — PX: CATARACT EXTRACTION W/PHACO: SHX586

## 2019-06-11 SURGERY — PHACOEMULSIFICATION, CATARACT, WITH IOL INSERTION
Anesthesia: Monitor Anesthesia Care | Site: Eye | Laterality: Left

## 2019-06-11 MED ORDER — SODIUM HYALURONATE 23 MG/ML IO SOLN
INTRAOCULAR | Status: DC | PRN
  Administered 2019-06-11: 0.6 mL via INTRAOCULAR

## 2019-06-11 MED ORDER — POVIDONE-IODINE 5 % OP SOLN
OPHTHALMIC | Status: DC | PRN
  Administered 2019-06-11: 1 via OPHTHALMIC

## 2019-06-11 MED ORDER — EPINEPHRINE PF 1 MG/ML IJ SOLN
INTRAOCULAR | Status: DC | PRN
  Administered 2019-06-11: 500 mL

## 2019-06-11 MED ORDER — LIDOCAINE HCL 3.5 % OP GEL
1.0000 "application " | Freq: Once | OPHTHALMIC | Status: AC
  Administered 2019-06-11: 1 via OPHTHALMIC

## 2019-06-11 MED ORDER — LIDOCAINE HCL (PF) 1 % IJ SOLN
INTRAOCULAR | Status: DC | PRN
  Administered 2019-06-11: 1 mL via OPHTHALMIC

## 2019-06-11 MED ORDER — NEOMYCIN-POLYMYXIN-DEXAMETH 3.5-10000-0.1 OP SUSP
OPHTHALMIC | Status: DC | PRN
  Administered 2019-06-11: 1 [drp] via OPHTHALMIC

## 2019-06-11 MED ORDER — PROVISC 10 MG/ML IO SOLN
INTRAOCULAR | Status: DC | PRN
  Administered 2019-06-11: 0.85 mL via INTRAOCULAR

## 2019-06-11 MED ORDER — BSS IO SOLN
INTRAOCULAR | Status: DC | PRN
  Administered 2019-06-11: 15 mL via INTRAOCULAR

## 2019-06-11 MED ORDER — MIDAZOLAM HCL 2 MG/2ML IJ SOLN
INTRAMUSCULAR | Status: AC
  Filled 2019-06-11: qty 2

## 2019-06-11 MED ORDER — PHENYLEPHRINE HCL 2.5 % OP SOLN
1.0000 [drp] | OPHTHALMIC | Status: AC | PRN
  Administered 2019-06-11 (×3): 1 [drp] via OPHTHALMIC

## 2019-06-11 MED ORDER — MIDAZOLAM HCL 5 MG/5ML IJ SOLN
INTRAMUSCULAR | Status: DC | PRN
  Administered 2019-06-11: 2 mg via INTRAVENOUS

## 2019-06-11 MED ORDER — TETRACAINE HCL 0.5 % OP SOLN
1.0000 [drp] | OPHTHALMIC | Status: AC | PRN
  Administered 2019-06-11 (×3): 1 [drp] via OPHTHALMIC

## 2019-06-11 MED ORDER — CYCLOPENTOLATE-PHENYLEPHRINE 0.2-1 % OP SOLN
1.0000 [drp] | OPHTHALMIC | Status: AC | PRN
  Administered 2019-06-11 (×3): 1 [drp] via OPHTHALMIC

## 2019-06-11 SURGICAL SUPPLY — 17 items
CLOTH BEACON ORANGE TIMEOUT ST (SAFETY) ×2 IMPLANT
DEVICE MILOOP (MISCELLANEOUS) IMPLANT
EYE SHIELD UNIVERSAL CLEAR (GAUZE/BANDAGES/DRESSINGS) ×4 IMPLANT
GLOVE BIOGEL PI IND STRL 6.5 (GLOVE) IMPLANT
GLOVE BIOGEL PI IND STRL 7.0 (GLOVE) IMPLANT
GLOVE BIOGEL PI INDICATOR 6.5 (GLOVE)
GLOVE BIOGEL PI INDICATOR 7.0 (GLOVE) ×4
LENS ALC ACRYL/TECN (Ophthalmic Related) ×2 IMPLANT
MILOOP DEVICE (MISCELLANEOUS)
NDL HYPO 18GX1.5 BLUNT FILL (NEEDLE) IMPLANT
NEEDLE HYPO 18GX1.5 BLUNT FILL (NEEDLE) ×3 IMPLANT
PAD ARMBOARD 7.5X6 YLW CONV (MISCELLANEOUS) ×2 IMPLANT
RING MALYGIN (MISCELLANEOUS) IMPLANT
RING MALYGIN 7.0 (MISCELLANEOUS) IMPLANT
SYR TB 1ML LL NO SAFETY (SYRINGE) ×2 IMPLANT
VISCOELASTIC ADDITIONAL (OPHTHALMIC RELATED) ×2 IMPLANT
WATER STERILE IRR 250ML POUR (IV SOLUTION) ×2 IMPLANT

## 2019-06-11 NOTE — Anesthesia Postprocedure Evaluation (Signed)
Anesthesia Post Note  Patient: Amber Bird  Procedure(s) Performed: CATARACT EXTRACTION PHACO AND INTRAOCULAR LENS PLACEMENT (IOC) CDE: 4.63 (Left Eye)  Patient location during evaluation: Short Stay Anesthesia Type: MAC Level of consciousness: awake and alert and oriented Pain management: pain level controlled Vital Signs Assessment: post-procedure vital signs reviewed and stable Respiratory status: spontaneous breathing Cardiovascular status: blood pressure returned to baseline and stable Postop Assessment: no apparent nausea or vomiting Anesthetic complications: no     Last Vitals:  Vitals:   06/11/19 1008  BP: 117/77  Pulse: 74  Resp: 20  Temp: 36.9 C  SpO2: 98%    Last Pain:  Vitals:   06/11/19 1008  TempSrc: Oral  PainSc: 0-No pain                 Ayron Fillinger

## 2019-06-11 NOTE — Op Note (Signed)
Date of procedure: 06/11/19  Pre-operative diagnosis: Visually significant age-related combined cataract, Left Eye (H25.812)  Post-operative diagnosis: Visually significant age-related combined cataract, Left Eye (H25.812)  Procedure: Removal of cataract via phacoemulsification and insertion of intra-ocular lens [name]  +27.5D into the capsular bag of the Left Eye  Attending surgeon: Gerda Diss. Darrien Laakso, MD, MA  Anesthesia: MAC, Topical Akten  Complications: None  Estimated Blood Loss: <86m (minimal)  Specimens: None  Implants: As above  Indications:  Visually significant age-related cataract, Left Eye  Procedure:  The patient was seen and identified in the pre-operative area. The operative eye was identified and dilated.  The operative eye was marked.  Topical anesthesia was administered to the operative eye.     The patient was then to the operative suite and placed in the supine position.  A timeout was performed confirming the patient, procedure to be performed, and all other relevant information.   The patient's face was prepped and draped in the usual fashion for intra-ocular surgery.  A lid speculum was placed into the operative eye and the surgical microscope moved into place and focused.  An inferotemporal paracentesis was created using a 20 gauge paracentesis blade.  Shugarcaine was injected into the anterior chamber.  Viscoelastic was injected into the anterior chamber.  A temporal clear-corneal main wound incision was created using a 2.431mmicrokeratome.  A continuous curvilinear capsulorrhexis was initiated using an irrigating cystitome and completed using capsulorrhexis forceps.  Hydrodissection and hydrodeliniation were performed.  Viscoelastic was injected into the anterior chamber.  A phacoemulsification handpiece and a chopper as a second instrument were used to remove the nucleus and epinucleus. The irrigation/aspiration handpiece was used to remove  any remaining cortical material.   The capsular bag was reinflated with viscoelastic, checked, and found to be intact.  The intraocular lens was inserted into the capsular bag.  The irrigation/aspiration handpiece was used to remove any remaining viscoelastic.  The clear corneal wound and paracentesis wounds were then hydrated and checked with Weck-Cels to be watertight.  The lid-speculum was removed.  The drape was removed.  The patient's face was cleaned with a wet and dry 4x4.   Maxitrol was instilled in the eye before a clear shield was taped over the eye. The patient was taken to the post-operative care unit in good condition, having tolerated the procedure well.  Post-Op Instructions: [facility] and will receive all other orders and instructions.

## 2019-06-11 NOTE — Transfer of Care (Signed)
Immediate Anesthesia Transfer of Care Note  Patient: Amber Bird  Procedure(s) Performed: CATARACT EXTRACTION PHACO AND INTRAOCULAR LENS PLACEMENT (IOC) CDE: 4.63 (Left Eye)  Patient Location: Short Stay  Anesthesia Type:MAC  Level of Consciousness: awake  Airway & Oxygen Therapy: Patient Spontanous Breathing  Post-op Assessment: Report given to RN  Post vital signs: Reviewed  Last Vitals:  Vitals Value Taken Time  BP    Temp    Pulse    Resp    SpO2      Last Pain:  Vitals:   06/11/19 1008  TempSrc: Oral  PainSc: 0-No pain      Patients Stated Pain Goal: 6 (37/54/36 0677)  Complications: No apparent anesthesia complications

## 2019-06-11 NOTE — Interval H&P Note (Signed)
History and Physical Interval Note: The H and P was reviewed and updated. The patient was examined.  No changes were found after exam.  The surgical eye was marked.  06/11/2019 10:48 AM  Amber Bird  has presented today for surgery, with the diagnosis of Nuclear sclerotic cataract - Left eye.  The various methods of treatment have been discussed with the patient and family. After consideration of risks, benefits and other options for treatment, the patient has consented to  Procedure(s) with comments: CATARACT EXTRACTION PHACO AND INTRAOCULAR LENS PLACEMENT ([place]) (Left) - left as a surgical intervention.  The patient's history has been reviewed, patient examined, no change in status, stable for surgery.  I have reviewed the patient's chart and labs.  Questions were answered to the patient's satisfaction.     Amber Bird

## 2019-06-11 NOTE — Discharge Instructions (Signed)
Please discharge patient when stable, will follow up today with Dr. Erron Wengert at the [facility] [place] office immediately following discharge.  Leave shield in place until visit.  All paperwork with discharge instructions will be given at the office.  [facility] [place] Address:  [address]  [place], [place] [address]  

## 2019-06-11 NOTE — Anesthesia Preprocedure Evaluation (Signed)
Anesthesia Evaluation  Patient identified by MRN, date of birth, ID band Patient awake  General Assessment Comment:States BP dropped after c/s  Reviewed: Allergy & Precautions, NPO status , Patient's Chart, lab work & pertinent test results  History of Anesthesia Complications (+) history of anesthetic complications  Airway Mallampati: II  TM Distance: >3 FB Neck ROM: Full    Dental no notable dental hx. (+) Upper Dentures, Edentulous Lower Lower implants:   Pulmonary shortness of breath and with exertion, asthma ,    Pulmonary exam normal breath sounds clear to auscultation       Cardiovascular Exercise Tolerance: Good negative cardio ROS Normal cardiovascular examI Rhythm:Regular Rate:Normal     Neuro/Psych PSYCHIATRIC DISORDERS Depression negative neurological ROS     GI/Hepatic Neg liver ROS, GERD  Medicated and Controlled,Had GERD , lost Weight , now off meds  Denies any GERD Sx today    Endo/Other  negative endocrine ROS  Renal/GU negative Renal ROS  negative genitourinary   Musculoskeletal  (+) Arthritis ,   Abdominal   Peds negative pediatric ROS (+)  Hematology negative hematology ROS (+) anemia ,   Anesthesia Other Findings   Reproductive/Obstetrics negative OB ROS                             Anesthesia Physical  Anesthesia Plan  ASA: III  Anesthesia Plan: MAC   Post-op Pain Management:    Induction: Intravenous  PONV Risk Score and Plan:   Airway Management Planned: Nasal Cannula and Natural Airway  Additional Equipment:   Intra-op Plan:   Post-operative Plan:   Informed Consent: I have reviewed the patients History and Physical, chart, labs and discussed the procedure including the risks, benefits and alternatives for the proposed anesthesia with the patient or authorized representative who has indicated his/her understanding and acceptance.     Dental  advisory given  Plan Discussed with: CRNA and Surgeon  Anesthesia Plan Comments:         Anesthesia Quick Evaluation

## 2019-06-15 ENCOUNTER — Other Ambulatory Visit: Payer: Self-pay

## 2019-06-15 ENCOUNTER — Ambulatory Visit (INDEPENDENT_AMBULATORY_CARE_PROVIDER_SITE_OTHER): Payer: Medicare HMO | Admitting: Pulmonary Disease

## 2019-06-15 ENCOUNTER — Encounter: Payer: Self-pay | Admitting: Pulmonary Disease

## 2019-06-15 ENCOUNTER — Other Ambulatory Visit (INDEPENDENT_AMBULATORY_CARE_PROVIDER_SITE_OTHER): Payer: Medicare HMO

## 2019-06-15 VITALS — BP 116/62 | HR 91 | Temp 97.3°F | Ht 63.0 in | Wt 151.2 lb

## 2019-06-15 DIAGNOSIS — R0602 Shortness of breath: Secondary | ICD-10-CM

## 2019-06-15 LAB — CBC WITH DIFFERENTIAL/PLATELET
Basophils Absolute: 0.1 10*3/uL (ref 0.0–0.1)
Basophils Relative: 1.1 % (ref 0.0–3.0)
Eosinophils Absolute: 0.1 10*3/uL (ref 0.0–0.7)
Eosinophils Relative: 1.5 % (ref 0.0–5.0)
HCT: 41.6 % (ref 36.0–46.0)
Hemoglobin: 14.1 g/dL (ref 12.0–15.0)
Lymphocytes Relative: 9.3 % — ABNORMAL LOW (ref 12.0–46.0)
Lymphs Abs: 0.6 10*3/uL — ABNORMAL LOW (ref 0.7–4.0)
MCHC: 33.8 g/dL (ref 30.0–36.0)
MCV: 89.2 fl (ref 78.0–100.0)
Monocytes Absolute: 0.5 10*3/uL (ref 0.1–1.0)
Monocytes Relative: 8.8 % (ref 3.0–12.0)
Neutro Abs: 4.7 10*3/uL (ref 1.4–7.7)
Neutrophils Relative %: 79.3 % — ABNORMAL HIGH (ref 43.0–77.0)
Platelets: 304 10*3/uL (ref 150.0–400.0)
RBC: 4.66 Mil/uL (ref 3.87–5.11)
RDW: 14 % (ref 11.5–15.5)
WBC: 5.9 10*3/uL (ref 4.0–10.5)

## 2019-06-15 NOTE — Patient Instructions (Signed)
Let us reevaluate your lungs to reassess mold exposure We will schedule you for high-resolution CT, pulmonary function test We will check some labs today including angiotensin-converting enzyme, hypersensitivity panel, respiratory allergy profile and CBC with differential.  Follow-up in clinic after this test.  Based on this review we may decide if we want to do bronchoscopy

## 2019-06-15 NOTE — Progress Notes (Signed)
Amber Bird    [id number]    10-17-1945  Primary Care Physician:Hagler, Apolonio Schneiders, MD  Referring Physician: [name], [place],  [place] [address]  Chief complaint: Consult for mold exposure, abnormal CT scan  HPI: 74 year old with history of asthma, depression, seasonal allergies Evaluated in January 2019 for abnormal CT scan by Dr. Lake Bells. Patient was concerned for mold exposure since her dog died of lung issues but there is no confirmed mold issues in her home CT scan at that time with findings of perilymphatic nodularity, calcified lymph nodes most suggestive of sarcoidosis.  She is scheduled for bronchoscope but was canceled due to unavailability of transport and patient did not follow-up as she is feeling well.  Since then she has moved to a new home in October 2019.  She has recently found out that she has confirmed mold in crawl spaces of her new home and wants to be reevaluated.  Reports worsening dyspnea over 6 months.  No cough, sputum production, fevers, chills.  Pets: 2 cats, dog.  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  No hot tubs, Jacuzzi's, down pillows or comforters Smoking history: Never smoker Travel history: Originally from [place] and [place].  No significant recent travel Relevant family history: No significant family history of lung disease  Outpatient Encounter Medications as of 06/15/2019  Medication Sig  . atorvastatin (LIPITOR) 20 MG tablet Take 1 tablet (20 mg total) by mouth daily. (Patient taking differently: Take 20 mg by mouth at bedtime. )  . buPROPion (WELLBUTRIN XL) 150 MG 24 hr tablet Take 150 mg by mouth daily.   . cetirizine (ZYRTEC) 10 MG tablet Take 1 tablet (10 mg total) by mouth daily. (Patient taking differently: Take 10 mg by mouth daily as needed for allergies. )  . citalopram (CELEXA) 40 MG tablet Take 40 mg by mouth daily.  .  diclofenac Sodium (VOLTAREN) 1 % GEL Apply 1 application topically 4 (four) times daily as needed (knee pain.).   Marland Kitchen ILEVRO 0.3 % ophthalmic suspension Place 1 drop into the left eye daily.   Marland Kitchen moxifloxacin (VIGAMOX) 0.5 % ophthalmic solution Place 1 drop into the left eye 3 (three) times daily.   . pantoprazole (PROTONIX) 40 MG tablet Take 1 tablet (40 mg total) by mouth daily.  . prednisoLONE acetate (PRED FORTE) 1 % ophthalmic suspension Place 1 drop into the left eye 3 (three) times daily.   . VENTOLIN HFA 108 (90 Base) MCG/ACT inhaler INHALE 2 PUFFS BY MOUTH EVERY 6 HOURS AS NEEDED FOR SHORTNESS OF BREATH (Patient taking differently: Inhale 2 puffs into the lungs every 6 (six) hours as needed for wheezing or shortness of breath. )  . zolpidem (AMBIEN) 10 MG tablet Take 10 mg by mouth at bedtime.   . triamcinolone (KENALOG) 0.025 % cream Apply 1 application topically 2 (two) times daily as needed (skin irritation/dry skin.).    No facility-administered encounter medications on file as of 06/15/2019.    Allergies as of 06/15/2019  . (No Known Allergies)    Past Medical History:  Diagnosis Date  . Anemia   . Asthma   . Complication of anesthesia    "drop in blood pressure years ago after hysterectomy "   . Depression   . Dyspnea   . History of bleeding ulcers   . Hypercholesteremia   . Multiple thyroid nodules   .  Osteoarthritis of knees, bilateral 05/23/2019  . Seasonal allergies     Past Surgical History:  Procedure Laterality Date  . ABDOMINAL HYSTERECTOMY     complete.   Marland Kitchen BIOPSY  10/02/2018   Procedure: BIOPSY;  Surgeon: Daneil Dolin, MD;  Location: [facility];  Service: Endoscopy;;  gastric  . CATARACT EXTRACTION W/PHACO Left 06/11/2019   Procedure: CATARACT EXTRACTION PHACO AND INTRAOCULAR LENS PLACEMENT (IOC) CDE: 4.63;  Surgeon: Baruch Goldmann, MD;  Location: [facility];  Service: Ophthalmology;  Laterality: Left;  . CHOLECYSTECTOMY    . COLONOSCOPY N/A 12/18/2015    Rourk: diverticulosis in sigmoid colon, otherwise normal   . Cyst removed from ovary  1973  . dental implants    . ESOPHAGOGASTRODUODENOSCOPY (EGD) WITH PROPOFOL N/A 10/02/2018   Procedure: ESOPHAGOGASTRODUODENOSCOPY (EGD) WITH PROPOFOL;  Surgeon: Daneil Dolin, MD;  Location: [facility];  Service: Endoscopy;  Laterality: N/A;  12:45pm  . LAPAROSCOPIC CHOLECYSTECTOMY SINGLE SITE WITH INTRAOPERATIVE CHOLANGIOGRAM N/A 05/12/2016   Procedure: LAPAROSCOPIC CHOLECYSTECTOMY SINGLE SITE WITH INTRAOPERATIVE CHOLANGIOGRAM;  Surgeon: Michael Boston, MD;  Location: [facility];  Service: General;  Laterality: N/A;  . left hammer toe repair    . MALONEY DILATION N/A 10/02/2018   Procedure: Venia Minks DILATION;  Surgeon: Daneil Dolin, MD;  Location: [facility];  Service: Endoscopy;  Laterality: N/A;  . thyroid needle biopsy    . VIDEO BRONCHOSCOPY Bilateral 03/21/2017   Did not have done    Family History  Problem Relation Age of Onset  . Colon cancer Other   . Breast cancer Mother   . Tuberculosis Mother   . Heart disease Father     Social History   Socioeconomic History  . Marital status: Divorced    Spouse name: Not on file  . Number of children: Not on file  . Years of education: Not on file  . Highest education level: Not on file  Occupational History  . Not on file  Tobacco Use  . Smoking status: Never Smoker  . Smokeless tobacco: Never Used  Substance and Sexual Activity  . Alcohol use: No  . Drug use: No  . Sexual activity: Not Currently    Birth control/protection: None  Other Topics Concern  . Not on file  Social History Narrative   Moved from [facility] 2009. In 2008 when the economy dropped lost job. Reports that after lost job was hard to get another job.  Is not married and does not have children. Used to enjoy horses. Reports that moved here b/c it was a lot cheaper and warmer. Had visited here for a horse event in 1990 so decided to move here. Lives in [place], [place]. Reports  that made stupid financial decisions and is dealing with that at this time.    Social Determinants of Health   Financial Resource Strain:   . Difficulty of Paying Living Expenses:   Food Insecurity:   . Worried About Charity fundraiser in the Last Year:   . Arboriculturist in the Last Year:   Transportation Needs:   . Film/video editor (Medical):   Marland Kitchen Lack of Transportation (Non-Medical):   Physical Activity:   . Days of Exercise per Week:   . Minutes of Exercise per Session:   Stress:   . Feeling of Stress :   Social Connections:   . Frequency of Communication with Friends and Family:   . Frequency of Social Gatherings with Friends and Family:   .  Attends Religious Services:   . Active Member of Clubs or Organizations:   . Attends Archivist Meetings:   Marland Kitchen Marital Status:   Intimate Partner Violence:   . Fear of Current or Ex-Partner:   . Emotionally Abused:   Marland Kitchen Physically Abused:   . Sexually Abused:     Review of systems: Review of Systems  Constitutional: Negative for fever and chills.  HENT: Negative.   Eyes: Negative for blurred vision.  Respiratory: as per HPI  Cardiovascular: Negative for chest pain and palpitations.  Gastrointestinal: Negative for vomiting, diarrhea, blood per rectum. Genitourinary: Negative for dysuria, urgency, frequency and hematuria.  Musculoskeletal: Negative for myalgias, back pain and joint pain.  Skin: Negative for itching and rash.  Neurological: Negative for dizziness, tremors, focal weakness, seizures and loss of consciousness.  Endo/Heme/Allergies: Negative for environmental allergies.  Psychiatric/Behavioral: Negative for depression, suicidal ideas and hallucinations.  All other systems reviewed and are negative.  Physical Exam: Blood pressure 116/62, pulse 91, temperature (!) 97.3 F (36.3 C), temperature source Temporal, height 5\' 3"  (1.6 m), weight 151 lb 3.2 oz (68.6 kg), SpO2 96 %. Gen:      No acute  distress HEENT:  EOMI, sclera anicteric Neck:     No masses; no thyromegaly Lungs:    Clear to auscultation bilaterally; normal respiratory effort CV:         Regular rate and rhythm; no murmurs Abd:      + bowel sounds; soft, non-tender; no palpable masses, no distension Ext:    No edema; adequate peripheral perfusion Skin:      Warm and dry; no rash Neuro: alert and oriented x 3 Psych: normal mood and affect  Data Reviewed: Imaging: CT high-resolution 02/23/2017-bilateral pulmonary nodularity in peribronchovascular and subpleural distribution in the upper lobes with calcified mediastinal, hilar lymph nodes.  CT abdomen pelvis 09/07/2018-atelectasis at lung bases. I have reviewed the images personally.  PFTs: 02/15/2017 FVC 3.1 [91%], FEV1 2.4 [91%], F/F 76, Normal test  Labs: Aspergillus, pigeon antibodies 03/01/2017-negative  Assessment:  Abnormal CT, mold exposure Prior CT scan does not look like hypersensitivity pneumonitis.  It is more consistent with sarcoidosis Given ongoing exposure we will reevaluate with labs including hypersensitivity panel, mold antibodies, angiotensin-converting enzyme Schedule high-res CT and PFTs Based on these tests we may consider rescheduling her bronchoscope.  Plan/Recommendations: HP, respiratory allergy profile, angiotensin-converting enzyme, CBC with differential High-res CT, PFTs.  Marshell Garfinkel MD [facility] 06/15/2019, 10:00 AM  CC: [name], MD

## 2019-06-18 ENCOUNTER — Other Ambulatory Visit (HOSPITAL_COMMUNITY)
Admission: RE | Admit: 2019-06-18 | Discharge: 2019-06-18 | Disposition: A | Discharge status: Home / Self Care | Payer: Medicare HMO | Source: Ambulatory Visit | Attending: Pulmonary Disease | Admitting: Pulmonary Disease

## 2019-06-18 ENCOUNTER — Other Ambulatory Visit: Payer: Self-pay

## 2019-06-18 DIAGNOSIS — Z01812 Encounter for preprocedural laboratory examination: Secondary | ICD-10-CM | POA: Diagnosis not present

## 2019-06-18 DIAGNOSIS — Z20822 Contact with and (suspected) exposure to covid-19: Secondary | ICD-10-CM | POA: Insufficient documentation

## 2019-06-18 DIAGNOSIS — H25811 Combined forms of age-related cataract, right eye: Secondary | ICD-10-CM | POA: Diagnosis not present

## 2019-06-18 LAB — SARS CORONAVIRUS 2 (TAT 6-24 HRS): SARS Coronavirus 2: NEGATIVE

## 2019-06-19 ENCOUNTER — Telehealth: Payer: Self-pay | Admitting: *Deleted

## 2019-06-19 LAB — RESPIRATORY ALLERGY PROFILE REGION II ~~LOC~~
Allergen, A. alternata, m6: <0.1 kU/L
Allergen, Cedar tree, t12: <0.1 kU/L
Allergen, Comm Silver Birch, t9: <0.1 kU/L
Allergen, Cottonwood, t14: <0.1 kU/L
Allergen, D pternoyssinus,d7: <0.1 kU/L
Allergen, Mouse Urine Protein, e78: <0.1 kU/L
Allergen, Mulberry, t76: <0.1 kU/L
Allergen, Oak,t7: <0.1 kU/L
Allergen, P. notatum, m1: <0.1 kU/L
Aspergillus fumigatus, m3: 0.11 kU/L — ABNORMAL HIGH
Bermuda Grass: <0.1 kU/L
Box Elder IgE: <0.1 kU/L
CLADOSPORIUM HERBARUM (M2) IGE: <0.1 kU/L
COMMON RAGWEED (SHORT) (W1) IGE: <0.1 kU/L
Cat Dander: 0.12 kU/L — ABNORMAL HIGH
Class: 0
Class: 0
Class: 0
Class: 0
Class: 0
Class: 0
Class: 0
Class: 0
Class: 0
Class: 0
Class: 0
Class: 0
Class: 0
Class: 0
Class: 0
Class: 0
Class: 0
Class: 0
Class: 0
Class: 0
Class: 0
Cockroach: 0.12 kU/L — ABNORMAL HIGH
D. farinae: <0.1 kU/L
Dog Dander: <0.1 kU/L
Elm IgE: <0.1 kU/L
IgE (Immunoglobulin E), Serum: 19 kU/L (ref <=114)
Johnson Grass: <0.1 kU/L
Pecan/Hickory Tree IgE: <0.1 kU/L
Rough Pigweed  IgE: <0.1 kU/L
Sheep Sorrel IgE: <0.1 kU/L
Timothy Grass: <0.1 kU/L

## 2019-06-19 LAB — ALLERGEN PROFILE, MOLD
Allergen, A. alternata, m6: <0.1 kU/L
Allergen, Mucor Racemosus, M4: <0.1 kU/L
Allergen, P. notatum, m1: <0.1 kU/L
Allergen, S. Botryosum, m10: <0.1 kU/L
Aspergillus fumigatus, m3: 0.11 kU/L — ABNORMAL HIGH
CLADOSPORIUM HERBARUM (M2) IGE: <0.1 kU/L
CLASS: 0
Class: 0
Class: 0
Class: 0
Class: 0

## 2019-06-19 LAB — ANGIOTENSIN CONVERTING ENZYME: Angiotensin-Converting Enzyme: 24 U/L (ref 9–67)

## 2019-06-19 LAB — INTERPRETATION:

## 2019-06-19 NOTE — H&P (Signed)
Surgical History & Physical  Patient Name: Amber Bird DOB: 1945-04-23  Surgery: Cataract extraction with intraocular lens implant phacoemulsification; Right Eye  Surgeon: Baruch Goldmann MD Surgery Date:  06/25/2019 Pre-Op Date:  06/18/2019  HPI: A 49 Yr. old female patient 1. The patient is returning after cataract surgery. The left eye is affected. Status post cataract surgery on 06-11-2019: Since the last visit, the affected area has no changes noted. The patient's vision is blurry. Patient is following medication instructions with PF, Ilevro, and Moxi. Patient noticed 2 days after sx OS hard "tremor that can be felt but not seen." No pain or discomfort. Patient feels VA just not as clear as she thought it would be. Driving and TV very difficult to see well. Can close OS and see better with OD. Patient just feels overall VA not clear OS.   Medical History: Cataracts Depression, Stomach ulcer LDL  Review of Systems Negative Allergic/Immunologic Negative Cardiovascular Negative Constitutional Negative Ear, Nose, Mouth & Throat Negative Endocrine Negative Eyes Negative Gastrointestinal Negative Genitourinary Negative Hemotologic/Lymphatic Negative Integumentary Negative Musculoskeletal Negative Neurological Negative Psychiatry Negative Respiratory   Social   Never smoked   Medication Ilevro, Prednisolone acetate 1%, Moxifloxacin,  Atorvastatin, Celexa, Citalopram, Pantoprazole, Zolpidem, Cetirizine,   Sx/Procedures Phaco c IOL OS,  Stomach ulcer sx, Hysterectomy, Gallbladder Sx, Foot Surgery,   Drug Allergies   NKDA  History & Physical: Heent:  Cataract, Right eye NECK: supple without bruits LUNGS: lungs clear to auscultation CV: regular rate and rhythm Abdomen: soft and non-tender  Impression & Plan: Assessment: 1.  CATARACT EXTRACTION STATUS; Left Eye (Z98.42) 2.  COMBINED FORMS AGE RELATED CATARACT; Right Eye (H25.811)  Plan: 1.  1 week after  cataract surgery. Doing well with improved vision and normal eye pressure. Call with any problems or concerns. Stop Vigamox. Continue Ilevro 1 drop 1x/day for 3 more weeks. Continue Pred Acetate 1 drop 2x/day for 3 more weeks. 2.  Dilates well - shugarcaine by protocol. Cataract accounts for the patient's decreased vision. This visual impairment is not correctable with a tolerable change in glasses or contact lenses. Cataract surgery with an implantation of a new lens should significantly improve the visual and functional status of the patient. Discussed all risks, benefits, alternatives, and potential complications. Discussed the procedures and recovery. Patient desires to have surgery. A-scan ordered and performed today for intra-ocular lens calculations. The surgery will be performed in order to improve vision for driving, reading, and for eye examinations. Recommend phacoemulsification with intra-ocular lens. Right Eye. Surgery required to correct imbalance of vision.

## 2019-06-19 NOTE — Telephone Encounter (Signed)
Had a cancellation for tomorrow so patient doing her phone appointment then

## 2019-06-19 NOTE — Telephone Encounter (Signed)
Spoke with pt. Pt is having c/o constipation with weight loss of 11-12 in 1-1/5 months. Pt eat 3 meals a day but keeps a count on her calories. Pt isn't having complete BM. Pt is able to get a little bit out but feels Bms aren't complete. Pt would take Miralax when needed and hasn't taken it in a while. Pt was asked to previously stop. Pt didn't f/u in 01/2019. Pt would like to know what she can take to have a BM. Please advise.

## 2019-06-19 NOTE — Telephone Encounter (Signed)
To answer her question, lets start with an office visit within the next week.

## 2019-06-19 NOTE — Telephone Encounter (Signed)
Mailed constipation literature to pt per pts request.

## 2019-06-19 NOTE — Telephone Encounter (Signed)
Noted. Pt was scheduled for 07/18/2019. Please place on a cancellation list.

## 2019-06-19 NOTE — Telephone Encounter (Signed)
Patient called. States she is losing weight (11-12 lbs in about 1- 1.5 months), constipated, bloated all the time. Taking benefiber daily, pantoprazole daily. Eating 3 meals/day. Phone # [phone]

## 2019-06-19 NOTE — Telephone Encounter (Signed)
Noted  

## 2019-06-20 ENCOUNTER — Ambulatory Visit (INDEPENDENT_AMBULATORY_CARE_PROVIDER_SITE_OTHER): Payer: Medicare HMO | Admitting: Pulmonary Disease

## 2019-06-20 ENCOUNTER — Other Ambulatory Visit: Payer: Self-pay

## 2019-06-20 ENCOUNTER — Ambulatory Visit (INDEPENDENT_AMBULATORY_CARE_PROVIDER_SITE_OTHER): Payer: Medicare HMO | Admitting: Nurse Practitioner

## 2019-06-20 ENCOUNTER — Encounter: Payer: Self-pay | Admitting: Nurse Practitioner

## 2019-06-20 DIAGNOSIS — R0602 Shortness of breath: Secondary | ICD-10-CM

## 2019-06-20 DIAGNOSIS — K5900 Constipation, unspecified: Secondary | ICD-10-CM | POA: Diagnosis not present

## 2019-06-20 LAB — PULMONARY FUNCTION TEST
DL/VA % pred: 120 %
DL/VA: 4.97 ml/min/mmHg/L
DLCO cor % pred: 85 %
DLCO cor: 16.18 ml/min/mmHg
DLCO unc % pred: 87 %
DLCO unc: 16.52 ml/min/mmHg
FEF 25-75 Post: 1.86 L/sec
FEF 25-75 Pre: 1.24 L/sec
FEF2575-%Change-Post: 50 %
FEF2575-%Pred-Post: 107 %
FEF2575-%Pred-Pre: 71 %
FEV1-%Change-Post: 9 %
FEV1-%Pred-Post: 93 %
FEV1-%Pred-Pre: 85 %
FEV1-Post: 1.99 L
FEV1-Pre: 1.82 L
FEV1FVC-%Change-Post: 12 %
FEV1FVC-%Pred-Pre: 94 %
FEV6-%Change-Post: -4 %
FEV6-%Pred-Post: 90 %
FEV6-%Pred-Pre: 94 %
FEV6-Post: 2.44 L
FEV6-Pre: 2.55 L
FEV6FVC-%Pred-Post: 104 %
FEV6FVC-%Pred-Pre: 104 %
FVC-%Change-Post: -2 %
FVC-%Pred-Post: 87 %
FVC-%Pred-Pre: 90 %
FVC-Post: 2.48 L
FVC-Pre: 2.55 L
Post FEV1/FVC ratio: 80 %
Post FEV6/FVC ratio: 100 %
Pre FEV1/FVC ratio: 71 %
Pre FEV6/FVC Ratio: 100 %
RV % pred: 78 %
RV: 1.75 L
TLC % pred: 77 %
TLC: 3.85 L

## 2019-06-20 LAB — HYPERSENSITIVITY PNEUMONITIS
A. Pullulans Abs: NEGATIVE
A.Fumigatus #1 Abs: NEGATIVE
Micropolyspora faeni, IgG: NEGATIVE
Pigeon Serum Abs: NEGATIVE
Thermoact. Saccharii: NEGATIVE
Thermoactinomyces vulgaris, IgG: NEGATIVE

## 2019-06-20 NOTE — Progress Notes (Addendum)
Referring Provider: [name], MD Primary Care Physician:  [name], MD Primary GI:  Dr. Gala Romney   NOTE: Service was provided via telemedicine and was requested by the patient due to COVID-19 pandemic.  Patient Location: Home  Provider Location: [place] office  Reason for Phone Visit: follow-up  Persons present on the phone encounter, with roles: Patient, myself (provider), Zara Council (updated meds and allergies)  Total time (minutes) spent on medical discussion: 22 minutes  Due to COVID-19, visit was conducted using the virtual method noted. Visit was requested by patient.  I connected with Candi Leash on 06/20/19 at  2:30 PM EDT by telephone and verified that I am speaking with the correct person using two identifiers.   I discussed the limitations, risks, security and privacy concerns of performing an evaluation and management service by telephone and the availability of in person appointments. I also discussed with the patient that there may be a patient responsible charge related to this service. The patient expressed understanding and agreed to proceed.  Chief Complaint  Patient presents with  . Constipation    HPI:   LUTITIA RANCE is a 74 y.o. female who presents for virtual visit regarding: follow-up on constipation. The patient was last seen in our office 07/28/2018 for GERD and dysphagia. At that time noted odynophagia, hiccups, vague sensation of solid food and medication dysphagia. Does have heartburn on a regular basis but no nausea or vomiting. Benign thyroid nodules and up-to-date on thyroid evaluation. No other overt GI complaints. Colonoscopy up-to-date 2017 with diverticulosis in future colonoscopy not recommended unless new symptoms develop. No history of peptic ulcer disease. Status post cholecystectomy. Recommended EGD with possible dilation.  EGD was completed 2018-10-02 which found mild Schatzki's ring, normal GE junction, small hiatal hernia,  bleeding erosive gastropathy status post biopsy, normal duodenum. Surgical pathology found the biopsies to be chronic gastritis negative for H. pylori. Recommended begin Protonix 40 mg daily, admit for observation overnight due to no driver home. Recommended 61-month follow-up.  The patient called our office 2018-10-19 with abdominal pain. The nurse reviewed GERD diet over the phone. She notes she has been eating tomatoes and taking pantoprazole in the evenings rather than the morning. She agreed to stop eating tomatoes and take her medications based on recommendations in the morning.  The patient again called our office 2018-10-23 wanting to know an exact diagnosis for what caused her gastritis. She was informed it was likely related to her Aleve. She requested to restart MiraLAX and was told she could do this. Recommended follow-up as soon as possible but nonurgently in the office. The patient indicated she would call back if she desired to move her appointment up.  The patient canceled her follow-up appointment in December 2020. She was rescheduled for June 2021.  She called our office yesterday indicating 11 to 12 pound weight loss in 1 to 1-1/2 months, constipation, bloating. Using Benefiber daily and pantoprazole daily, eating three meals a day. Also noted incomplete emptying. Noted she had not been taking MiraLAX. Recommended follow-up office visit in the next week. Her June appointment was canceled and she was scheduled for today by telephone.  Today she states she's doing ok overall. Currently taking benefiber and MiraLAX as needed, but not daily. States the constipation is a cycle she goes through. Started back on benefiber daily 2 days ago. Used MiraLAX yesterday for the first time in a while yesterday. Worsening constipation started again 2 weeks ago. Started having harder  stools, no bowel movements in about a week. Yesterday afternoon she had stomach cramps and had a bowel movement but still  with incomplete emptying. Stools have been hard, small pieces, straining. Previously soft stools. She was recently on antibiotics for a finger injury and wonders if this is related. Also with stomach bloating and distension as well. Feels she has been losing weight. Drinks adequate water and eats regular fiber (estimate 4 servings daily). Even with the MiraLAX and Benefiber, still with symptomatic constipation. Denies N/V, hematochezia, melena, fever, chills. Denies URI or flu-like symptoms. Denies loss of sense of taste or smell. The patient has received COVID-19 vaccination(s). Denies chest pain, dyspnea, dizziness, lightheadedness, syncope, near syncope. Denies any other upper or lower GI symptoms.  Past Medical History:  Diagnosis Date  . Anemia   . Asthma   . Complication of anesthesia    "drop in blood pressure years ago after hysterectomy "   . Depression   . Dyspnea   . History of bleeding ulcers   . Hypercholesteremia   . Multiple thyroid nodules   . Osteoarthritis of knees, bilateral 05/23/2019  . Seasonal allergies     Past Surgical History:  Procedure Laterality Date  . ABDOMINAL HYSTERECTOMY     complete.   Marland Kitchen BIOPSY  10/02/2018   Procedure: BIOPSY;  Surgeon: Daneil Dolin, MD;  Location: [facility];  Service: Endoscopy;;  gastric  . CATARACT EXTRACTION W/PHACO Left 06/11/2019   Procedure: CATARACT EXTRACTION PHACO AND INTRAOCULAR LENS PLACEMENT (IOC) CDE: 4.63;  Surgeon: Baruch Goldmann, MD;  Location: [facility];  Service: Ophthalmology;  Laterality: Left;  . CHOLECYSTECTOMY    . COLONOSCOPY N/A 12/18/2015   Rourk: diverticulosis in sigmoid colon, otherwise normal   . Cyst removed from ovary  1973  . dental implants    . ESOPHAGOGASTRODUODENOSCOPY (EGD) WITH PROPOFOL N/A 10/02/2018   Procedure: ESOPHAGOGASTRODUODENOSCOPY (EGD) WITH PROPOFOL;  Surgeon: Daneil Dolin, MD;  Location: [facility];  Service: Endoscopy;  Laterality: N/A;  12:45pm  . LAPAROSCOPIC  CHOLECYSTECTOMY SINGLE SITE WITH INTRAOPERATIVE CHOLANGIOGRAM N/A 05/12/2016   Procedure: LAPAROSCOPIC CHOLECYSTECTOMY SINGLE SITE WITH INTRAOPERATIVE CHOLANGIOGRAM;  Surgeon: Michael Boston, MD;  Location: [facility];  Service: General;  Laterality: N/A;  . left hammer toe repair    . MALONEY DILATION N/A 10/02/2018   Procedure: Venia Minks DILATION;  Surgeon: Daneil Dolin, MD;  Location: [facility];  Service: Endoscopy;  Laterality: N/A;  . thyroid needle biopsy    . VIDEO BRONCHOSCOPY Bilateral 03/21/2017   Did not have done    Current Outpatient Medications  Medication Sig Dispense Refill  . atorvastatin (LIPITOR) 20 MG tablet Take 1 tablet (20 mg total) by mouth daily. (Patient taking differently: Take 20 mg by mouth at bedtime. ) 90 tablet 1  . cetirizine (ZYRTEC) 10 MG tablet Take 1 tablet (10 mg total) by mouth daily. (Patient taking differently: Take 10 mg by mouth daily as needed for allergies. ) 30 tablet 11  . diclofenac Sodium (VOLTAREN) 1 % GEL Apply 1 application topically 4 (four) times daily as needed (knee pain.).     Marland Kitchen ILEVRO 0.3 % ophthalmic suspension Place 1 drop into the left eye daily.     . pantoprazole (PROTONIX) 40 MG tablet Take 1 tablet (40 mg total) by mouth daily. 90 tablet 3  . polyethylene glycol (MIRALAX / GLYCOLAX) 17 g packet Take 17 g by mouth daily as needed.    . prednisoLONE acetate (PRED FORTE) 1 % ophthalmic  suspension Place 1 drop into the left eye 3 (three) times daily.     Marland Kitchen triamcinolone (KENALOG) 0.025 % cream Apply 1 application topically 2 (two) times daily as needed (skin irritation/dry skin.).     Marland Kitchen VENTOLIN HFA 108 (90 Base) MCG/ACT inhaler INHALE 2 PUFFS BY MOUTH EVERY 6 HOURS AS NEEDED FOR SHORTNESS OF BREATH (Patient taking differently: Inhale 2 puffs into the lungs every 6 (six) hours as needed for wheezing or shortness of breath. ) 18 each 0  . Wheat Dextrin (BENEFIBER) POWD Take by mouth. 2 teaspoons by mouth as needed    . zolpidem (AMBIEN) 10  MG tablet Take 10 mg by mouth at bedtime.     Marland Kitchen buPROPion (WELLBUTRIN XL) 150 MG 24 hr tablet Take 150 mg by mouth daily.     . citalopram (CELEXA) 40 MG tablet Take 40 mg by mouth daily.    Marland Kitchen moxifloxacin (VIGAMOX) 0.5 % ophthalmic solution Place 1 drop into the left eye 3 (three) times daily.      No current facility-administered medications for this visit.    Allergies as of 06/20/2019  . (No Known Allergies)    Family History  Problem Relation Age of Onset  . Colon cancer Other   . Breast cancer Mother   . Tuberculosis Mother   . Heart disease Father     Social History   Socioeconomic History  . Marital status: Divorced    Spouse name: Not on file  . Number of children: Not on file  . Years of education: Not on file  . Highest education level: Not on file  Occupational History  . Not on file  Tobacco Use  . Smoking status: Never Smoker  . Smokeless tobacco: Never Used  Substance and Sexual Activity  . Alcohol use: No  . Drug use: No  . Sexual activity: Not Currently    Birth control/protection: None  Other Topics Concern  . Not on file  Social History Narrative   Moved from [facility] 2009. In 2008 when the economy dropped lost job. Reports that after lost job was hard to get another job.  Is not married and does not have children. Used to enjoy horses. Reports that moved here b/c it was a lot cheaper and warmer. Had visited here for a horse event in 1990 so decided to move here. Lives in [place], [place]. Reports that made stupid financial decisions and is dealing with that at this time.    Social Determinants of Health   Financial Resource Strain:   . Difficulty of Paying Living Expenses:   Food Insecurity:   . Worried About Charity fundraiser in the Last Year:   . Arboriculturist in the Last Year:   Transportation Needs:   . Film/video editor (Medical):   Marland Kitchen Lack of Transportation (Non-Medical):   Physical Activity:   . Days of Exercise per Week:   . Minutes  of Exercise per Session:   Stress:   . Feeling of Stress :   Social Connections:   . Frequency of Communication with Friends and Family:   . Frequency of Social Gatherings with Friends and Family:   . Attends Religious Services:   . Active Member of Clubs or Organizations:   . Attends Archivist Meetings:   Marland Kitchen Marital Status:     Review of Systems: Review of Systems  Constitutional: Negative for chills, fever, malaise/fatigue and weight loss.  HENT: Negative for congestion and sore  throat.   Respiratory: Negative for cough and shortness of breath.   Cardiovascular: Negative for chest pain and palpitations.  Gastrointestinal: Positive for constipation. Negative for abdominal pain, blood in stool, diarrhea, melena, nausea and vomiting.       Bloating  Musculoskeletal: Negative for joint pain and myalgias.  Skin: Negative for rash.  Neurological: Negative for dizziness and weakness.  Endo/Heme/Allergies: Does not bruise/bleed easily.  Psychiatric/Behavioral: Negative for depression. The patient is not nervous/anxious.   All other systems reviewed and are negative.   Physical Exam: Note: limited exam due to virtual visit There were no vitals taken for this visit. Physical Exam Nursing note reviewed.  Constitutional:      General: She is not in acute distress.    Comments: No obvious distress heard on phone visit.  HENT:     Nose: No congestion.     Comments: No obvious congestion heard on phone visit Pulmonary:     Effort: No respiratory distress.     Comments: No obvious respiratory distress heard on phone visit Neurological:     General: No focal deficit present.     Mental Status: She is alert and oriented to person, place, and time.     Comments: No obvious speech focal deficit heard on phone visit  Psychiatric:        Attention and Perception: Attention normal.        Mood and Affect: Mood normal.        Speech: Speech normal.        Behavior: Behavior  normal.        Thought Content: Thought content normal.        Cognition and Memory: Cognition and memory normal.

## 2019-06-20 NOTE — Assessment & Plan Note (Signed)
Recently worsened constipation, just restarted Benefiber a couple days ago and MiraLAX yesterday. Had a single bowel movement yesterday (first in a week). Still with incomplete emptying, distension, hard stools.  At this point I recommended she continue the Benefiber once daily and MiraLAX 1-2 times a day for the next week.  Call in 1 week and let us know if it is any better.  If not, we can consider starting an option such as Linzess 72 mcg daily.  Call for any worsening symptoms.  Follow-up in 2 months.

## 2019-06-20 NOTE — Progress Notes (Signed)
PFT done today. 

## 2019-06-20 NOTE — Patient Instructions (Signed)
Your health issues we discussed today were:   Constipation with abdominal distention and bloating: 1. Continue taking Benefiber every day 2. Continue taking MiraLAX once a day, or if needed twice a day 3. Continue these for 1 week 4. Call us in 1 week and let us know if it is helping and if you are doing any better with your constipation 5. If not, we can consider some prescription medicines to see if those work any better 6. Call for any worsening or severe symptoms  Overall I recommend:  1. Continue your other current medications 2. Return for follow-up in 2 months 3. Call us if you have any questions or concerns   ---------------------------------------------------------------  I am glad you have gotten your COVID-19 vaccination!  Even though you are fully vaccinated you should continue to follow CDC and state/local guidelines.  ---------------------------------------------------------------   At [facility] we value your feedback. You may receive a survey about your visit today. Please share your experience as we strive to create trusting relationships with our patients to provide genuine, compassionate, quality care.  We appreciate your understanding and patience as we review any laboratory studies, imaging, and other diagnostic tests that are ordered as we care for you. Our office policy is 5 business days for review of these results, and any emergent or urgent results are addressed in a timely manner for your best interest. If you do not hear from our office in 1 week, please contact us.   We also encourage the use of MyChart, which contains your medical information for your review as well. If you are not enrolled in this feature, an access code is on this after visit summary for your convenience. Thank you for allowing [place] to be involved in your care.  It was great to see you today!  I hope you have a great Summer!!

## 2019-06-21 ENCOUNTER — Encounter: Payer: Self-pay | Admitting: Internal Medicine

## 2019-06-21 ENCOUNTER — Other Ambulatory Visit (HOSPITAL_COMMUNITY): Payer: Medicare HMO

## 2019-06-22 ENCOUNTER — Encounter (HOSPITAL_COMMUNITY)
Admission: RE | Admit: 2019-06-22 | Discharge: 2019-06-22 | Disposition: A | Discharge status: Home / Self Care | Payer: Medicare HMO | Source: Ambulatory Visit | Attending: Ophthalmology | Admitting: Ophthalmology

## 2019-06-22 ENCOUNTER — Encounter (HOSPITAL_COMMUNITY): Payer: Self-pay

## 2019-06-22 ENCOUNTER — Other Ambulatory Visit (HOSPITAL_COMMUNITY)
Admission: RE | Admit: 2019-06-22 | Discharge: 2019-06-22 | Disposition: A | Discharge status: Home / Self Care | Payer: Medicare HMO | Source: Ambulatory Visit | Attending: Ophthalmology | Admitting: Ophthalmology

## 2019-06-22 ENCOUNTER — Other Ambulatory Visit: Payer: Self-pay

## 2019-06-22 DIAGNOSIS — Z01812 Encounter for preprocedural laboratory examination: Secondary | ICD-10-CM | POA: Diagnosis not present

## 2019-06-22 DIAGNOSIS — Z20822 Contact with and (suspected) exposure to covid-19: Secondary | ICD-10-CM | POA: Diagnosis not present

## 2019-06-22 LAB — SARS CORONAVIRUS 2 (TAT 6-24 HRS): SARS Coronavirus 2: NEGATIVE

## 2019-06-25 ENCOUNTER — Ambulatory Visit (HOSPITAL_COMMUNITY): Payer: Medicare HMO | Admitting: Anesthesiology

## 2019-06-25 ENCOUNTER — Other Ambulatory Visit: Payer: Self-pay

## 2019-06-25 ENCOUNTER — Ambulatory Visit (HOSPITAL_COMMUNITY)
Admission: RE | Admit: 2019-06-25 | Discharge: 2019-06-25 | Disposition: A | Discharge status: Home / Self Care | Payer: Medicare HMO | Attending: Ophthalmology | Admitting: Ophthalmology

## 2019-06-25 ENCOUNTER — Encounter (HOSPITAL_COMMUNITY): Payer: Self-pay | Admitting: Ophthalmology

## 2019-06-25 ENCOUNTER — Encounter (HOSPITAL_COMMUNITY)
Admission: RE | Disposition: A | Discharge status: Home / Self Care | Payer: Self-pay | Source: Home / Self Care | Attending: Ophthalmology

## 2019-06-25 DIAGNOSIS — H25811 Combined forms of age-related cataract, right eye: Secondary | ICD-10-CM | POA: Insufficient documentation

## 2019-06-25 DIAGNOSIS — Z79899 Other long term (current) drug therapy: Secondary | ICD-10-CM | POA: Insufficient documentation

## 2019-06-25 DIAGNOSIS — K219 Gastro-esophageal reflux disease without esophagitis: Secondary | ICD-10-CM | POA: Diagnosis not present

## 2019-06-25 DIAGNOSIS — M1990 Unspecified osteoarthritis, unspecified site: Secondary | ICD-10-CM | POA: Diagnosis not present

## 2019-06-25 DIAGNOSIS — F329 Major depressive disorder, single episode, unspecified: Secondary | ICD-10-CM | POA: Diagnosis not present

## 2019-06-25 DIAGNOSIS — R69 Illness, unspecified: Secondary | ICD-10-CM | POA: Diagnosis not present

## 2019-06-25 DIAGNOSIS — J45909 Unspecified asthma, uncomplicated: Secondary | ICD-10-CM | POA: Diagnosis not present

## 2019-06-25 DIAGNOSIS — E7800 Pure hypercholesterolemia, unspecified: Secondary | ICD-10-CM | POA: Insufficient documentation

## 2019-06-25 HISTORY — PX: CATARACT EXTRACTION W/PHACO: SHX586

## 2019-06-25 SURGERY — PHACOEMULSIFICATION, CATARACT, WITH IOL INSERTION
Anesthesia: Monitor Anesthesia Care | Site: Eye | Laterality: Right

## 2019-06-25 MED ORDER — PHENYLEPHRINE HCL 2.5 % OP SOLN
1.0000 [drp] | OPHTHALMIC | Status: AC | PRN
  Administered 2019-06-25 (×3): 1 [drp] via OPHTHALMIC

## 2019-06-25 MED ORDER — EPINEPHRINE PF 1 MG/ML IJ SOLN
INTRAMUSCULAR | Status: AC
  Filled 2019-06-25: qty 2

## 2019-06-25 MED ORDER — ORAL CARE MOUTH RINSE: 15.0000 mL | Freq: Once | OROMUCOSAL | Status: DC

## 2019-06-25 MED ORDER — NEOMYCIN-POLYMYXIN-DEXAMETH 3.5-10000-0.1 OP SUSP
OPHTHALMIC | Status: DC | PRN
  Administered 2019-06-25: 1 [drp] via OPHTHALMIC

## 2019-06-25 MED ORDER — MIDAZOLAM HCL 2 MG/2ML IJ SOLN
INTRAMUSCULAR | Status: AC
  Filled 2019-06-25: qty 2

## 2019-06-25 MED ORDER — EPINEPHRINE PF 1 MG/ML IJ SOLN
INTRAOCULAR | Status: DC | PRN
  Administered 2019-06-25: 1000 mL

## 2019-06-25 MED ORDER — POVIDONE-IODINE 5 % OP SOLN
OPHTHALMIC | Status: DC | PRN
  Administered 2019-06-25: 1 via OPHTHALMIC

## 2019-06-25 MED ORDER — TETRACAINE HCL 0.5 % OP SOLN
1.0000 [drp] | OPHTHALMIC | Status: AC | PRN
  Administered 2019-06-25 (×3): 1 [drp] via OPHTHALMIC

## 2019-06-25 MED ORDER — LIDOCAINE HCL 3.5 % OP GEL: 1.0000 "application " | Freq: Once | OPHTHALMIC | Status: DC

## 2019-06-25 MED ORDER — MIDAZOLAM HCL 2 MG/2ML IJ SOLN
INTRAMUSCULAR | Status: DC | PRN
  Administered 2019-06-25: 2 mg via INTRAVENOUS

## 2019-06-25 MED ORDER — BSS IO SOLN
INTRAOCULAR | Status: DC | PRN
  Administered 2019-06-25: 15 mL via INTRAOCULAR

## 2019-06-25 MED ORDER — LIDOCAINE HCL (PF) 1 % IJ SOLN
INTRAOCULAR | Status: DC | PRN
  Administered 2019-06-25: 1 mL via OPHTHALMIC

## 2019-06-25 MED ORDER — CYCLOPENTOLATE-PHENYLEPHRINE 0.2-1 % OP SOLN
1.0000 [drp] | OPHTHALMIC | Status: AC | PRN
  Administered 2019-06-25 (×3): 1 [drp] via OPHTHALMIC

## 2019-06-25 MED ORDER — PROVISC 10 MG/ML IO SOLN
INTRAOCULAR | Status: DC | PRN
  Administered 2019-06-25: 0.85 mL via INTRAOCULAR

## 2019-06-25 MED ORDER — SODIUM HYALURONATE 23 MG/ML IO SOLN
INTRAOCULAR | Status: DC | PRN
  Administered 2019-06-25: 0.6 mL via INTRAOCULAR

## 2019-06-25 MED ORDER — CHLORHEXIDINE GLUCONATE 0.12 % MT SOLN: 15.0000 mL | Freq: Once | OROMUCOSAL | Status: DC

## 2019-06-25 SURGICAL SUPPLY — 13 items

## 2019-06-25 NOTE — Transfer of Care (Signed)
Immediate Anesthesia Transfer of Care Note  Patient: Amber Bird  Procedure(s) Performed: CATARACT EXTRACTION PHACO AND INTRAOCULAR LENS PLACEMENT RIGHT EYE (Right Eye)  Patient Location: PACU  Anesthesia Type:MAC  Level of Consciousness: awake, alert , oriented and patient cooperative  Airway & Oxygen Therapy: Patient Spontanous Breathing  Post-op Assessment: Report given to RN, Post -op Vital signs reviewed and stable and Patient moving all extremities X 4  Post vital signs: Reviewed and stable  Last Vitals:  Vitals Value Taken Time  BP    Temp    Pulse    Resp    SpO2      Last Pain:  Vitals:   06/25/19 0706  TempSrc: Oral  PainSc: 0-No pain         Complications: No apparent anesthesia complications

## 2019-06-25 NOTE — Discharge Instructions (Signed)
Please discharge patient when stable, will follow up today with Dr. Ethal Gotay at the [facility] office immediately following discharge.  Leave shield in place until visit.  All paperwork with discharge instructions will be given at the office.  [facility] Address:  [address]  [place], [place] [address]             Monitored Anesthesia Care, Care After These instructions provide you with information about caring for yourself after your procedure. Your health care provider may also give you more specific instructions. Your treatment has been planned according to current medical practices, but problems sometimes occur. Call your health care provider if you have any problems or questions after your procedure. What can I expect after the procedure? After your procedure, you may:  Feel sleepy for several hours.  Feel clumsy and have poor balance for several hours.  Feel forgetful about what happened after the procedure.  Have poor judgment for several hours.  Feel nauseous or vomit.  Have a sore throat if you had a breathing tube during the procedure. Follow these instructions at home: For at least 24 hours after the procedure:      Have a responsible adult stay with you. It is important to have someone help care for you until you are awake and alert.  Rest as needed.  Do not: ? Participate in activities in which you could fall or become injured. ? Drive. ? Use heavy machinery. ? Drink alcohol. ? Take sleeping pills or medicines that cause drowsiness. ? Make important decisions or sign legal documents. ? Take care of children on your own. Eating and drinking  Follow the diet that is recommended by your health care provider.  If you vomit, drink water, juice, or soup when you can drink without vomiting.  Make sure you have little or no nausea before eating solid foods. General instructions  Take over-the-counter and  prescription medicines only as told by your health care provider.  If you have sleep apnea, surgery and certain medicines can increase your risk for breathing problems. Follow instructions from your health care provider about wearing your sleep device: ? Anytime you are sleeping, including during daytime naps. ? While taking prescription pain medicines, sleeping medicines, or medicines that make you drowsy.  If you smoke, do not smoke without supervision.  Keep all follow-up visits as told by your health care provider. This is important. Contact a health care provider if:  You keep feeling nauseous or you keep vomiting.  You feel light-headed.  You develop a rash.  You have a fever. Get help right away if:  You have trouble breathing. Summary  For several hours after your procedure, you may feel sleepy and have poor judgment.  Have a responsible adult stay with you for at least 24 hours or until you are awake and alert. This information is not intended to replace advice given to you by your health care provider. Make sure you discuss any questions you have with your health care provider. Document Revised: 04/18/2017 Document Reviewed: 05/11/2015 Elsevier Patient Education  2020 Elsevier Inc.  

## 2019-06-25 NOTE — Anesthesia Postprocedure Evaluation (Signed)
Anesthesia Post Note  Patient: Amber Bird  Procedure(s) Performed: CATARACT EXTRACTION PHACO AND INTRAOCULAR LENS PLACEMENT RIGHT EYE (Right Eye)  Patient location during evaluation: Phase II Anesthesia Type: MAC Level of consciousness: awake, oriented, awake and alert and patient cooperative Pain management: pain level controlled Vital Signs Assessment: post-procedure vital signs reviewed and stable Respiratory status: spontaneous breathing, respiratory function stable and nonlabored ventilation Cardiovascular status: stable Postop Assessment: no apparent nausea or vomiting Anesthetic complications: no     Last Vitals:  Vitals:   06/25/19 0706 06/25/19 0858  BP: (!) 110/57   Pulse: 82 73  Resp: 18 16  Temp: 36.7 C   SpO2: 97% 99%    Last Pain:  Vitals:   06/25/19 0858  TempSrc: Oral  PainSc:                  Willa Rough

## 2019-06-25 NOTE — Op Note (Signed)
Date of procedure: 06/25/19  Pre-operative diagnosis:  Visually significant combined form age-related cataract, Right Eye (H25.811)  Post-operative diagnosis:  Visually significant combined form age-related cataract, Right Eye (H25.811)  Procedure: Removal of cataract via phacoemulsification and insertion of intra-ocular lens [employer] [id number]  +23.0D into the capsular bag of the Right Eye  Attending surgeon: Gerda Diss. Rushi Chasen, MD, MA  Anesthesia: MAC, Topical Akten  Complications: None  Estimated Blood Loss: <16m (minimal)  Specimens: None  Implants: As above  Indications:  Visually significant age-related cataract, Right Eye  Procedure:  The patient was seen and identified in the pre-operative area. The operative eye was identified and dilated.  The operative eye was marked.  Topical anesthesia was administered to the operative eye.     The patient was then to the operative suite and placed in the supine position.  A timeout was performed confirming the patient, procedure to be performed, and all other relevant information.   The patient's face was prepped and draped in the usual fashion for intra-ocular surgery.  A lid speculum was placed into the operative eye and the surgical microscope moved into place and focused.  A superotemporal paracentesis was created using a 20 gauge paracentesis blade.  Shugarcaine was injected into the anterior chamber.  Viscoelastic was injected into the anterior chamber.  A temporal clear-corneal main wound incision was created using a 2.467mmicrokeratome.  A continuous curvilinear capsulorrhexis was initiated using an irrigating cystitome and completed using capsulorrhexis forceps.  Hydrodissection and hydrodeliniation were performed.  Viscoelastic was injected into the anterior chamber.  A phacoemulsification handpiece and a chopper as a second instrument were used to remove the nucleus and epinucleus. The irrigation/aspiration handpiece was  used to remove any remaining cortical material.   The capsular bag was reinflated with viscoelastic, checked, and found to be intact.  The intraocular lens was inserted into the capsular bag.  The irrigation/aspiration handpiece was used to remove any remaining viscoelastic.  The clear corneal wound and paracentesis wounds were then hydrated and checked with Weck-Cels to be watertight.  The lid-speculum was removed.  The drape was removed.  The patient's face was cleaned with a wet and dry 4x4.   Maxitrol was instilled in the eye before a clear shield was taped over the eye. The patient was taken to the post-operative care unit in good condition, having tolerated the procedure well.  Post-Op Instructions: [facility] and will receive all other orders and instructions.

## 2019-06-25 NOTE — Anesthesia Preprocedure Evaluation (Signed)
Anesthesia Evaluation  Patient identified by MRN, date of birth, ID band Patient awake  General Assessment Comment:States BP dropped after c/s  Reviewed: Allergy & Precautions, NPO status , Patient's Chart, lab work & pertinent test results  History of Anesthesia Complications (+) history of anesthetic complications  Airway Mallampati: II  TM Distance: >3 FB Neck ROM: Full    Dental no notable dental hx. (+) Upper Dentures, Edentulous Lower Lower implants:   Pulmonary shortness of breath and with exertion, asthma ,    Pulmonary exam normal breath sounds clear to auscultation       Cardiovascular Exercise Tolerance: Good negative cardio ROS Normal cardiovascular examI Rhythm:Regular Rate:Normal     Neuro/Psych PSYCHIATRIC DISORDERS Depression negative neurological ROS     GI/Hepatic Neg liver ROS, GERD  Medicated and Controlled,Had GERD , lost Weight , now off meds  Denies any GERD Sx today    Endo/Other  negative endocrine ROS  Renal/GU negative Renal ROS  negative genitourinary   Musculoskeletal  (+) Arthritis ,   Abdominal   Peds negative pediatric ROS (+)  Hematology negative hematology ROS (+) anemia ,   Anesthesia Other Findings   Reproductive/Obstetrics negative OB ROS                             Anesthesia Physical  Anesthesia Plan  ASA: III  Anesthesia Plan: MAC   Post-op Pain Management:    Induction: Intravenous  PONV Risk Score and Plan:   Airway Management Planned: Nasal Cannula and Natural Airway  Additional Equipment:   Intra-op Plan:   Post-operative Plan:   Informed Consent: I have reviewed the patients History and Physical, chart, labs and discussed the procedure including the risks, benefits and alternatives for the proposed anesthesia with the patient or authorized representative who has indicated his/her understanding and acceptance.     Dental  advisory given  Plan Discussed with: CRNA and Surgeon  Anesthesia Plan Comments:         Anesthesia Quick Evaluation

## 2019-06-25 NOTE — Interval H&P Note (Signed)
History and Physical Interval Note: The H and P was reviewed and updated. The patient was examined.  No changes were found after exam.  The surgical eye was marked.  06/25/2019 7:51 AM  [name]  has presented today for surgery, with the diagnosis of Nuclear sclerotic cataract - Right eye.  The various methods of treatment have been discussed with the patient and family. After consideration of risks, benefits and other options for treatment, the patient has consented to  Procedure(s) with comments: CATARACT EXTRACTION PHACO AND INTRAOCULAR LENS PLACEMENT (IOC) (Right) - right as a surgical intervention.  The patient's history has been reviewed, patient examined, no change in status, stable for surgery.  I have reviewed the patient's chart and labs.  Questions were answered to the patient's satisfaction.     Amber Bird

## 2019-06-27 ENCOUNTER — Telehealth: Payer: Self-pay | Admitting: *Deleted

## 2019-06-27 NOTE — Telephone Encounter (Signed)
Amber Bird, you are scheduled for a virtual visit with your provider today.  Just as we do with appointments in the office, we must obtain your consent to participate.  Your consent will be active for this visit and any virtual visit you may have with one of our providers in the next 365 days.  If you have a MyChart account, I can also send a copy of this consent to you electronically.  All virtual visits are billed to your insurance company just like a traditional visit in the office.  As this is a virtual visit, video technology does not allow for your provider to perform a traditional examination.  This may limit your provider's ability to fully assess your condition.  If your provider identifies any concerns that need to be evaluated in person or the need to arrange testing such as labs, EKG, etc, we will make arrangements to do so.  Although advances in technology are sophisticated, we cannot ensure that it will always work on either your end or our end.  If the connection with a video visit is poor, we may have to switch to a telephone visit.  With either a video or telephone visit, we are not always able to ensure that we have a secure connection.   I need to obtain your verbal consent now.   Are you willing to proceed with your visit today?

## 2019-06-27 NOTE — Telephone Encounter (Signed)
Pt consented to telephone visit on 06/20/19.

## 2019-07-03 ENCOUNTER — Telehealth: Payer: Self-pay

## 2019-07-03 NOTE — Telephone Encounter (Signed)
Pt called office, requested to cancel OV for 08/21/19. Asked pt if she would like to reschedule appt. Stated she has an appt at [facility] 08/08/19 because she is in pain and needs something sooner.  FYI to CM, pt has appt at [facility].

## 2019-07-03 NOTE — Telephone Encounter (Signed)
Noted  

## 2019-07-06 ENCOUNTER — Telehealth: Payer: Self-pay | Admitting: Pulmonary Disease

## 2019-07-06 NOTE — Telephone Encounter (Signed)
Advised pt of results. Pt understood and nothing further is needed.   

## 2019-07-06 NOTE — Telephone Encounter (Signed)
Labs showed minimal reaction to mold, cat dander and PFTs show minimal changes of airway inflammation We will review CT scan when done and discuss in detail at time of clinic visit

## 2019-07-06 NOTE — Telephone Encounter (Signed)
Patient called asking for results from PFT and labs from 5/14 and 5/19. Patient notified that we will call her as soon as results are available from MD. Dr. Vaughan Browner please advise/send results.

## 2019-07-16 ENCOUNTER — Ambulatory Visit
Admission: RE | Admit: 2019-07-16 | Discharge: 2019-07-16 | Disposition: A | Discharge status: Home / Self Care | Payer: Medicare HMO | Source: Ambulatory Visit | Attending: Pulmonary Disease | Admitting: Pulmonary Disease

## 2019-07-16 DIAGNOSIS — J984 Other disorders of lung: Secondary | ICD-10-CM | POA: Diagnosis not present

## 2019-07-16 DIAGNOSIS — R0602 Shortness of breath: Secondary | ICD-10-CM

## 2019-07-16 DIAGNOSIS — G4452 New daily persistent headache (NDPH): Secondary | ICD-10-CM | POA: Diagnosis not present

## 2019-07-16 DIAGNOSIS — R69 Illness, unspecified: Secondary | ICD-10-CM | POA: Diagnosis not present

## 2019-07-17 ENCOUNTER — Other Ambulatory Visit: Payer: Self-pay | Admitting: Family Medicine

## 2019-07-17 ENCOUNTER — Telehealth: Payer: Self-pay | Admitting: Pulmonary Disease

## 2019-07-17 DIAGNOSIS — G4452 New daily persistent headache (NDPH): Secondary | ICD-10-CM

## 2019-07-17 NOTE — Telephone Encounter (Signed)
Spoke with patient, she is eager to get her CT results from yesterday.  I told her I would route her message to Dr. Vaughan Browner and that after he has a chance to view the CT he will request that one of the clinical staff call her with the results.  She verbalized understanding.

## 2019-07-18 ENCOUNTER — Other Ambulatory Visit (HOSPITAL_COMMUNITY): Payer: Self-pay | Admitting: Family Medicine

## 2019-07-18 ENCOUNTER — Other Ambulatory Visit: Payer: Self-pay | Admitting: Family Medicine

## 2019-07-18 ENCOUNTER — Ambulatory Visit: Payer: Medicare HMO | Admitting: Gastroenterology

## 2019-07-18 DIAGNOSIS — G4452 New daily persistent headache (NDPH): Secondary | ICD-10-CM

## 2019-07-18 NOTE — Telephone Encounter (Signed)
Called and spoke with Patient.  Dr. Mannam's results and recommendations given. Understanding stated. Nothing further at this time.  ?

## 2019-07-18 NOTE — Telephone Encounter (Signed)
CT shows lung findings that are suggestive of sarcoidosis. There is no change since last scan and no new findings. I will discuss in detail at time of return clinic visit later this month.

## 2019-07-30 DIAGNOSIS — Z961 Presence of intraocular lens: Secondary | ICD-10-CM | POA: Diagnosis not present

## 2019-07-31 ENCOUNTER — Other Ambulatory Visit: Payer: Self-pay

## 2019-07-31 ENCOUNTER — Ambulatory Visit (HOSPITAL_COMMUNITY)
Admission: RE | Admit: 2019-07-31 | Discharge: 2019-07-31 | Disposition: A | Discharge status: Home / Self Care | Payer: Medicare HMO | Source: Ambulatory Visit | Attending: Family Medicine | Admitting: Family Medicine

## 2019-07-31 ENCOUNTER — Encounter: Payer: Self-pay | Admitting: Pulmonary Disease

## 2019-07-31 ENCOUNTER — Ambulatory Visit (INDEPENDENT_AMBULATORY_CARE_PROVIDER_SITE_OTHER): Payer: Medicare HMO | Admitting: Pulmonary Disease

## 2019-07-31 VITALS — BP 124/62 | HR 89 | Temp 98.2°F | Ht 63.5 in | Wt 145.8 lb

## 2019-07-31 DIAGNOSIS — G4452 New daily persistent headache (NDPH): Secondary | ICD-10-CM | POA: Insufficient documentation

## 2019-07-31 DIAGNOSIS — R519 Headache, unspecified: Secondary | ICD-10-CM | POA: Diagnosis not present

## 2019-07-31 DIAGNOSIS — D869 Sarcoidosis, unspecified: Secondary | ICD-10-CM | POA: Diagnosis not present

## 2019-07-31 LAB — POCT I-STAT CREATININE: Creatinine, Ser: 0.8 mg/dL (ref 0.44–1.00)

## 2019-07-31 MED ORDER — IOHEXOL 300 MG/ML  SOLN
75.0000 mL | Freq: Once | INTRAMUSCULAR | Status: AC | PRN
  Administered 2019-07-31: 75 mL via INTRAVENOUS

## 2019-07-31 MED ORDER — SODIUM CHLORIDE (PF) 0.9 % IJ SOLN
INTRAMUSCULAR | Status: AC
  Filled 2019-07-31: qty 50

## 2019-07-31 NOTE — Patient Instructions (Addendum)
We will check some labs today and EKG for monitoring sarcoid Follow-up in 6 months.

## 2019-07-31 NOTE — Progress Notes (Signed)
Amber Bird    [id number]    08/28/1945  Primary Care Physician:Hagler, Apolonio Schneiders, MD  Referring Physician: [name], [place],  [place] [id number]  Chief complaint: Follow up for mold exposure, abnormal CT scan  HPI: 74 year old with history of asthma, depression, seasonal allergies Evaluated in January 2019 for abnormal CT scan by Dr. Lake Bells. Patient was concerned for mold exposure since her dog died of lung issues but there is no confirmed mold issues in her home CT scan at that time with findings of perilymphatic nodularity, calcified lymph nodes most suggestive of sarcoidosis.  She is scheduled for bronchoscope but was canceled due to unavailability of transport and patient did not follow-up as she is feeling well.  Since then she has moved to a new home in October 2019.  She has recently found out that she has confirmed mold in crawl spaces of her new home and wants to be reevaluated.  Reports worsening dyspnea over 6 months.  No cough, sputum production, fevers, chills.  Pets: 2 cats, dog.  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  No hot tubs, Jacuzzi's, down pillows or comforters Smoking history: Never smoker Travel history: Originally from [place] and [place].  No significant recent travel Relevant family history: No significant family history of lung disease  Interim history: Here for follow-up of CT scan and labs. States that her breathing is doing better.  No new issues today.  Does complain of intermittent headaches.   Outpatient Encounter Medications as of 07/31/2019  Medication Sig  . atorvastatin (LIPITOR) 20 MG tablet Take 1 tablet (20 mg total) by mouth daily. (Patient taking differently: Take 20 mg by mouth at bedtime. )  . cetirizine (ZYRTEC) 10 MG tablet Take 1 tablet (10 mg total) by mouth daily. (Patient taking differently: Take 10 mg by mouth daily  as needed for allergies. )  . pantoprazole (PROTONIX) 40 MG tablet Take 1 tablet (40 mg total) by mouth daily.  Marland Kitchen triamcinolone (KENALOG) 0.025 % cream Apply 1 application topically 2 (two) times daily as needed (skin irritation/dry skin.).   Marland Kitchen VENTOLIN HFA 108 (90 Base) MCG/ACT inhaler INHALE 2 PUFFS BY MOUTH EVERY 6 HOURS AS NEEDED FOR SHORTNESS OF BREATH (Patient taking differently: Inhale 2 puffs into the lungs every 6 (six) hours as needed for wheezing or shortness of breath. )  . Wheat Dextrin (BENEFIBER) POWD Take by mouth. 2 teaspoons by mouth as needed  . zolpidem (AMBIEN) 10 MG tablet Take 10 mg by mouth at bedtime.   Marland Kitchen buPROPion (WELLBUTRIN XL) 150 MG 24 hr tablet Take 150 mg by mouth daily.  (Patient not taking: Reported on 07/31/2019)  . citalopram (CELEXA) 40 MG tablet Take 40 mg by mouth daily. (Patient not taking: Reported on 07/31/2019)  . diclofenac Sodium (VOLTAREN) 1 % GEL Apply 1 application topically 4 (four) times daily as needed (knee pain.).  (Patient not taking: Reported on 07/31/2019)  . ILEVRO 0.3 % ophthalmic suspension Place 1 drop into the left eye daily.  (Patient not taking: Reported on 07/31/2019)  . moxifloxacin (VIGAMOX) 0.5 % ophthalmic solution Place 1 drop into the left eye 3 (three) times daily.  (Patient not taking: Reported on 07/31/2019)  . polyethylene glycol (MIRALAX / GLYCOLAX) 17 g packet Take 17 g by mouth daily as needed. (Patient not taking: Reported on 07/31/2019)  . prednisoLONE acetate (PRED  FORTE) 1 % ophthalmic suspension Place 1 drop into the left eye 3 (three) times daily.  (Patient not taking: Reported on 07/31/2019)   No facility-administered encounter medications on file as of 07/31/2019.   Physical Exam: Blood pressure 124/62, pulse 89, temperature 98.2 F (36.8 C), temperature source Oral, height 5' 3.5" (1.613 m), weight 145 lb 12.8 oz (66.1 kg), SpO2 98 %. Gen:      No acute distress HEENT:  EOMI, sclera anicteric Neck:     No masses; no  thyromegaly Lungs:    Clear to auscultation bilaterally; normal respiratory effort CV:         Regular rate and rhythm; no murmurs Abd:      + bowel sounds; soft, non-tender; no palpable masses, no distension Ext:    No edema; adequate peripheral perfusion Skin:      Warm and dry; no rash Neuro: alert and oriented x 3 Psych: normal mood and affect  Data Reviewed: Imaging: CT high-resolution 02/23/2017-bilateral pulmonary nodularity in peribronchovascular and subpleural distribution in the upper lobes with calcified mediastinal, hilar lymph nodes.  CT abdomen pelvis 09/07/2018-atelectasis at lung bases.  CT chest 07/16/2019-stable CT chest with findings compatible with sarcoidosis.  Patchy air trapping. I have reviewed the images personally.  PFTs: 02/15/2017 FVC 3.1 [91%], FEV1 2.4 [91%], F/F 76, Normal test  06/20/2019 FVC 2.48 [87%), FEV1 1.99 (93%), F/F 80, TLC 77%, DLCO 87% Minimal obstructive airway disease, mild restriction  Labs: Aspergillus, pigeon antibodies 03/01/2017-negative  Angiotensin-converting enzyme 06/19/2019-24 Hypersensitivity panel 06/19/2019-negative Respiratory allergy profile, mold panel 06/19/2019-mild reaction to Aspergillus, cat dander CBC 06/15/2019-WBC 5.9, eos 1.5%, absolute eosinophil count 88.5  Assessment:  Abnormal CT, mold exposure Presumed sarcoidosis CT scan does not look like hypersensitivity pneumonitis.  It is more consistent with sarcoidosis and has remained stable since 2019 Labs show mild reaction to mold but CT scan is not typical of any mold issues.  Discussed rescheduling bronchoscope but she does not have any transportation and is wary of side effects so decided to hold off Check comprehensive metabolic panel for baseline assessment and EKG She had a recent visit with ophthalmology.  She does complain of mild intermittent headaches but no clear evidence of any neurosarcoid.  We will continue monitoring  closely.  Plan/Recommendations: CMP, EKG Annual ophthalmology monitoring Follow-up in 6 months  Marshell Garfinkel MD [facility] 07/31/2019, 10:45 AM  CC: [name], MD

## 2019-08-07 DIAGNOSIS — H524 Presbyopia: Secondary | ICD-10-CM | POA: Diagnosis not present

## 2019-08-09 ENCOUNTER — Encounter: Payer: Self-pay | Admitting: Neurology

## 2019-08-21 ENCOUNTER — Ambulatory Visit: Payer: Medicare HMO | Admitting: Nurse Practitioner

## 2019-08-23 DIAGNOSIS — G43909 Migraine, unspecified, not intractable, without status migrainosus: Secondary | ICD-10-CM | POA: Insufficient documentation

## 2019-08-27 ENCOUNTER — Telehealth: Payer: Self-pay | Admitting: Internal Medicine

## 2019-08-27 NOTE — Telephone Encounter (Signed)
Fiber supplement can cause abdominal bloating and distension (and subsequent discomfort). Try cutting back to 1 spoonful of Benefiber. Have her come in for iFOBT. Weight loss likely due to inadequate dietary intake. If her iFOBT is positive, can check a CBC. If she's having difficulty with stools can try adding OTC colace 100 mg daily or MiraLAX 17gm daily.  Call if no improvement with these

## 2019-08-27 NOTE — Telephone Encounter (Signed)
Spoke with pt. Pt called with c/o lower abdominal pain and changes in stool. When pt has the first BM, it's normal color and very long per pt. When the second stool comes out, it's black. Pt states she see's no blood but black stool. Pt hasn't taken any iron supplements nor has she taken any Pepto Bismol. Pt  eats salads most days with kidney beans and two heaping table spoons of Benefiber daily. Pt says her lower abdomen is distended and she feels her bowels aren't emptying completely. Pt had an ulcer several years ago and had diarrhea and bleeding with it. Pt has also lost 15 pounds the last few months.

## 2019-08-27 NOTE — Telephone Encounter (Signed)
Pt is aware of her OV with [facility] on 8/27 and has multiple concerns about her bowels. Please call [phone]

## 2019-08-27 NOTE — Telephone Encounter (Signed)
Spoke with pt. Pt is aware of EG recommendations to reduce the Benefiber to 1 spoon daily, add Colace or Miralax on the days pt hasn't had a BM. Pt mentioned that she can't do Miralax, pt advise to use Colace if needed. IFOBT kip is ready for pickup and will be returned. Pt is aware if blood comes up on the IFOBT, she will need lab work completed.

## 2019-08-29 ENCOUNTER — Other Ambulatory Visit: Payer: Self-pay

## 2019-08-29 ENCOUNTER — Ambulatory Visit (INDEPENDENT_AMBULATORY_CARE_PROVIDER_SITE_OTHER): Payer: Medicare HMO | Admitting: Gastroenterology

## 2019-08-29 ENCOUNTER — Encounter: Payer: Self-pay | Admitting: Gastroenterology

## 2019-08-29 DIAGNOSIS — R195 Other fecal abnormalities: Secondary | ICD-10-CM | POA: Diagnosis not present

## 2019-08-29 LAB — IFOBT (OCCULT BLOOD): IFOBT: NEGATIVE

## 2019-09-14 DIAGNOSIS — Z23 Encounter for immunization: Secondary | ICD-10-CM | POA: Diagnosis not present

## 2019-09-14 DIAGNOSIS — K219 Gastro-esophageal reflux disease without esophagitis: Secondary | ICD-10-CM | POA: Diagnosis not present

## 2019-09-14 DIAGNOSIS — G4452 New daily persistent headache (NDPH): Secondary | ICD-10-CM | POA: Diagnosis not present

## 2019-09-14 DIAGNOSIS — G4700 Insomnia, unspecified: Secondary | ICD-10-CM | POA: Diagnosis not present

## 2019-09-14 DIAGNOSIS — E782 Mixed hyperlipidemia: Secondary | ICD-10-CM | POA: Diagnosis not present

## 2019-09-14 DIAGNOSIS — Z7189 Other specified counseling: Secondary | ICD-10-CM | POA: Diagnosis not present

## 2019-09-14 DIAGNOSIS — Z0000 Encounter for general adult medical examination without abnormal findings: Secondary | ICD-10-CM | POA: Diagnosis not present

## 2019-09-17 ENCOUNTER — Other Ambulatory Visit (HOSPITAL_COMMUNITY): Payer: Self-pay | Admitting: Family Medicine

## 2019-09-17 DIAGNOSIS — M8580 Other specified disorders of bone density and structure, unspecified site: Secondary | ICD-10-CM

## 2019-09-18 ENCOUNTER — Telehealth: Payer: Self-pay | Admitting: Internal Medicine

## 2019-09-18 NOTE — Telephone Encounter (Signed)
Lmom waiting on a return call.  

## 2019-09-18 NOTE — Telephone Encounter (Signed)
Please call patient about her medications and issues [phone]

## 2019-09-18 NOTE — Telephone Encounter (Signed)
Patent returned call  Please call back

## 2019-09-18 NOTE — Telephone Encounter (Signed)
Lmom, waiting on a return call.  

## 2019-09-18 NOTE — Telephone Encounter (Signed)
Spoke with pt. Pt is aware that results are negative and any further recommendations will come from [place], [place].

## 2019-09-19 NOTE — Telephone Encounter (Signed)
See iFOBT result note.

## 2019-09-19 NOTE — Telephone Encounter (Signed)
noted 

## 2019-09-27 DIAGNOSIS — H5712 Ocular pain, left eye: Secondary | ICD-10-CM | POA: Diagnosis not present

## 2019-09-27 DIAGNOSIS — H04123 Dry eye syndrome of bilateral lacrimal glands: Secondary | ICD-10-CM | POA: Diagnosis not present

## 2019-09-27 DIAGNOSIS — Z961 Presence of intraocular lens: Secondary | ICD-10-CM | POA: Diagnosis not present

## 2019-09-27 DIAGNOSIS — Z9849 Cataract extraction status, unspecified eye: Secondary | ICD-10-CM | POA: Diagnosis not present

## 2019-09-28 ENCOUNTER — Ambulatory Visit (INDEPENDENT_AMBULATORY_CARE_PROVIDER_SITE_OTHER): Payer: Medicare HMO | Admitting: Internal Medicine

## 2019-09-28 ENCOUNTER — Other Ambulatory Visit: Payer: Self-pay

## 2019-09-28 ENCOUNTER — Encounter: Payer: Self-pay | Admitting: Internal Medicine

## 2019-09-28 VITALS — BP 148/83 | HR 82 | Temp 96.8°F | Ht 63.0 in | Wt 142.0 lb

## 2019-09-28 DIAGNOSIS — K5900 Constipation, unspecified: Secondary | ICD-10-CM | POA: Diagnosis not present

## 2019-09-28 DIAGNOSIS — K219 Gastro-esophageal reflux disease without esophagitis: Secondary | ICD-10-CM | POA: Diagnosis not present

## 2019-09-28 NOTE — Progress Notes (Signed)
Primary Care Physician:  [name], MD Primary Gastroenterologist:  Dr. Gala Romney  Pre-Procedure History & Physical: HPI:  Amber Bird is a 74 y.o. female here for follow-up constipation.  Taking a fiber fortified diet largely in the form of dietary modification.  No supplement.  Taking a stool softener daily.  Really not taking any MiraLAX.  May go 7 to 8 days without a bowel movement.  No bleeding.  Complains of bloating.  Stool recently negative for occult blood.  Has never tried Amitiza or Linzess.  Last screening colonoscopy 03/2015 -negative without plans for future examination No dysphagia.  GERD well-controlled on Protonix 40 mg daily Past Medical History:  Diagnosis Date  . Anemia   . Asthma   . Complication of anesthesia    "drop in blood pressure years ago after hysterectomy "   . Depression   . Dyspnea   . History of bleeding ulcers   . Hypercholesteremia   . Multiple thyroid nodules   . Osteoarthritis of knees, bilateral 05/23/2019  . Seasonal allergies     Past Surgical History:  Procedure Laterality Date  . ABDOMINAL HYSTERECTOMY     complete.   Marland Kitchen BIOPSY  10/02/2018   Procedure: BIOPSY;  Surgeon: Daneil Dolin, MD;  Location: [facility];  Service: Endoscopy;;  gastric  . CATARACT EXTRACTION W/PHACO Left 06/11/2019   Procedure: CATARACT EXTRACTION PHACO AND INTRAOCULAR LENS PLACEMENT (IOC) CDE: 4.63;  Surgeon: Baruch Goldmann, MD;  Location: [facility];  Service: Ophthalmology;  Laterality: Left;  . CATARACT EXTRACTION W/PHACO Right 06/25/2019   Procedure: CATARACT EXTRACTION PHACO AND INTRAOCULAR LENS PLACEMENT RIGHT EYE;  Surgeon: Baruch Goldmann, MD;  Location: [facility];  Service: Ophthalmology;  Laterality: Right;  CDE: 5.61  . CHOLECYSTECTOMY    . COLONOSCOPY N/A 12/18/2015   Amber Bird: diverticulosis in sigmoid colon, otherwise normal   . Cyst removed from ovary  1973  . dental implants    . ESOPHAGOGASTRODUODENOSCOPY (EGD) WITH PROPOFOL N/A 10/02/2018    Procedure: ESOPHAGOGASTRODUODENOSCOPY (EGD) WITH PROPOFOL;  Surgeon: Daneil Dolin, MD;  Location: [facility];  Service: Endoscopy;  Laterality: N/A;  12:45pm  . LAPAROSCOPIC CHOLECYSTECTOMY SINGLE SITE WITH INTRAOPERATIVE CHOLANGIOGRAM N/A 05/12/2016   Procedure: LAPAROSCOPIC CHOLECYSTECTOMY SINGLE SITE WITH INTRAOPERATIVE CHOLANGIOGRAM;  Surgeon: Michael Boston, MD;  Location: [facility];  Service: General;  Laterality: N/A;  . left hammer toe repair    . MALONEY DILATION N/A 10/02/2018   Procedure: Venia Minks DILATION;  Surgeon: Daneil Dolin, MD;  Location: [facility];  Service: Endoscopy;  Laterality: N/A;  . thyroid needle biopsy    . VIDEO BRONCHOSCOPY Bilateral 03/21/2017   Did not have done    Prior to Admission medications   Medication Sig Start Date End Date Taking? Authorizing Provider  atorvastatin (LIPITOR) 20 MG tablet Take 1 tablet (20 mg total) by mouth daily. Patient taking differently: Take 20 mg by mouth at bedtime.  08/17/17  Yes Hagler, Apolonio Schneiders, MD  CALCIUM-MAGNESIUM-ZINC PO Take by mouth 2 (two) times daily.   Yes [provider]  cetirizine (ZYRTEC) 10 MG tablet Take 1 tablet (10 mg total) by mouth daily. Patient taking differently: Take 10 mg by mouth daily as needed for allergies.  03/03/17  Yes Hagler, Apolonio Schneiders, MD  Cholecalciferol (VITAMIN D3) 125 MCG (5000 UT) TABS Take by mouth daily.   Yes [provider]  docusate sodium (COLACE) 100 MG capsule Take 300 mg by mouth as needed for mild constipation.   Yes  [provider]  Omega-3 Fatty Acids (FISH OIL) 1000 MG CAPS Take by mouth daily.   Yes [provider]  pantoprazole (PROTONIX) 40 MG tablet Take 1 tablet (40 mg total) by mouth daily. 12/04/18  Yes Carlis Stable, NP  polyethylene glycol (MIRALAX / GLYCOLAX) 17 g packet Take 17 g by mouth daily as needed.    Yes [provider]  VENTOLIN HFA 108 (90 Base) MCG/ACT inhaler INHALE 2 PUFFS BY MOUTH EVERY 6 HOURS AS NEEDED FOR  SHORTNESS OF BREATH Patient taking differently: Inhale 2 puffs into the lungs every 6 (six) hours as needed for wheezing or shortness of breath.  09/08/17  Yes Hagler, Apolonio Schneiders, MD  zolpidem (AMBIEN) 10 MG tablet Take 10 mg by mouth at bedtime.  08/28/18  Yes [provider]    Allergies as of 09/28/2019  . (No Known Allergies)    Family History  Problem Relation Age of Onset  . Colon cancer Other   . Breast cancer Mother   . Tuberculosis Mother   . Heart disease Father     Social History   Socioeconomic History  . Marital status: Divorced    Spouse name: Not on file  . Number of children: Not on file  . Years of education: Not on file  . Highest education level: Not on file  Occupational History  . Not on file  Tobacco Use  . Smoking status: Never Smoker  . Smokeless tobacco: Never Used  Vaping Use  . Vaping Use: Never used  Substance and Sexual Activity  . Alcohol use: No  . Drug use: No  . Sexual activity: Not Currently    Birth control/protection: None  Other Topics Concern  . Not on file  Social History Narrative   Moved from [facility] [place]. In 2008 when the economy dropped lost job. Reports that after lost job was hard to get another job.  Is not married and does not have children. Used to enjoy horses. Reports that moved here b/c it was a lot cheaper and warmer. Had visited here for a horse event in 1990 so decided to move here. Lives in [place], [place]. Reports that made stupid financial decisions and is dealing with that at this time.    Social Determinants of Health   Financial Resource Strain:   . Difficulty of Paying Living Expenses: Not on file  Food Insecurity:   . Worried About Charity fundraiser in the Last Year: Not on file  . Ran Out of Food in the Last Year: Not on file  Transportation Needs:   . Lack of Transportation (Medical): Not on file  . Lack of Transportation (Non-Medical): Not on file  Physical Activity:   . Days of Exercise per Week: Not  on file  . Minutes of Exercise per Session: Not on file  Stress:   . Feeling of Stress : Not on file  Social Connections:   . Frequency of Communication with Friends and Family: Not on file  . Frequency of Social Gatherings with Friends and Family: Not on file  . Attends Religious Services: Not on file  . Active Member of Clubs or Organizations: Not on file  . Attends Archivist Meetings: Not on file  . Marital Status: Not on file  Intimate Partner Violence:   . Fear of Current or Ex-Partner: Not on file  . Emotionally Abused: Not on file  . Physically Abused: Not on file  . Sexually Abused: Not on file  Review of Systems: See HPI, otherwise negative ROS  Physical Exam: BP (!) 148/83   Pulse 82   Temp (!) 96.8 F (36 C) (Temporal)   Ht 5\' 3"  (1.6 m)   Wt 142 lb (64.4 kg)   BMI 25.15 kg/m  General:   Alert,  Well-developed, well-nourished, pleasant and cooperative in NAD Neck:  Supple; no masses or thyromegaly. No significant cervical adenopathy. Lungs:  Clear throughout to auscultation.   No wheezes, crackles, or rhonchi. No acute distress. Heart:  Regular rate and rhythm; no murmurs, clicks, rubs,  or gallops. Abdomen: Non-distended, normal bowel sounds.  Soft and nontender without appreciable mass or hepatosplenomegaly.  Pulses:  Normal pulses noted. Extremities:  Without clubbing or edema.  Impression/Plan: 74 year old lady with chronic constipation.  Suboptimally managed on fiber and stool softener therapy.  May do very well with the addition of Linzess  GERD well-controlled on Protonix 40 mg daily.  No alarm symptoms.  Recommendations: Continue fiber fortified diet  Continue Stool softener   Add Linzess 72 daily -  Samples x 2 weeks  Call in 2 weeks with a progress report  Stop miralax for now  Continue Protonix 40 mg daily  OV 3 months    Notice: This dictation was prepared with Dragon dictation along with smaller phrase technology. Any  transcriptional errors that result from this process are unintentional and may not be corrected upon review.

## 2019-09-28 NOTE — Patient Instructions (Signed)
Continue fiber fortified diet  Continue Stool softener   Add Linzess 72 daily -  Samples x 2 weeks  Call in 2 weeks with a progress report  Stop miralax for now  Continue Protonix 40 mg daily  OV 3 months

## 2019-10-03 ENCOUNTER — Telehealth: Payer: Self-pay | Admitting: Orthopaedic Surgery

## 2019-10-03 DIAGNOSIS — Z961 Presence of intraocular lens: Secondary | ICD-10-CM | POA: Diagnosis not present

## 2019-10-03 DIAGNOSIS — Z9849 Cataract extraction status, unspecified eye: Secondary | ICD-10-CM | POA: Diagnosis not present

## 2019-10-03 DIAGNOSIS — H04123 Dry eye syndrome of bilateral lacrimal glands: Secondary | ICD-10-CM | POA: Diagnosis not present

## 2019-10-03 DIAGNOSIS — H5712 Ocular pain, left eye: Secondary | ICD-10-CM | POA: Diagnosis not present

## 2019-10-03 NOTE — Telephone Encounter (Signed)
Received vm from pt, wanting to know when last seen in our [place] clinic. Ic pt at number left on vm, just rang and said mailbox full [phone]

## 2019-10-04 ENCOUNTER — Encounter: Payer: Self-pay | Admitting: Internal Medicine

## 2019-10-04 ENCOUNTER — Telehealth: Payer: Self-pay | Admitting: Internal Medicine

## 2019-10-04 NOTE — Telephone Encounter (Signed)
Pt called to give a progress report of the Linzess 72 mcg. Pt is calling before her 2 week trial of Linzess 72 mcg. Pt is taking Pantoprazole daily as directed and stool softeners.  Pt is taking Linzess 72 mcg daily on 09/29/19. Pt had 4-5 BM on Saturday, Sunday and Monday. All stools are formed, pt reports no diarrhea. Pt didn't have a BM on yesterday. Pt reports lower abdominal pain, bloating pt throughout the day until she has a BM. Pt feels when she is moving, pt doesn't have much pain. When pt is sitting, she feels the bloating and abominable pain. Pg states she isn't eating any fatty or greasy foods.

## 2019-10-04 NOTE — Telephone Encounter (Signed)
Please call patient regarding her Linzess. [phone]

## 2019-10-04 NOTE — Telephone Encounter (Signed)
Spoke with pt. Pt is aware of Dr. Coralee North recommendations and will continue Linzess 72 mcg and stool softeners. Samples of Linzess 72 mcg are for pickup.

## 2019-10-04 NOTE — Telephone Encounter (Signed)
I think we are close to where we need to be with her regimen.  Continue Linzess 72 daily and stool softeners.  Hopefully, office visit in the next 2 to 3 months.

## 2019-10-11 ENCOUNTER — Ambulatory Visit (HOSPITAL_COMMUNITY)
Admission: RE | Admit: 2019-10-11 | Discharge: 2019-10-11 | Disposition: A | Discharge status: Home / Self Care | Payer: Medicare HMO | Source: Ambulatory Visit | Attending: Family Medicine | Admitting: Family Medicine

## 2019-10-11 ENCOUNTER — Other Ambulatory Visit: Payer: Self-pay

## 2019-10-11 DIAGNOSIS — Z13820 Encounter for screening for osteoporosis: Secondary | ICD-10-CM | POA: Diagnosis not present

## 2019-10-11 DIAGNOSIS — M8580 Other specified disorders of bone density and structure, unspecified site: Secondary | ICD-10-CM | POA: Diagnosis not present

## 2019-10-11 DIAGNOSIS — Z780 Asymptomatic menopausal state: Secondary | ICD-10-CM | POA: Insufficient documentation

## 2019-10-11 DIAGNOSIS — R29890 Loss of height: Secondary | ICD-10-CM | POA: Diagnosis not present

## 2019-10-23 ENCOUNTER — Telehealth: Payer: Self-pay | Admitting: Internal Medicine

## 2019-10-23 MED ORDER — LINACLOTIDE 72 MCG PO CAPS: 72.0000 ug | ORAL_CAPSULE | Freq: Every day | ORAL | 3 refills | Status: DC

## 2019-10-23 NOTE — Telephone Encounter (Signed)
Pt is requesting RX for Linzess 72 mcg.  Uses [facility].  Routing to RX [facility] Refill Pool.

## 2019-10-23 NOTE — Telephone Encounter (Signed)
I sent in Linzess 72 mcg to take daily 30 minutes before breakfast. #90 with 3 refills.

## 2019-10-23 NOTE — Telephone Encounter (Signed)
I tried to call the patient, no answer  

## 2019-10-23 NOTE — Addendum Note (Signed)
Addended by: Annitta Needs on: 10/23/2019 09:38 AM   Modules accepted: Orders

## 2019-10-23 NOTE — Telephone Encounter (Signed)
Pt has 3 linzess left and wants to know if she needs a prescription or can she get more samples? Please advise [phone]

## 2019-10-23 NOTE — Telephone Encounter (Signed)
Noted.  Pt made aware.  Pt voiced understanding.

## 2019-11-05 ENCOUNTER — Ambulatory Visit: Payer: Medicare HMO | Admitting: Neurology

## 2019-11-19 DIAGNOSIS — Z23 Encounter for immunization: Secondary | ICD-10-CM | POA: Diagnosis not present

## 2019-11-19 DIAGNOSIS — R69 Illness, unspecified: Secondary | ICD-10-CM | POA: Diagnosis not present

## 2019-11-19 DIAGNOSIS — E782 Mixed hyperlipidemia: Secondary | ICD-10-CM | POA: Diagnosis not present

## 2019-11-19 DIAGNOSIS — M810 Age-related osteoporosis without current pathological fracture: Secondary | ICD-10-CM | POA: Diagnosis not present

## 2019-11-30 ENCOUNTER — Other Ambulatory Visit: Payer: Self-pay

## 2019-11-30 ENCOUNTER — Encounter: Payer: Self-pay | Admitting: Internal Medicine

## 2019-11-30 ENCOUNTER — Ambulatory Visit (INDEPENDENT_AMBULATORY_CARE_PROVIDER_SITE_OTHER): Payer: Medicare HMO | Admitting: Internal Medicine

## 2019-11-30 VITALS — BP 116/79 | HR 84 | Temp 97.0°F | Ht 63.0 in | Wt 141.0 lb

## 2019-11-30 DIAGNOSIS — K5900 Constipation, unspecified: Secondary | ICD-10-CM | POA: Diagnosis not present

## 2019-11-30 DIAGNOSIS — K219 Gastro-esophageal reflux disease without esophagitis: Secondary | ICD-10-CM

## 2019-11-30 NOTE — Progress Notes (Signed)
Primary Care Physician:  [name], MD Primary Gastroenterologist:  Dr. Gala Romney  Pre-Procedure History & Physical: HPI:  Amber Bird is a 74 y.o. female here for follow-up of constipation.  Now taking Linzess 72 daily.  She states this is been associated with marked improvement in her constipation.  Having 1 bowel movement daily.  No bleeding.  No abdominal pain.  She is quite happy.  GERD well controlled on Protonix 40 mg daily.  She is up-to-date on screening colonoscopy and a future colonoscopy is not recommended for screening purposes. Past Medical History:  Diagnosis Date  . Anemia   . Asthma   . Complication of anesthesia    "drop in blood pressure years ago after hysterectomy "   . Depression   . Dyspnea   . History of bleeding ulcers   . Hypercholesteremia   . Multiple thyroid nodules   . Osteoarthritis of knees, bilateral 05/23/2019  . Seasonal allergies     Past Surgical History:  Procedure Laterality Date  . ABDOMINAL HYSTERECTOMY     complete.   Marland Kitchen BIOPSY  10/02/2018   Procedure: BIOPSY;  Surgeon: Daneil Dolin, MD;  [facility];  Service: Endoscopy;;  gastric  . CATARACT EXTRACTION W/PHACO Left 06/11/2019   Procedure: CATARACT EXTRACTION PHACO AND INTRAOCULAR LENS PLACEMENT (IOC) CDE: 4.63;  Surgeon: Baruch Goldmann, MD;  Location: [facility];  Service: Ophthalmology;  Laterality: Left;  . CATARACT EXTRACTION W/PHACO Right 06/25/2019   Procedure: CATARACT EXTRACTION PHACO AND INTRAOCULAR LENS PLACEMENT RIGHT EYE;  Surgeon: Baruch Goldmann, MD;  Location: [facility];  Service: Ophthalmology;  Laterality: Right;  CDE: 5.61  . CHOLECYSTECTOMY    . COLONOSCOPY N/A 12/18/2015   Tyshae Stair: diverticulosis in sigmoid colon, otherwise normal   . Cyst removed from ovary  1973  . dental implants    . ESOPHAGOGASTRODUODENOSCOPY (EGD) WITH PROPOFOL N/A 10/02/2018   Procedure: ESOPHAGOGASTRODUODENOSCOPY (EGD) WITH PROPOFOL;  Surgeon: Daneil Dolin, MD;  [facility];  Service: Endoscopy;  Laterality: N/A;  12:45pm  . LAPAROSCOPIC CHOLECYSTECTOMY SINGLE SITE WITH INTRAOPERATIVE CHOLANGIOGRAM N/A 05/12/2016   Procedure: LAPAROSCOPIC CHOLECYSTECTOMY SINGLE SITE WITH INTRAOPERATIVE CHOLANGIOGRAM;  Surgeon: Michael Boston, MD;  Location: [facility];  Service: General;  Laterality: N/A;  . left hammer toe repair    . MALONEY DILATION N/A 10/02/2018   Procedure: Venia Minks DILATION;  Surgeon: Daneil Dolin, MD;  [facility];  Service: Endoscopy;  Laterality: N/A;  . thyroid needle biopsy    . VIDEO BRONCHOSCOPY Bilateral 03/21/2017   Did not have done    Prior to Admission medications   Medication Sig Start Date End Date Taking? Authorizing Provider  atorvastatin (LIPITOR) 20 MG tablet Take 1 tablet (20 mg total) by mouth daily. Patient taking differently: Take 20 mg by mouth at bedtime.  08/17/17  Yes Hagler, Apolonio Schneiders, MD  CALCIUM-MAGNESIUM-ZINC PO Take by mouth 2 (two) times daily.   Yes [provider]  cetirizine (ZYRTEC) 10 MG tablet Take 1 tablet (10 mg total) by mouth daily. Patient taking differently: Take 10 mg by mouth daily as needed for allergies.  03/03/17  Yes Hagler, Apolonio Schneiders, MD  Cholecalciferol (VITAMIN D3) 125 MCG (5000 UT) TABS Take by mouth daily.   Yes [provider]  linaclotide Rolan Lipa) 72 MCG capsule Take 1 capsule (72 mcg total) by mouth daily before breakfast. 10/23/19  Yes Annitta Needs, NP  pantoprazole (PROTONIX) 40 MG tablet Take 1 tablet (40 mg total) by  mouth daily. 12/04/18  Yes Carlis Stable, NP  VENTOLIN HFA 108 (90 Base) MCG/ACT inhaler INHALE 2 PUFFS BY MOUTH EVERY 6 HOURS AS NEEDED FOR SHORTNESS OF BREATH Patient taking differently: Inhale 2 puffs into the lungs every 6 (six) hours as needed for wheezing or shortness of breath.  09/08/17  Yes Hagler, Apolonio Schneiders, MD  zolpidem (AMBIEN) 10 MG tablet Take 10 mg by mouth at bedtime.  08/28/18  Yes [provider]    Allergies as of 11/30/2019  . (No Known  Allergies)    Family History  Problem Relation Age of Onset  . Colon cancer Other   . Breast cancer Mother   . Tuberculosis Mother   . Heart disease Father     Social History   Socioeconomic History  . Marital status: Divorced    Spouse name: Not on file  . Number of children: Not on file  . Years of education: Not on file  . Highest education level: Not on file  Occupational History  . Not on file  Tobacco Use  . Smoking status: Never Smoker  . Smokeless tobacco: Never Used  Vaping Use  . Vaping Use: Never used  Substance and Sexual Activity  . Alcohol use: No  . Drug use: No  . Sexual activity: Not Currently    Birth control/protection: None  Other Topics Concern  . Not on file  Social History Narrative   Moved from [facility] 2009. In 2008 when the economy dropped lost job. Reports that after lost job was hard to get another job.  Is not married and does not have children. Used to enjoy horses. Reports that moved here b/c it was a lot cheaper and warmer. Had visited here for a horse event in 1990 so decided to move here. Lives in [place], [place]. Reports that made stupid financial decisions and is dealing with that at this time.    Social Determinants of Health   Financial Resource Strain:   . Difficulty of Paying Living Expenses: Not on file  Food Insecurity:   . Worried About Charity fundraiser in the Last Year: Not on file  . Ran Out of Food in the Last Year: Not on file  Transportation Needs:   . Lack of Transportation (Medical): Not on file  . Lack of Transportation (Non-Medical): Not on file  Physical Activity:   . Days of Exercise per Week: Not on file  . Minutes of Exercise per Session: Not on file  Stress:   . Feeling of Stress : Not on file  Social Connections:   . Frequency of Communication with Friends and Family: Not on file  . Frequency of Social Gatherings with Friends and Family: Not on file  . Attends Religious Services: Not on file  . Active Member  of Clubs or Organizations: Not on file  . Attends Archivist Meetings: Not on file  . Marital Status: Not on file  Intimate Partner Violence:   . Fear of Current or Ex-Partner: Not on file  . Emotionally Abused: Not on file  . Physically Abused: Not on file  . Sexually Abused: Not on file    Review of Systems: See HPI, otherwise negative ROS  Physical Exam: BP 116/79   Pulse 84   Temp (!) 97 F (36.1 C) (Temporal)   Ht 5\' 3"  (1.6 m)   Wt 141 lb (64 kg)   BMI 24.98 kg/m  General:   Alert,   pleasant and cooperative in  NAD Neck:  Supple; no masses or thyromegaly. No significant cervical adenopathy. Lungs:  Clear throughout to auscultation.   No wheezes, crackles, or rhonchi. No acute distress. Heart:  Regular rate and rhythm; no murmurs, clicks, rubs,  or gallops. Abdomen: Non-distended, normal bowel sounds.  Soft and nontender without appreciable mass or hepatosplenomegaly.  Pulses:  Normal pulses noted. Extremities:  Without clubbing or edema.  Impression/Plan: 74 year old lady with chronic constipation now markedly improved on low-dose Linzess.  GERD well controlled on Protonix 40 mg daily.  She is very pleased with her progress.  Recommendations:  Continue protonix 40 mg daily  Continue Linzess 72 daily  No future colonoscopy unless new symptoms develop  OV here in 1 year  Notice: This dictation was prepared with Dragon dictation along with smaller phrase technology. Any transcriptional errors that result from this process are unintentional and may not be corrected upon review.

## 2019-11-30 NOTE — Patient Instructions (Signed)
Continue protonix 40 mg daily  Continue Linzess 72 daily  No future colonoscopy unless new symptoms develop  OV here in 1 year

## 2020-01-07 DIAGNOSIS — H01002 Unspecified blepharitis right lower eyelid: Secondary | ICD-10-CM | POA: Diagnosis not present

## 2020-01-07 DIAGNOSIS — H26491 Other secondary cataract, right eye: Secondary | ICD-10-CM | POA: Diagnosis not present

## 2020-01-07 DIAGNOSIS — H26493 Other secondary cataract, bilateral: Secondary | ICD-10-CM | POA: Diagnosis not present

## 2020-01-07 DIAGNOSIS — H01001 Unspecified blepharitis right upper eyelid: Secondary | ICD-10-CM | POA: Diagnosis not present

## 2020-01-07 DIAGNOSIS — Z961 Presence of intraocular lens: Secondary | ICD-10-CM | POA: Diagnosis not present

## 2020-01-21 DIAGNOSIS — H26492 Other secondary cataract, left eye: Secondary | ICD-10-CM | POA: Diagnosis not present

## 2020-02-07 DIAGNOSIS — Z23 Encounter for immunization: Secondary | ICD-10-CM | POA: Diagnosis not present

## 2020-06-27 ENCOUNTER — Other Ambulatory Visit: Payer: Self-pay

## 2020-06-27 ENCOUNTER — Other Ambulatory Visit: Payer: Self-pay | Admitting: Family Medicine

## 2020-06-27 ENCOUNTER — Ambulatory Visit
Admission: RE | Admit: 2020-06-27 | Discharge: 2020-06-27 | Disposition: A | Discharge status: Home / Self Care | Payer: Medicare HMO | Source: Ambulatory Visit | Attending: Family Medicine | Admitting: Family Medicine

## 2020-06-27 DIAGNOSIS — R079 Chest pain, unspecified: Secondary | ICD-10-CM

## 2020-06-27 DIAGNOSIS — R011 Cardiac murmur, unspecified: Secondary | ICD-10-CM | POA: Diagnosis not present

## 2020-07-07 ENCOUNTER — Other Ambulatory Visit: Payer: Self-pay

## 2020-07-07 ENCOUNTER — Ambulatory Visit (INDEPENDENT_AMBULATORY_CARE_PROVIDER_SITE_OTHER): Payer: Medicare HMO | Admitting: Internal Medicine

## 2020-07-07 ENCOUNTER — Encounter: Payer: Self-pay | Admitting: *Deleted

## 2020-07-07 ENCOUNTER — Encounter: Payer: Self-pay | Admitting: Internal Medicine

## 2020-07-07 VITALS — BP 110/80 | HR 83 | Ht 63.5 in | Wt 145.0 lb

## 2020-07-07 DIAGNOSIS — I341 Nonrheumatic mitral (valve) prolapse: Secondary | ICD-10-CM | POA: Diagnosis not present

## 2020-07-07 DIAGNOSIS — R079 Chest pain, unspecified: Secondary | ICD-10-CM

## 2020-07-07 NOTE — Progress Notes (Signed)
Cardiology Office Note   Date:  07/07/2020   ID:  Amber Bird, DOB [birth year], MRN [id number]  PCP:  [name], MD  Cardiologist:   [name], MD   Patient is referred for cardiac evaluation with a history of chest pain.    History of Present Illness: Amber Bird is a 75 y.o. female who on 5/21 developed chest pain.  She said she was watching TV at about 7 or 8   in the evening when she developed a left sided sharp pain.  The discomfort was associated with some left jaw pain.  The pt  took 2 aspirin and the discomfort went away over a few minutes.  Later that night, a couple hours later  she again had a sharp pain; left sided, not.  Again took 2 aspirin.  She has not had any episodes since.  The patient does note some dyspnea with activity.  She used to walk about 2 miles.  She says she gets som shortness of breath with this . SHe denies CP with activity    Pt used to walk 2 miles per day    Lives in town house     Has a dog   Has some SOB with walking up hills   Current Meds  Medication Sig  . atorvastatin (LIPITOR) 20 MG tablet Take 1 tablet (20 mg total) by mouth daily. (Patient taking differently: Take 20 mg by mouth at bedtime.)  . CALCIUM-MAGNESIUM-ZINC PO Take by mouth 2 (two) times daily.  . cetirizine (ZYRTEC) 10 MG tablet Take 1 tablet (10 mg total) by mouth daily. (Patient taking differently: Take 10 mg by mouth daily as needed for allergies.)  . Cholecalciferol (VITAMIN D3) 125 MCG (5000 UT) TABS Take by mouth daily.  Marland Kitchen linaclotide (LINZESS) 72 MCG capsule Take 1 capsule (72 mcg total) by mouth daily before breakfast.  . pantoprazole (PROTONIX) 40 MG tablet Take 1 tablet (40 mg total) by mouth daily.  . VENTOLIN HFA 108 (90 Base) MCG/ACT inhaler INHALE 2 PUFFS BY MOUTH EVERY 6 HOURS AS NEEDED FOR SHORTNESS OF BREATH (Patient taking differently: Inhale 2 puffs into the lungs every 6 (six) hours as needed for wheezing or shortness of breath.)  . zolpidem  (AMBIEN) 10 MG tablet Take 10 mg by mouth at bedtime. 0.5 tablet nightly     Allergies:   Patient has no known allergies.   Past Medical History:  Diagnosis Date  . Anemia   . Asthma   . Complication of anesthesia    "drop in blood pressure years ago after hysterectomy "   . Depression   . Dyspnea   . History of bleeding ulcers   . Hypercholesteremia   . Multiple thyroid nodules   . Osteoarthritis of knees, bilateral 05/23/2019  . Seasonal allergies     Past Surgical History:  Procedure Laterality Date  . ABDOMINAL HYSTERECTOMY     complete.   Marland Kitchen BIOPSY  10/02/2018   Procedure: BIOPSY;  Surgeon: Daneil Dolin, MD;  [facility];  Service: Endoscopy;;  gastric  . CATARACT EXTRACTION W/PHACO Left 06/11/2019   Procedure: CATARACT EXTRACTION PHACO AND INTRAOCULAR LENS PLACEMENT (IOC) CDE: 4.63;  Surgeon: Baruch Goldmann, MD;  [facility]: [facility];  Service: Ophthalmology;  Laterality: Left;  . CATARACT EXTRACTION W/PHACO Right 06/25/2019   Procedure: CATARACT EXTRACTION PHACO AND INTRAOCULAR LENS PLACEMENT RIGHT EYE;  Surgeon: Baruch Goldmann, MD;  [facility]: [facility];  Service: Ophthalmology;  Laterality: Right;  CDE:  5.61  . CHOLECYSTECTOMY    . COLONOSCOPY N/A 12/18/2015   Rourk: diverticulosis in sigmoid colon, otherwise normal   . Cyst removed from ovary  1973  . dental implants    . ESOPHAGOGASTRODUODENOSCOPY (EGD) WITH PROPOFOL N/A 10/02/2018   Procedure: ESOPHAGOGASTRODUODENOSCOPY (EGD) WITH PROPOFOL;  Surgeon: Daneil Dolin, MD;  [facility];  Service: Endoscopy;  Laterality: N/A;  12:45pm  . LAPAROSCOPIC CHOLECYSTECTOMY SINGLE SITE WITH INTRAOPERATIVE CHOLANGIOGRAM N/A 05/12/2016   Procedure: LAPAROSCOPIC CHOLECYSTECTOMY SINGLE SITE WITH INTRAOPERATIVE CHOLANGIOGRAM;  Surgeon: Michael Boston, MD;  [facility];  Service: General;  Laterality: N/A;  . left hammer toe repair    . MALONEY DILATION N/A 10/02/2018   Procedure: Venia Minks DILATION;  Surgeon: Daneil Dolin, MD;  [facility];  Service: Endoscopy;  Laterality: N/A;  . thyroid needle biopsy    . VIDEO BRONCHOSCOPY Bilateral 03/21/2017   Did not have done     Social History:  The patient  reports that she has never smoked. She has never used smokeless tobacco. She reports that she does not drink alcohol and does not use drugs.   Family History:  The patient's family history includes Breast cancer in her mother; Colon cancer in an other family member; Heart disease in her father; Tuberculosis in her mother.    ROS:  Please see the history of present illness. All other systems are reviewed and  Negative to the above problem except as noted.    PHYSICAL EXAM: VS:  BP 110/80 (BP Location: Left Arm)   Pulse 83   Ht 5' 3.5" (1.613 m)   Wt 145 lb (65.8 kg)   SpO2 97%   BMI 25.28 kg/m   GEN: Well nourished, well developed, in no acute distress  HEENT: normal  Neck: no JVD, no carotid bruit Cardiac: RRR; grade 3/6 mid systolic murmur left sternal border to base.  No lower extremity edema  Respiratory:  clear to auscultation bilaterally GI: soft, nontender, nondistended, + BS  No hepatomegaly  MS: no deformity Moving all extremities   Skin: warm and dry, no rash Neuro:  Strength and sensation are intact Psych: euthymic mood, full affect   EKG:  EKG is ordered today.  Sinus rhythm 83 bpm  Echo:  03/07/17   - Left ventricle: The cavity size was normal. There was mild  concentric hypertrophy. Systolic function was normal. The  estimated ejection fraction was in the range of 60% to 65%. Wall  motion was normal; there were no regional wall motion  abnormalities. Doppler parameters are consistent with abnormal  left ventricular relaxation (grade 1 diastolic dysfunction).  Indeterminate ventricular filling pressure by Doppler parameters.  - Mitral valve: Moderate, late systolicprolapse, involving the  anterior leaflet and the posterior leaflet. There was  moderate  regurgitation directed centrally.  - Left atrium: The atrium was mildly dilated.   CT 07/16/19   IMPRESSION: 1. Stable chest CT. Spectrum of pulmonary parenchymal findings most compatible with sarcoidosis, including upper lung predominant perilymphatic distribution nodularity and mild fibrosis. 2. Stable mild patchy air trapping in both lungs, indicative of small airways disease, commonly seen with sarcoidosis. 3. Stable dilated main pulmonary artery, suggesting chronic pulmonary arterial hypertension. 4. Stable calcified mediastinal and bilateral hilar adenopathy, compatible with sarcoidosis. 5. Stable heterogeneous 2.6 cm left thyroid nodule. Recommend thyroid US (ref: J Am Coll Radiol. 2015 Feb;12(2): 143-50). 6. One vessel coronary atherosclerosis. 7. Aortic Atherosclerosis (ICD10-I70.0).  Lipid Panel    Component Value Date/Time  CHOL 154 06/13/2017 1123   TRIG 139 06/13/2017 1123   HDL 54 06/13/2017 1123   CHOLHDL 2.9 06/13/2017 1123   LDLCALC 76 06/13/2017 1123      Wt Readings from Last 3 Encounters:  07/07/20 145 lb (65.8 kg)  11/30/19 141 lb (64 kg)  09/28/19 142 lb (64.4 kg)      ASSESSMENT AND PLAN:  1.  Chest pain.  The  episode of chest pain couple weeks ago seems somewhat atypical.  She has not had any since.  That would go against coronary artery disease.  .   CT scan from 2021 shows indeed one-vessel disease of the LAD.  I reviewed this with her; calcification is in the mid distal LAD.  She does have some dyspnea with activity.  She also has a murmur ago with mitral regurgitation on echo done a few years ago  I would recommend an echocardiogram to reevaluate LV function and the mitral valve.  If the LVEF is normal.  I would set the patient up for stress Myoview to evaluate for inducible ischemia if the LVEF is down, would recommend cardiac catheterization..  Otherwise I would continue to follow.  2.  Mitral valve disease.  Murmurpresent   on exam today.  Previous echo shows MVP.  Will get another echo to reevaluate.  If indeed it is not progressed significantly would  follow with periodic echoes.  3 dyslipidemia would keep on statin given CT findings.  Further follow-up based on test results.   Current medicines are reviewed at length with the patient today.  The patient does not have concerns regarding medicines.  Signed, [name], MD  07/07/2020 10:10 AM    [address], [place], [place]  [address] Phone: [phone]; Fax: [phone]

## 2020-07-07 NOTE — Patient Instructions (Signed)
Medication Instructions:  Your physician recommends that you continue on your current medications as directed. Please refer to the Current Medication list given to you today.  *If you need a refill on your cardiac medications before your next appointment, please call your pharmacy*   Lab Work: NONE   If you have labs (blood work) drawn today and your tests are completely normal, you will receive your results only by: Marland Kitchen MyChart Message (if you have MyChart) OR . A paper copy in the mail If you have any lab test that is abnormal or we need to change your treatment, we will call you to review the results.   Testing/Procedures: Your physician has requested that you have an echocardiogram. Echocardiography is a painless test that uses sound waves to create images of your heart. It provides your doctor with information about the size and shape of your heart and how well your heart's chambers and valves are working. This procedure takes approximately one hour. There are no restrictions for this procedure.   Follow-Up: At [facility], you and your health needs are our priority.  As part of our continuing mission to provide you with exceptional heart care, we have created designated Provider Care Teams.  These Care Teams include your primary Cardiologist (physician) and Advanced Practice Providers (APPs -  Physician Assistants and Nurse Practitioners) who all work together to provide you with the care you need, when you need it.  We recommend signing up for the patient portal called "MyChart".  Sign up information is provided on this After Visit Summary.  MyChart is used to connect with patients for Virtual Visits (Telemedicine).  Patients are able to view lab/test results, encounter notes, upcoming appointments, etc.  Non-urgent messages can be sent to your provider as well.   To learn more about what you can do with MyChart, go to [email or web address].    Your next appointment:    Pending  test results   The format for your next appointment:   In Person  Provider:   [name], MD   Other Instructions Thank you for choosing [facility]!

## 2020-07-22 ENCOUNTER — Ambulatory Visit (HOSPITAL_COMMUNITY)
Admission: RE | Admit: 2020-07-22 | Discharge: 2020-07-22 | Disposition: A | Discharge status: Home / Self Care | Payer: Medicare HMO | Source: Ambulatory Visit | Attending: Internal Medicine | Admitting: Internal Medicine

## 2020-07-22 ENCOUNTER — Other Ambulatory Visit: Payer: Self-pay

## 2020-07-22 DIAGNOSIS — I341 Nonrheumatic mitral (valve) prolapse: Secondary | ICD-10-CM | POA: Diagnosis not present

## 2020-07-22 LAB — ECHOCARDIOGRAM COMPLETE
AR max vel: 2.11 cm2
AV Area VTI: 2.05 cm2
AV Area mean vel: 2.19 cm2
AV Mean grad: 2.8 mmHg
AV Peak grad: 6.3 mmHg
Ao pk vel: 1.25 m/s
Area-P 1/2: 3.27 cm2
MV VTI: 1.39 cm2
S' Lateral: 2.5 cm

## 2020-07-22 NOTE — Progress Notes (Signed)
*  PRELIMINARY RESULTS* Echocardiogram 2D Echocardiogram has been performed.  Amber Bird 07/22/2020, 11:30 AM

## 2020-08-05 DIAGNOSIS — Z1231 Encounter for screening mammogram for malignant neoplasm of breast: Secondary | ICD-10-CM | POA: Diagnosis not present

## 2020-08-20 ENCOUNTER — Telehealth: Payer: Self-pay | Admitting: Internal Medicine

## 2020-08-20 NOTE — Telephone Encounter (Signed)
Will forward to Dr.Ross to result echo.

## 2020-08-20 NOTE — Telephone Encounter (Signed)
New message     Patient had echo done on 07/22/20 and has not gotten the results back , please call

## 2020-08-22 ENCOUNTER — Telehealth: Payer: Self-pay | Admitting: Internal Medicine

## 2020-08-22 NOTE — Telephone Encounter (Signed)
Records printed for 08/25/20 apt

## 2020-08-22 NOTE — Telephone Encounter (Signed)
Dr Harrington Challenger spoke with patient by phone and would like to see her 08/25/20 at 3 pm in office to discuss results.

## 2020-08-22 NOTE — Telephone Encounter (Signed)
New message    Patient called she wants a written copy of the Echo and the results and Dr Harrington Challenger office notes , she does not want to see them on the computer she wants a paper copy.

## 2020-08-25 ENCOUNTER — Other Ambulatory Visit: Payer: Self-pay

## 2020-08-25 ENCOUNTER — Other Ambulatory Visit (HOSPITAL_COMMUNITY)
Admission: RE | Admit: 2020-08-25 | Discharge: 2020-08-25 | Disposition: A | Discharge status: Home / Self Care | Payer: Medicare HMO | Source: Ambulatory Visit | Attending: Internal Medicine | Admitting: Internal Medicine

## 2020-08-25 ENCOUNTER — Encounter: Payer: Self-pay | Admitting: Internal Medicine

## 2020-08-25 ENCOUNTER — Ambulatory Visit (INDEPENDENT_AMBULATORY_CARE_PROVIDER_SITE_OTHER): Payer: Medicare HMO | Admitting: Internal Medicine

## 2020-08-25 VITALS — BP 110/70 | HR 64 | Ht 63.5 in | Wt 143.0 lb

## 2020-08-25 DIAGNOSIS — R079 Chest pain, unspecified: Secondary | ICD-10-CM

## 2020-08-25 LAB — CBC
HCT: 42.2 % (ref 36.0–46.0)
Hemoglobin: 13.8 g/dL (ref 12.0–15.0)
MCH: 30.8 pg (ref 26.0–34.0)
MCHC: 32.7 g/dL (ref 30.0–36.0)
MCV: 94.2 fL (ref 80.0–100.0)
Platelets: 255 10*3/uL (ref 150–400)
RBC: 4.48 MIL/uL (ref 3.87–5.11)
RDW: 13.2 % (ref 11.5–15.5)
WBC: 6.2 10*3/uL (ref 4.0–10.5)
nRBC: 0 % (ref 0.0–0.2)

## 2020-08-25 LAB — BASIC METABOLIC PANEL
Anion gap: 6 (ref 5–15)
BUN: 13 mg/dL (ref 8–23)
CO2: 28 mmol/L (ref 22–32)
Calcium: 9.4 mg/dL (ref 8.9–10.3)
Chloride: 105 mmol/L (ref 98–111)
Creatinine, Ser: 0.88 mg/dL (ref 0.44–1.00)
GFR, Estimated: >60 mL/min (ref >60)
Glucose, Bld: 113 mg/dL — ABNORMAL HIGH (ref 70–99)
Potassium: 3.8 mmol/L (ref 3.5–5.1)
Sodium: 139 mmol/L (ref 135–145)

## 2020-08-25 NOTE — Progress Notes (Signed)
Cardiology Office Note   Date:  08/25/2020   ID:  Amber Bird, DOB 14-Apr-1945, MRN [id number]  PCP:  [name], MD  Cardiologist:   [name], MD   Pateint presents to review / discuss echo findings      History of Present Illness: Amber Bird is a 75 y.o. female with a history of  MVP and mitral regurgitaiton.  Pt also with hx remote chest pain   Pain was sharp  L sided.  Occurred on 06/21/20   None since    I saw the pt in JUne 2022 for the first time    She complained of some dyspnea on exertion  She says it started last November 2021   Used to walk a lot   Got SOB now with hills   Echo in late June showed MVP with significant mitral regurgitation   (see below)    LVEF normal     Since seen she says she feels about the same   Remains very active but says she used to be able to do more    Has some DOE Deneis PND   No orthopnea   No palpitaitons   No further CP    Lives alone.  Pets   No relatives        Current Meds  Medication Sig   atorvastatin (LIPITOR) 20 MG tablet Take 1 tablet (20 mg total) by mouth daily. (Patient taking differently: Take 20 mg by mouth at bedtime.)   CALCIUM-MAGNESIUM-ZINC PO Take by mouth 2 (two) times daily.   cetirizine (ZYRTEC) 10 MG tablet Take 1 tablet (10 mg total) by mouth daily. (Patient taking differently: Take 10 mg by mouth daily as needed for allergies.)   Cholecalciferol (VITAMIN D3) 125 MCG (5000 UT) TABS Take by mouth daily.   linaclotide (LINZESS) 72 MCG capsule Take 1 capsule (72 mcg total) by mouth daily before breakfast.   pantoprazole (PROTONIX) 40 MG tablet Take 1 tablet (40 mg total) by mouth daily.   VENTOLIN HFA 108 (90 Base) MCG/ACT inhaler INHALE 2 PUFFS BY MOUTH EVERY 6 HOURS AS NEEDED FOR SHORTNESS OF BREATH (Patient taking differently: Inhale 2 puffs into the lungs every 6 (six) hours as needed for wheezing or shortness of breath.)   zolpidem (AMBIEN) 10 MG tablet Take 10 mg by mouth at bedtime. 0.5 tablet  nightly     Allergies:   Patient has no known allergies.   Past Medical History:  Diagnosis Date   Anemia    Asthma    Complication of anesthesia    "drop in blood pressure years ago after hysterectomy "    Depression    Dyspnea    History of bleeding ulcers    Hypercholesteremia    Multiple thyroid nodules    Osteoarthritis of knees, bilateral 05/23/2019   Seasonal allergies     Past Surgical History:  Procedure Laterality Date   ABDOMINAL HYSTERECTOMY     complete.    BIOPSY  10/02/2018   Procedure: BIOPSY;  Surgeon: Daneil Dolin, MD;  [facility];  Service: Endoscopy;;  gastric   CATARACT EXTRACTION W/PHACO Left 06/11/2019   Procedure: CATARACT EXTRACTION PHACO AND INTRAOCULAR LENS PLACEMENT (IOC) CDE: 4.63;  Surgeon: Baruch Goldmann, MD;  Location: [facility];  Service: Ophthalmology;  Laterality: Left;   CATARACT EXTRACTION W/PHACO Right 06/25/2019   Procedure: CATARACT EXTRACTION PHACO AND INTRAOCULAR LENS PLACEMENT RIGHT EYE;  Surgeon: Baruch Goldmann, MD;  Location: [facility];  Service: Ophthalmology;  Laterality: Right;  CDE: 5.61   CHOLECYSTECTOMY     COLONOSCOPY N/A 12/18/2015   Rourk: diverticulosis in sigmoid colon, otherwise normal    Cyst removed from ovary  1973   dental implants     ESOPHAGOGASTRODUODENOSCOPY (EGD) WITH PROPOFOL N/A 10/02/2018   Procedure: ESOPHAGOGASTRODUODENOSCOPY (EGD) WITH PROPOFOL;  Surgeon: Daneil Dolin, MD;  [facility];  Service: Endoscopy;  Laterality: N/A;  12:45pm   LAPAROSCOPIC CHOLECYSTECTOMY SINGLE SITE WITH INTRAOPERATIVE CHOLANGIOGRAM N/A 05/12/2016   Procedure: LAPAROSCOPIC CHOLECYSTECTOMY SINGLE SITE WITH INTRAOPERATIVE CHOLANGIOGRAM;  Surgeon: Michael Boston, MD;  [facility];  Service: General;  Laterality: N/A;   left hammer toe repair     MALONEY DILATION N/A 10/02/2018   Procedure: Venia Minks DILATION;  Surgeon: Daneil Dolin, MD;  [facility];  Service: Endoscopy;  Laterality: N/A;    thyroid needle biopsy     VIDEO BRONCHOSCOPY Bilateral 03/21/2017   Did not have done     Social History:  The patient  reports that she has never smoked. She has never used smokeless tobacco. She reports that she does not drink alcohol and does not use drugs.   Family History:  The patient's family history includes Breast cancer in her mother; Colon cancer in an other family member; Heart disease in her father; Tuberculosis in her mother.    ROS:  Please see the history of present illness. All other systems are reviewed and  Negative to the above problem except as noted.    PHYSICAL EXAM: VS:  BP 110/70   Pulse 64   Ht 5' 3.5" (1.613 m)   Wt 143 lb (64.9 kg)   SpO2 96%   BMI 24.93 kg/m   GEN: Well nourished, well developed, in no acute distress  HEENT: normal  Neck: no JVD, carotid bruits Cardiac: RRR;  Gr III/VI systolic murmurLSB  No LE  edema  Respiratory:  clear to auscultation bilaterally,  GI: soft, nontender, nondistended, + BS  No hepatomegaly  MS: no deformity Moving all extremities   Skin: warm and dry, no rash Neuro:  Strength and sensation are intact Psych: euthymic mood, full affect   EKG:  EKG is Not ordered today.  Echo   07/22/20  1. Left ventricular ejection fraction, by estimation, is 60 to 65%. The left ventricle has normal function. The left ventricle demonstrates regional wall motion abnormalities (see scoring diagram/findings for description). There is mild left ventricular hypertrophy. Left ventricular diastolic parameters were normal. Mild anteoseptal wall hypokinesis. 2. Right ventricular systolic function is normal. The right ventricular size is normal. There is normal pulmonary artery systolic pressure. 3. Left atrial size was severely dilated. 4. Right atrial size was moderately dilated. 5. Posterior leaflet prolapse resulting in eccentric anterior mitral regurgitation. The eccentricity makes it difficult to asses severity. MV to AV VTI ratio  of 1.4 would suggest moderate to severe MR. Consider TEE. . The mitral valve is abnormal. Moderate to severe mitral valve regurgitation. 6. The aortic valve is tricuspid. Aortic valve regurgitation is not visualized. No aortic stenosis is present. 7. The inferior vena cava is normal in size with greater than 50% respiratory variability, suggesting right atrial pressure of 3 mmHg. Lipid Panel    Component Value Date/Time   CHOL 154 06/13/2017 1123   TRIG 139 06/13/2017 1123   HDL 54 06/13/2017 1123   CHOLHDL 2.9 06/13/2017 1123   LDLCALC 76 06/13/2017 1123      Wt Readings from Last  3 Encounters:  08/25/20 143 lb (64.9 kg)  07/07/20 145 lb (65.8 kg)  11/30/19 141 lb (64 kg)      ASSESSMENT AND PLAN:  1  Mitral regurgitation   I have spent time reviewing pathophysicology of MV regurgitation   I think she is symtpomatic from it   I would recomm a TEE to evaluate / define valve further   I would also plan of L heart caht to r/o coincident CAD    Further plans based on these results  Risks/ benefits of TEE discussed  Patient understands and agrees to proceed.   Get CBC and BMET today for this and cath   Current medicines are reviewed at length with the patient today.  The patient does not have concerns regarding medicines.  Signed, [name], MD  08/25/2020 3:48 PM    [address], [place], [place]  [address] Phone: [phone]; Fax: [phone]

## 2020-08-25 NOTE — H&P (View-Only) (Signed)
Cardiology Office Note   Date:  08/25/2020   ID:  Amber Bird, DOB 04-Mar-1945, MRN [id number]  PCP:  [name], MD  Cardiologist:   [name], MD   Pateint presents to review / discuss echo findings      History of Present Illness: Amber Bird is a 74 y.o. female with a history of  MVP and mitral regurgitaiton.  Pt also with hx remote chest pain   Pain was sharp  L sided.  Occurred on 06/21/20   None since    I saw the pt in JUne 2022 for the first time    She complained of some dyspnea on exertion  She says it started last November 2021   Used to walk a lot   Got SOB now with hills   Echo in late June showed MVP with significant mitral regurgitation   (see below)    LVEF normal     Since seen she says she feels about the same   Remains very active but says she used to be able to do more    Has some DOE Deneis PND   No orthopnea   No palpitaitons   No further CP    Lives alone.  Pets   No relatives        Current Meds  Medication Sig   atorvastatin (LIPITOR) 20 MG tablet Take 1 tablet (20 mg total) by mouth daily. (Patient taking differently: Take 20 mg by mouth at bedtime.)   CALCIUM-MAGNESIUM-ZINC PO Take by mouth 2 (two) times daily.   cetirizine (ZYRTEC) 10 MG tablet Take 1 tablet (10 mg total) by mouth daily. (Patient taking differently: Take 10 mg by mouth daily as needed for allergies.)   Cholecalciferol (VITAMIN D3) 125 MCG (5000 UT) TABS Take by mouth daily.   linaclotide (LINZESS) 72 MCG capsule Take 1 capsule (72 mcg total) by mouth daily before breakfast.   pantoprazole (PROTONIX) 40 MG tablet Take 1 tablet (40 mg total) by mouth daily.   VENTOLIN HFA 108 (90 Base) MCG/ACT inhaler INHALE 2 PUFFS BY MOUTH EVERY 6 HOURS AS NEEDED FOR SHORTNESS OF BREATH (Patient taking differently: Inhale 2 puffs into the lungs every 6 (six) hours as needed for wheezing or shortness of breath.)   zolpidem (AMBIEN) 10 MG tablet Take 10 mg by mouth at bedtime. 0.5 tablet  nightly     Allergies:   Patient has no known allergies.   Past Medical History:  Diagnosis Date   Anemia    Asthma    Complication of anesthesia    "drop in blood pressure years ago after hysterectomy "    Depression    Dyspnea    History of bleeding ulcers    Hypercholesteremia    Multiple thyroid nodules    Osteoarthritis of knees, bilateral 05/23/2019   Seasonal allergies     Past Surgical History:  Procedure Laterality Date   ABDOMINAL HYSTERECTOMY     complete.    BIOPSY  10/02/2018   Procedure: BIOPSY;  Surgeon: Daneil Dolin, MD;  [facility];  Service: Endoscopy;;  gastric   CATARACT EXTRACTION W/PHACO Left 06/11/2019   Procedure: CATARACT EXTRACTION PHACO AND INTRAOCULAR LENS PLACEMENT (IOC) CDE: 4.63;  Surgeon: Baruch Goldmann, MD;  [facility];  Service: Ophthalmology;  Laterality: Left;   CATARACT EXTRACTION W/PHACO Right 06/25/2019   Procedure: CATARACT EXTRACTION PHACO AND INTRAOCULAR LENS PLACEMENT RIGHT EYE;  Surgeon: Baruch Goldmann, MD;  [facility];  Service: Ophthalmology;  Laterality: Right;  CDE: 5.61   CHOLECYSTECTOMY     COLONOSCOPY N/A 12/18/2015   Rourk: diverticulosis in sigmoid colon, otherwise normal    Cyst removed from ovary  1973   dental implants     ESOPHAGOGASTRODUODENOSCOPY (EGD) WITH PROPOFOL N/A 10/02/2018   Procedure: ESOPHAGOGASTRODUODENOSCOPY (EGD) WITH PROPOFOL;  Surgeon: Daneil Dolin, MD;  [facility];  Service: Endoscopy;  Laterality: N/A;  12:45pm   LAPAROSCOPIC CHOLECYSTECTOMY SINGLE SITE WITH INTRAOPERATIVE CHOLANGIOGRAM N/A 05/12/2016   Procedure: LAPAROSCOPIC CHOLECYSTECTOMY SINGLE SITE WITH INTRAOPERATIVE CHOLANGIOGRAM;  Surgeon: Michael Boston, MD;  [facility];  Service: General;  Laterality: N/A;   left hammer toe repair     MALONEY DILATION N/A 10/02/2018   Procedure: Venia Minks DILATION;  Surgeon: Daneil Dolin, MD;  [facility];  Service: Endoscopy;  Laterality: N/A;    thyroid needle biopsy     VIDEO BRONCHOSCOPY Bilateral 03/21/2017   Did not have done     Social History:  The patient  reports that she has never smoked. She has never used smokeless tobacco. She reports that she does not drink alcohol and does not use drugs.   Family History:  The patient's family history includes Breast cancer in her mother; Colon cancer in an other family member; Heart disease in her father; Tuberculosis in her mother.    ROS:  Please see the history of present illness. All other systems are reviewed and  Negative to the above problem except as noted.    PHYSICAL EXAM: VS:  BP 110/70   Pulse 64   Ht 5' 3.5" (1.613 m)   Wt 143 lb (64.9 kg)   SpO2 96%   BMI 24.93 kg/m   GEN: Well nourished, well developed, in no acute distress  HEENT: normal  Neck: no JVD, carotid bruits Cardiac: RRR;  Gr III/VI systolic murmurLSB  No LE  edema  Respiratory:  clear to auscultation bilaterally,  GI: soft, nontender, nondistended, + BS  No hepatomegaly  MS: no deformity Moving all extremities   Skin: warm and dry, no rash Neuro:  Strength and sensation are intact Psych: euthymic mood, full affect   EKG:  EKG is Not ordered today.  Echo   07/22/20  1. Left ventricular ejection fraction, by estimation, is 60 to 65%. The left ventricle has normal function. The left ventricle demonstrates regional wall motion abnormalities (see scoring diagram/findings for description). There is mild left ventricular hypertrophy. Left ventricular diastolic parameters were normal. Mild anteoseptal wall hypokinesis. 2. Right ventricular systolic function is normal. The right ventricular size is normal. There is normal pulmonary artery systolic pressure. 3. Left atrial size was severely dilated. 4. Right atrial size was moderately dilated. 5. Posterior leaflet prolapse resulting in eccentric anterior mitral regurgitation. The eccentricity makes it difficult to asses severity. MV to AV VTI ratio  of 1.4 would suggest moderate to severe MR. Consider TEE. . The mitral valve is abnormal. Moderate to severe mitral valve regurgitation. 6. The aortic valve is tricuspid. Aortic valve regurgitation is not visualized. No aortic stenosis is present. 7. The inferior vena cava is normal in size with greater than 50% respiratory variability, suggesting right atrial pressure of 3 mmHg. Lipid Panel    Component Value Date/Time   CHOL 154 06/13/2017 1123   TRIG 139 06/13/2017 1123   HDL 54 06/13/2017 1123   CHOLHDL 2.9 06/13/2017 1123   LDLCALC 76 06/13/2017 1123      Wt Readings from Last  3 Encounters:  08/25/20 143 lb (64.9 kg)  07/07/20 145 lb (65.8 kg)  11/30/19 141 lb (64 kg)      ASSESSMENT AND PLAN:  1  Mitral regurgitation   I have spent time reviewing pathophysicology of MV regurgitation   I think she is symtpomatic from it   I would recomm a TEE to evaluate / define valve further   I would also plan of L heart caht to r/o coincident CAD    Further plans based on these results  Risks/ benefits of TEE discussed  Patient understands and agrees to proceed.   Get CBC and BMET today for this and cath   Current medicines are reviewed at length with the patient today.  The patient does not have concerns regarding medicines.  Signed, [name], MD  08/25/2020 3:48 PM    [address], [place], [place]  [address] Phone: [phone]; Fax: [phone]

## 2020-08-25 NOTE — Patient Instructions (Signed)
Medication Instructions:  Your physician recommends that you continue on your current medications as directed. Please refer to the Current Medication list given to you today.  *If you need a refill on your cardiac medications before your next appointment, please call your pharmacy*   Lab Work: CBC BMET If you have labs (blood work) drawn today and your tests are completely normal, you will receive your results only by: Vera (if you have MyChart) OR A paper copy in the mail If you have any lab test that is abnormal or we need to change your treatment, we will call you to review the results.   Testing/Procedures: Your physician has requested that you have a TEE. During a TEE, sound waves are used to create images of your heart. It provides your doctor with information about the size and shape of your heart and how well your heart's chambers and valves are working. In this test, a transducer is attached to the end of a flexible tube that's guided down your throat and into your esophagus (the tube leading from you mouth to your stomach) to get a more detailed image of your heart. You are not awake for the procedure. Please see the instruction sheet given to you today. For further information please visit [email or web address].     Follow-Up: At [facility], you and your health needs are our priority.  As part of our continuing mission to provide you with exceptional heart care, we have created designated Provider Care Teams.  These Care Teams include your primary Cardiologist (physician) and Advanced Practice Providers (APPs -  Physician Assistants and Nurse Practitioners) who all work together to provide you with the care you need, when you need it.  We recommend signing up for the patient portal called "MyChart".  Sign up information is provided on this After Visit Summary.  MyChart is used to connect with patients for Virtual Visits (Telemedicine).  Patients are able to view  lab/test results, encounter notes, upcoming appointments, etc.  Non-urgent messages can be sent to your provider as well.   To learn more about what you can do with MyChart, go to [email or web address].    Your next appointment:   Follow Up: TO BE DETERMINED  Other Instructions

## 2020-08-29 ENCOUNTER — Telehealth: Payer: Self-pay | Admitting: Licensed Clinical Social Worker

## 2020-08-29 ENCOUNTER — Encounter: Payer: Self-pay | Admitting: *Deleted

## 2020-08-29 ENCOUNTER — Telehealth: Payer: Self-pay | Admitting: *Deleted

## 2020-08-29 ENCOUNTER — Telehealth: Payer: Self-pay | Admitting: Internal Medicine

## 2020-08-29 NOTE — Progress Notes (Signed)
Heart and Vascular Care Navigation  08/29/2020  Amber Bird March 04, 1945 [id number]  Reason for Referral:  Engaged with patient by telephone for initial visit for Heart and Vascular Care Coordination.                                                                                                   Assessment:                            LCSW reached out to pt per referral from Dr. Harrington Challenger regarding assistance as needed prior to upcoming procedures. LCSW able to reach pt via telephone at [phone]. Introduced self, role, reason for call. Pt confirmed home address, PCP, and that her neighbor remains her emergency contact but otherwise she does not have any other family/friends to add. Pt relocated from [facility] a little less than a decade ago following the loss of several family members and the end of her marriage. She shares that she appreciates how friendly people in her area are but that she has not been able to find a solid group of friends/support. She does have three pets: two cats and a Interior and spatial designer . she gets lots of emotional satisfaction by caring for them. Pt shares that she gets by on a limited income of about $1032/mo. She is able to afford her mortgage, internet/phone, and gets $70/mo in food assistance from her [employer] plan. She is able to afford her medications and is enrolled in Medicare and Medicaid. She drives herself to a from appointments and to get groceries etc. She is interested in enrollment in Transportation Services to get to upcoming procedures as she does not have any neighbors etc that she feels comfortable asking since she had a negative previous experience.   LCSW shared that Dr. Harrington Challenger mentioned that pt may benefit from some support with her animals post procedures. Inquired if pt would find that helpful to have someone come in or to board the dogs at vet while resting afterward. Pt states that she does not want anyone coming into her home and she doesn't  feel comfortable leaving her dog with someone else as he has some ongoing skin issues. She is very appreciative of the offer but states that she would feel bad accepting assistance when there are others that could use it more.   LCSW encouraged pt to consider and to reach out to me if there are any additional questions/concerns that may arise or if she does feel that would be helpful to have some assistance.           HRT/VAS Care Coordination     Patients Home Cardiology Office [facility]   Outpatient Care Team Social Worker   Social Worker Name: Westley Hummer, LCSW, [place] arrangements for the past 2 months Single Family Home   Lives with: Self; Pets  two cats and a golden retriever puppy   Patient Current Insurance Coverage Managed Medicare; Medicaid   Patient Has Concern With Paying Medical Bills No   Does  Patient Have Prescription Coverage? Yes   Home Assistive Devices/Equipment None       Social History:                                                                             SDOH Screenings   Alcohol Screen: Not on file  Depression (PHQ2-9): Not on file  Financial Resource Strain: Medium Risk   Difficulty of Paying Living Expenses: Somewhat hard  Food Insecurity: No Food Insecurity   Worried About Running Out of Food in the Last Year: Never true   Ran Out of Food in the Last Year: Never true  Housing: Low Risk    Last Housing Risk Score: 0  Physical Activity: Not on file  Social Connections: Not on file  Stress: Not on file  Tobacco Use: Low Risk    Smoking Tobacco Use: Never   Smokeless Tobacco Use: Never  Transportation Needs: No Transportation Needs   Lack of Transportation (Medical): No   Lack of Transportation (Non-Medical): No    SDOH Interventions: Financial Resources:  Financial Strain Interventions: Other (Comment), Patient Refused (pt denied any current needs, offered assistance w/ various things but pt declines although  appreciative)   Food Insecurity:  Food Insecurity Interventions: Other (Comment) (pt recieves $70/mo towards food from her [employer] plan)  Housing Insecurity:  Housing Interventions: Intervention Not Indicated  Transportation:   Transportation Interventions: Financial planner (pt enrolled by [employer] staff for [employer] to assist w/ upcoming procedures)    Follow-up plan:   LCSW has mailed my card, [employer] card and list of alternate [employer]. LCSW will f/u prior to procedure on 8/5 to ensure pt has been contacted about ride. Remain available as needed for any additional questions/concerns.

## 2020-08-29 NOTE — Telephone Encounter (Signed)
Patient called to report she has been having terrible headaches for the last few weeks. She has been taking 2 Ibuprofen for about a week now. In years past her Gertie Fey told her not to take due bleeding ulcers she had. She keeps her thermostat on 78-79 but stays cold all the time. She was just in office on Monday but she was too caught up in her heart problem and forgot to mention. She wants Alda Berthold to let Dr Harrington Challenger know and can be reach # [phone].

## 2020-08-29 NOTE — Telephone Encounter (Signed)
Spoke with [id number] with [employer]. Pt has been set up for transportation on 09/05/20. Earl Many will call and notify pt of time of transportation.

## 2020-08-29 NOTE — Patient Instructions (Signed)
Amber Bird  08/29/2020     '@PREFPERIOPPHARMACY'$ @   Your procedure is scheduled on  09/05/2020.   Report to [facility] at  [place] [facility]   Call this number if you have problems the morning of surgery:  [phone]   Remember:  Do not eat or drink after midnight.      Take these medicines the morning of surgery with A SIP OF WATER                                       zyrtec, protonix.     Do not wear jewelry, make-up or nail polish.  Do not wear lotions, powders, or perfumes, or deodorant.  Do not shave 48 hours prior to surgery.  Men may shave face and neck.  Do not bring valuables to the hospital.  [facility] is not responsible for any belongings or valuables.  Contacts, dentures or bridgework may not be worn into surgery.  Leave your suitcase in the car.  After surgery it may be brought to your room.  For patients admitted to the hospital, discharge time will be determined by your treatment team.  Patients discharged the day of surgery will not be allowed to drive home and must  have someone with them for 24 hours.    Special instructions:   DO NOT smoke tobacco or vape for 24 hours before your procedure.  Please read over the following fact sheets that you were given. Anesthesia Post-op Instructions and Care and Recovery After Surgery        Transesophageal Echocardiogram Transesophageal echocardiogram (TEE) is a test that uses sound waves to take pictures of your heart. TEE is done by passing a small probe attached to a flexible tube down the part of the body that moves food from your mouth to your stomach (esophagus). The pictures give clear images of your heart. This can help your doctor seeif there are problems with your heart. Tell a doctor about: Any allergies you have. All medicines you are taking. This includes vitamins, herbs, eye drops, creams, and over-the-counter medicines. Any problems you or family members have had with anesthetic  medicines. Any blood disorders you have. Any surgeries you have had. Any medical conditions you have. Any swallowing problems. Whether you have or have had a blockage in the part of the body that moves food from your mouth to your stomach. Whether you are pregnant or may be pregnant. What are the risks? In general, this is a safe procedure. But, problems may occur, such as: Damage to nearby structures or organs. A tear in the part of the body that moves food from your mouth to your stomach. Irregular heartbeat. Hoarse voice or trouble swallowing. Bleeding. What happens before the procedure? Medicines Ask your doctor about changing or stopping: Your normal medicines. Vitamins, herbs, and supplements. Over-the-counter medicines. Do not take aspirin or ibuprofen unless you are told to. General instructions Follow instructions from your doctor about what you cannot eat or drink. You will take out any dentures or dental retainers. Plan to have a responsible adult take you home from the hospital or clinic. Plan to have a responsible adult care for you for the time you are told after you leave the hospital or clinic. This is important. What happens during the procedure?  An IV will be put into one of your veins. You may  be given: A sedative. This medicine helps you relax. A medicine to numb the back of your throat. This may be sprayed or gargled. Your blood pressure, heart rate, and breathing will be watched. You may be asked to lie on your left side. A bite block will be placed in your mouth. This keeps you from biting the tube. The tip of the probe will be placed into the back of your mouth. You will be asked to swallow. Your doctor will take pictures of your heart. The probe and bite block will be taken out after the test is done. The procedure may vary among doctors and hospitals. What can I expect after the procedure? You will be monitored until you leave the hospital or  clinic. This includes checking your blood pressure, heart rate, breathing rate, and blood oxygen level. Your throat may feel sore and numb. This will get better over time. You will not be allowed to eat or drink until the numbness has gone away. It is common to have a sore throat for a day or two. It is up to you to get the results of your procedure. Ask how to get your results when they are ready. Follow these instructions at home: If you were given a sedative during your procedure, do not drive or use machines until your doctor says that it is safe. Return to your normal activities when your doctor says that it is safe. Keep all follow-up visits. Summary TEE is a test that uses sound waves to take pictures of your heart. You will be given a medicine to help you relax. Do not drive or use machines until your doctor says that it is safe. This information is not intended to replace advice given to you by your health care provider. Make sure you discuss any questions you have with your healthcare provider. Document Revised: 09/11/2019 Document Reviewed: 09/11/2019 Elsevier Patient Education  2022 [place] After This sheet gives you information about how to care for yourself after your procedure. Your health care provider may also give you more specific instructions. If you have problems or questions, contact your health careprovider. What can I expect after the procedure? After the procedure, it is common to have: Tiredness. Forgetfulness about what happened after the procedure. Impaired judgment for important decisions. Nausea or vomiting. Some difficulty with balance. Follow these instructions at home: For the time period you were told by your health care provider:     Rest as needed. Do not participate in activities where you could fall or become injured. Do not drive or use machinery. Do not drink alcohol. Do not take sleeping pills or  medicines that cause drowsiness. Do not make important decisions or sign legal documents. Do not take care of children on your own. Eating and drinking Follow the diet that is recommended by your health care provider. Drink enough fluid to keep your urine pale yellow. If you vomit: Drink water, juice, or soup when you can drink without vomiting. Make sure you have little or no nausea before eating solid foods. General instructions Have a responsible adult stay with you for the time you are told. It is important to have someone help care for you until you are awake and alert. Take over-the-counter and prescription medicines only as told by your health care provider. If you have sleep apnea, surgery and certain medicines can increase your risk for breathing problems. Follow instructions from your health care provider about wearing your  sleep device: Anytime you are sleeping, including during daytime naps. While taking prescription pain medicines, sleeping medicines, or medicines that make you drowsy. Avoid smoking. Keep all follow-up visits as told by your health care provider. This is important. Contact a health care provider if: You keep feeling nauseous or you keep vomiting. You feel light-headed. You are still sleepy or having trouble with balance after 24 hours. You develop a rash. You have a fever. You have redness or swelling around the IV site. Get help right away if: You have trouble breathing. You have new-onset confusion at home. Summary For several hours after your procedure, you may feel tired. You may also be forgetful and have poor judgment. Have a responsible adult stay with you for the time you are told. It is important to have someone help care for you until you are awake and alert. Rest as told. Do not drive or operate machinery. Do not drink alcohol or take sleeping pills. Get help right away if you have trouble breathing, or if you suddenly become confused. This  information is not intended to replace advice given to you by your health care provider. Make sure you discuss any questions you have with your healthcare provider. Document Revised: 10/04/2019 Document Reviewed: 12/21/2018 Elsevier Patient Education  2022 [employer].

## 2020-09-01 ENCOUNTER — Telehealth: Payer: Self-pay | Admitting: Family Medicine

## 2020-09-01 NOTE — Telephone Encounter (Signed)
Patient is now enrolled into the Amber Bird.   Amber Bird DOB: [birth year] MRN: [id number]   RIDER WAIVER AND RELEASE OF LIABILITY  For purposes of improving physical access to our facilities, Amber Bird is pleased to partner with third parties to provide Amber Bird patients or other authorized individuals the option of convenient, on-demand ground Amber Bird (the [place]") through use of the technology service that enables users to request on-demand ground Amber from independent third-party providers.  By opting to use and accept these Amber Bird, I, the undersigned, hereby agree on behalf of myself, and on behalf of any minor child using the Amber Bird, as follows:  Amber Bird provided to me are provided by independent third-party Amber providers who are not Amber Bird or employees and who are unaffiliated with Amber Bird. Amber Bird is neither a Amber carrier nor a common or public carrier. Amber Bird has no control over the quality or safety of the Amber that occurs as a result of the Amber Bird. Amber Bird cannot guarantee that any third-party Amber provider will complete any arranged Amber service. Amber Bird makes no representation, warranty, or guarantee regarding the reliability, timeliness, quality, safety, suitability, or availability of any of the Amber Bird or that they will be error free. I fully understand that traveling by vehicle involves risks and dangers of serious bodily injury, including permanent disability, paralysis, and death. I agree, on behalf of myself and on behalf of any minor child using the Amber Bird for whom I am the parent or legal Bird, that the entire risk arising out of my use of the Amber Bird remains solely with me, to the maximum extent permitted  under applicable law. The Amber Bird are provided "as is" and "as available." Amber Bird disclaims all representations and warranties, express, implied or statutory, not expressly set out in these terms, including the implied warranties of merchantability and fitness for a particular purpose. I hereby waive and release Amber Bird, its agents, employees, officers, directors, representatives, insurers, attorneys, assigns, successors, subsidiaries, and affiliates from any and all past, present, or future claims, demands, liabilities, actions, causes of action, or suits of any kind directly or indirectly arising from acceptance and use of the Amber Bird. I further waive and release Amber Bird and its affiliates from all present and future liability and responsibility for any injury or death to persons or damages to property caused by or related to the use of the Amber Bird. I have read this Waiver and Release of Liability, and I understand the terms used in it and their legal significance. This Waiver is freely and voluntarily given with the understanding that my right (as well as the right of any minor child for whom I am the parent or legal Bird using the Amber Bird) to legal recourse against Amber Bird in connection with the Amber Bird is knowingly surrendered in return for use of these Bird.   I attest that I read the consent document to Amber Bird, gave Amber Bird the opportunity to ask questions and answered the questions asked (if any). I affirm that Amber Bird then provided consent for she's participation in this Bird.     Amber Bird

## 2020-09-01 NOTE — Telephone Encounter (Signed)
New message    Patient called she it arranging transportation for Friday procedure, how long will she be at the hospital ?

## 2020-09-01 NOTE — Telephone Encounter (Signed)
I explained  that she could be done within 2-3 hours.

## 2020-09-03 ENCOUNTER — Encounter (HOSPITAL_COMMUNITY)
Admission: RE | Admit: 2020-09-03 | Discharge: 2020-09-03 | Disposition: A | Discharge status: Home / Self Care | Payer: Medicare HMO | Source: Ambulatory Visit | Attending: Cardiology | Admitting: Cardiology

## 2020-09-03 ENCOUNTER — Other Ambulatory Visit: Payer: Self-pay

## 2020-09-03 ENCOUNTER — Encounter (HOSPITAL_COMMUNITY): Payer: Self-pay

## 2020-09-03 HISTORY — DX: Cardiac arrhythmia, unspecified: I49.9

## 2020-09-03 NOTE — Progress Notes (Signed)
Patient is scheduled for a TEE on Friday 09/05/2020.  She has a ride home but no one to stay with her for 24 hours post procedure. Explained to patient that she would have to be admitted if she does not have someone to stay with her.

## 2020-09-04 ENCOUNTER — Encounter (HOSPITAL_COMMUNITY): Payer: Self-pay | Admitting: Certified Registered"

## 2020-09-04 ENCOUNTER — Telehealth: Payer: Self-pay | Admitting: Internal Medicine

## 2020-09-04 NOTE — Telephone Encounter (Signed)
Patient was returning Dr. Harrington Challenger call. Please advise

## 2020-09-04 NOTE — Telephone Encounter (Signed)
New Message     Patient wants to know how much danger she is in with her heart?  AP cancelled the TEE for 09/05/20 because she did not have anyone who could stay with her after the procedure.

## 2020-09-05 ENCOUNTER — Ambulatory Visit: Admit: 2020-09-05 | Payer: Medicare HMO | Admitting: Cardiology

## 2020-09-05 SURGERY — ECHOCARDIOGRAM, TRANSESOPHAGEAL
Anesthesia: Monitor Anesthesia Care

## 2020-09-06 ENCOUNTER — Other Ambulatory Visit (HOSPITAL_COMMUNITY): Payer: Medicare HMO

## 2020-09-11 ENCOUNTER — Telehealth: Payer: Self-pay | Admitting: Internal Medicine

## 2020-09-11 NOTE — Telephone Encounter (Signed)
Pt states that she woke up this morning to chest pain at 2:30 AM that was on and off till 7:30. She did take an ASA. She states that her entire chest was hurting. She rates pain as 10/10. She states that she was SOB during chest pain. Denies nausea. Please advise.

## 2020-09-11 NOTE — Telephone Encounter (Signed)
Patient says he has had chest presssure and some SOB  last night had severe CP at 2:30   Got up  Took some ASA and water   Went to bed   Pain went away   Went back to sleep   Woke up twice after with discomfort  Now watching tv   Feeling OK in chest  Breathing is OK     Patient says she will do whatever it takes   OK to stay in hospital   Will board animals if needed  TEE could not be done last week because she lives alone    Had no one to stay with dogs  See about TEE and possible cath at [facility] early next week

## 2020-09-11 NOTE — Telephone Encounter (Signed)
Pt c/o of Chest Pain: 1. Are you having CP right now? NO 2. Are you experiencing any other symptoms (ex. SOB, nausea, vomiting, sweating)? YES SHORTNESS OF BREATH  3. How long have you been experiencing CP? Yes Woke up 230am with chest pains.  4. Is your CP continuous or coming and going? Both  5. Have you taken Nitroglycerin? No    Patient states that she can't breathe. States that she is wanting to have TEE re-scheduled and be admitted to hospital. Took Aspirin around 230am.

## 2020-09-16 DIAGNOSIS — Z0000 Encounter for general adult medical examination without abnormal findings: Secondary | ICD-10-CM | POA: Diagnosis not present

## 2020-09-16 DIAGNOSIS — K5900 Constipation, unspecified: Secondary | ICD-10-CM | POA: Diagnosis not present

## 2020-09-16 DIAGNOSIS — D869 Sarcoidosis, unspecified: Secondary | ICD-10-CM | POA: Diagnosis not present

## 2020-09-16 DIAGNOSIS — R69 Illness, unspecified: Secondary | ICD-10-CM | POA: Diagnosis not present

## 2020-09-16 DIAGNOSIS — E782 Mixed hyperlipidemia: Secondary | ICD-10-CM | POA: Diagnosis not present

## 2020-09-16 DIAGNOSIS — J4520 Mild intermittent asthma, uncomplicated: Secondary | ICD-10-CM | POA: Diagnosis not present

## 2020-09-16 DIAGNOSIS — K219 Gastro-esophageal reflux disease without esophagitis: Secondary | ICD-10-CM | POA: Diagnosis not present

## 2020-09-16 DIAGNOSIS — G4700 Insomnia, unspecified: Secondary | ICD-10-CM | POA: Diagnosis not present

## 2020-09-16 DIAGNOSIS — I700 Atherosclerosis of aorta: Secondary | ICD-10-CM | POA: Diagnosis not present

## 2020-09-16 DIAGNOSIS — I340 Nonrheumatic mitral (valve) insufficiency: Secondary | ICD-10-CM | POA: Diagnosis not present

## 2020-09-16 NOTE — Telephone Encounter (Signed)
Patient called stating that she was trying to find out if her TEE has been scheduled.

## 2020-09-17 ENCOUNTER — Telehealth: Payer: Self-pay | Admitting: Internal Medicine

## 2020-09-17 NOTE — Telephone Encounter (Signed)
Pt states that she is available for TEE on tomorrow or next week.

## 2020-09-17 NOTE — Telephone Encounter (Signed)
Patient called Amber Bird back. She is supposed to be scheduling TEE. She can be reached at ph# [phone].

## 2020-09-17 NOTE — Telephone Encounter (Addendum)
Spoke with Meredith Leeds ( Ref # [id number]) at [facility] [phone]) who states that pt's stay will be covered by them. Provider and pt notified.

## 2020-09-17 NOTE — Telephone Encounter (Signed)
Pt scheduled for TEE at 8 am on 09/18/20 with Dr. Johnsie Cancel and a left heart cath at 10:30 with Dr. Tamala Julian. Pt notified of place and time to arrive. Orders to be placed by MD.

## 2020-09-18 ENCOUNTER — Encounter (HOSPITAL_COMMUNITY)
Admission: RE | Disposition: A | Discharge status: Home / Self Care | Payer: Self-pay | Source: Home / Self Care | Attending: Cardiology

## 2020-09-18 ENCOUNTER — Ambulatory Visit (HOSPITAL_COMMUNITY)
Admission: RE | Admit: 2020-09-18 | Discharge: 2020-09-19 | Disposition: A | Discharge status: Home / Self Care | Payer: Medicare HMO | Attending: Cardiology | Admitting: Cardiology

## 2020-09-18 ENCOUNTER — Other Ambulatory Visit: Payer: Self-pay

## 2020-09-18 ENCOUNTER — Encounter (HOSPITAL_COMMUNITY): Payer: Self-pay | Admitting: Cardiovascular Disease

## 2020-09-18 ENCOUNTER — Ambulatory Visit (HOSPITAL_COMMUNITY): Payer: Medicare HMO | Admitting: Certified Registered"

## 2020-09-18 ENCOUNTER — Ambulatory Visit (HOSPITAL_BASED_OUTPATIENT_CLINIC_OR_DEPARTMENT_OTHER)
Admission: RE | Admit: 2020-09-18 | Discharge: 2020-09-18 | Disposition: A | Discharge status: Home / Self Care | Payer: Medicare HMO | Source: Home / Self Care | Attending: Internal Medicine | Admitting: Internal Medicine

## 2020-09-18 DIAGNOSIS — I340 Nonrheumatic mitral (valve) insufficiency: Secondary | ICD-10-CM | POA: Diagnosis not present

## 2020-09-18 DIAGNOSIS — I341 Nonrheumatic mitral (valve) prolapse: Secondary | ICD-10-CM | POA: Diagnosis not present

## 2020-09-18 DIAGNOSIS — Z7982 Long term (current) use of aspirin: Secondary | ICD-10-CM | POA: Insufficient documentation

## 2020-09-18 DIAGNOSIS — J302 Other seasonal allergic rhinitis: Secondary | ICD-10-CM | POA: Diagnosis not present

## 2020-09-18 DIAGNOSIS — Z8249 Family history of ischemic heart disease and other diseases of the circulatory system: Secondary | ICD-10-CM | POA: Diagnosis not present

## 2020-09-18 DIAGNOSIS — I342 Nonrheumatic mitral (valve) stenosis: Secondary | ICD-10-CM | POA: Diagnosis not present

## 2020-09-18 DIAGNOSIS — I2510 Atherosclerotic heart disease of native coronary artery without angina pectoris: Secondary | ICD-10-CM | POA: Diagnosis not present

## 2020-09-18 DIAGNOSIS — Z79899 Other long term (current) drug therapy: Secondary | ICD-10-CM | POA: Diagnosis not present

## 2020-09-18 DIAGNOSIS — E7800 Pure hypercholesterolemia, unspecified: Secondary | ICD-10-CM | POA: Diagnosis not present

## 2020-09-18 DIAGNOSIS — D649 Anemia, unspecified: Secondary | ICD-10-CM | POA: Diagnosis not present

## 2020-09-18 DIAGNOSIS — I081 Rheumatic disorders of both mitral and tricuspid valves: Secondary | ICD-10-CM | POA: Diagnosis not present

## 2020-09-18 HISTORY — PX: TEE WITHOUT CARDIOVERSION: SHX5443

## 2020-09-18 HISTORY — PX: RIGHT/LEFT HEART CATH AND CORONARY ANGIOGRAPHY: CATH118266

## 2020-09-18 LAB — POCT I-STAT 7, (LYTES, BLD GAS, ICA,H+H)
Acid-Base Excess: 0 mmol/L (ref 0.0–2.0)
Acid-Base Excess: 0 mmol/L (ref 0.0–2.0)
Acid-Base Excess: 1 mmol/L (ref 0.0–2.0)
Bicarbonate: 25.8 mmol/L (ref 20.0–28.0)
Bicarbonate: 26.1 mmol/L (ref 20.0–28.0)
Bicarbonate: 27.1 mmol/L (ref 20.0–28.0)
Calcium, Ion: 1.22 mmol/L (ref 1.15–1.40)
Calcium, Ion: 1.28 mmol/L (ref 1.15–1.40)
Calcium, Ion: 1.29 mmol/L (ref 1.15–1.40)
HCT: 34 % — ABNORMAL LOW (ref 36.0–46.0)
HCT: 36 % (ref 36.0–46.0)
HCT: 36 % (ref 36.0–46.0)
Hemoglobin: 11.6 g/dL — ABNORMAL LOW (ref 12.0–15.0)
Hemoglobin: 12.2 g/dL (ref 12.0–15.0)
Hemoglobin: 12.2 g/dL (ref 12.0–15.0)
O2 Saturation: 78 %
O2 Saturation: 78 %
O2 Saturation: 99 %
Potassium: 3.8 mmol/L (ref 3.5–5.1)
Potassium: 3.9 mmol/L (ref 3.5–5.1)
Potassium: 4 mmol/L (ref 3.5–5.1)
Sodium: 141 mmol/L (ref 135–145)
Sodium: 142 mmol/L (ref 135–145)
Sodium: 142 mmol/L (ref 135–145)
TCO2: 27 mmol/L (ref 22–32)
TCO2: 28 mmol/L (ref 22–32)
TCO2: 28 mmol/L (ref 22–32)
pCO2 arterial: 45.9 mmHg (ref 32.0–48.0)
pCO2 arterial: 47 mmHg (ref 32.0–48.0)
pCO2 arterial: 47.5 mmHg (ref 32.0–48.0)
pH, Arterial: 7.353 (ref 7.350–7.450)
pH, Arterial: 7.358 (ref 7.350–7.450)
pH, Arterial: 7.363 (ref 7.350–7.450)
pO2, Arterial: 137 mmHg — ABNORMAL HIGH (ref 83.0–108.0)
pO2, Arterial: 45 mmHg — ABNORMAL LOW (ref 83.0–108.0)
pO2, Arterial: 45 mmHg — ABNORMAL LOW (ref 83.0–108.0)

## 2020-09-18 LAB — ECHO TEE
MV M vel: 6.01 m/s
MV Peak grad: 144.5 mmHg
Radius: 0.9 cm

## 2020-09-18 SURGERY — LEFT HEART CATH AND CORONARY ANGIOGRAPHY
Anesthesia: LOCAL

## 2020-09-18 SURGERY — ECHOCARDIOGRAM, TRANSESOPHAGEAL
Anesthesia: Monitor Anesthesia Care

## 2020-09-18 SURGERY — RIGHT/LEFT HEART CATH AND CORONARY ANGIOGRAPHY
Anesthesia: LOCAL

## 2020-09-18 MED ORDER — LIDOCAINE HCL (PF) 1 % IJ SOLN
INTRAMUSCULAR | Status: AC
  Filled 2020-09-18: qty 30

## 2020-09-18 MED ORDER — ASPIRIN 81 MG PO CHEW: 81.0000 mg | CHEWABLE_TABLET | ORAL | Status: DC

## 2020-09-18 MED ORDER — PANTOPRAZOLE SODIUM 40 MG PO TBEC
40.0000 mg | DELAYED_RELEASE_TABLET | Freq: Every day | ORAL | Status: DC
  Administered 2020-09-19: 40 mg via ORAL
  Filled 2020-09-18: qty 1

## 2020-09-18 MED ORDER — ACETAMINOPHEN 325 MG PO TABS: 650.0000 mg | ORAL_TABLET | ORAL | Status: DC | PRN

## 2020-09-18 MED ORDER — LORATADINE 10 MG PO TABS
10.0000 mg | ORAL_TABLET | Freq: Every day | ORAL | Status: DC
Start: 2020-09-18 — End: 2020-09-19
  Administered 2020-09-19: 10 mg via ORAL
  Filled 2020-09-18: qty 1

## 2020-09-18 MED ORDER — SODIUM CHLORIDE 0.9 % IV SOLN: 250.0000 mL | INTRAVENOUS | Status: DC | PRN

## 2020-09-18 MED ORDER — FENTANYL CITRATE (PF) 100 MCG/2ML IJ SOLN
INTRAMUSCULAR | Status: DC | PRN
  Administered 2020-09-18: 25 ug via INTRAVENOUS

## 2020-09-18 MED ORDER — MIDAZOLAM HCL 2 MG/2ML IJ SOLN
INTRAMUSCULAR | Status: DC | PRN
  Administered 2020-09-18: 1 mg via INTRAVENOUS

## 2020-09-18 MED ORDER — HEPARIN (PORCINE) IN NACL 1000-0.9 UT/500ML-% IV SOLN
INTRAVENOUS | Status: AC
  Filled 2020-09-18: qty 500

## 2020-09-18 MED ORDER — SODIUM CHLORIDE 0.9% FLUSH: 3.0000 mL | INTRAVENOUS | Status: DC | PRN

## 2020-09-18 MED ORDER — ASPIRIN EC 81 MG PO TBEC
81.0000 mg | DELAYED_RELEASE_TABLET | Freq: Every day | ORAL | Status: DC
  Administered 2020-09-19: 81 mg via ORAL
  Filled 2020-09-18: qty 1

## 2020-09-18 MED ORDER — SODIUM CHLORIDE 0.9 % IV SOLN: INTRAVENOUS | Status: DC

## 2020-09-18 MED ORDER — HEPARIN SODIUM (PORCINE) 1000 UNIT/ML IJ SOLN
INTRAMUSCULAR | Status: DC | PRN
  Administered 2020-09-18: 3000 [IU] via INTRAVENOUS

## 2020-09-18 MED ORDER — HEPARIN (PORCINE) IN NACL 1000-0.9 UT/500ML-% IV SOLN
INTRAVENOUS | Status: DC | PRN
  Administered 2020-09-18 (×2): 500 mL

## 2020-09-18 MED ORDER — SODIUM CHLORIDE 0.9% FLUSH
3.0000 mL | Freq: Two times a day (BID) | INTRAVENOUS | Status: DC
  Administered 2020-09-19 (×2): 3 mL via INTRAVENOUS

## 2020-09-18 MED ORDER — ZOLPIDEM TARTRATE 5 MG PO TABS
5.0000 mg | ORAL_TABLET | Freq: Once | ORAL | Status: AC
  Administered 2020-09-18: 5 mg via ORAL
  Filled 2020-09-18: qty 1

## 2020-09-18 MED ORDER — FENTANYL CITRATE (PF) 100 MCG/2ML IJ SOLN
INTRAMUSCULAR | Status: AC
  Filled 2020-09-18: qty 2

## 2020-09-18 MED ORDER — SODIUM CHLORIDE 0.9% FLUSH: 3.0000 mL | Freq: Two times a day (BID) | INTRAVENOUS | Status: DC

## 2020-09-18 MED ORDER — LINACLOTIDE 72 MCG PO CAPS
72.0000 ug | ORAL_CAPSULE | Freq: Every day | ORAL | Status: DC
  Administered 2020-09-19: 72 ug via ORAL
  Filled 2020-09-18: qty 1

## 2020-09-18 MED ORDER — SODIUM CHLORIDE 0.9% FLUSH
3.0000 mL | INTRAVENOUS | Status: DC | PRN
Start: 2020-09-18 — End: 2020-09-18

## 2020-09-18 MED ORDER — VERAPAMIL HCL 2.5 MG/ML IV SOLN
INTRAVENOUS | Status: DC | PRN
  Administered 2020-09-18: 10 mL via INTRA_ARTERIAL

## 2020-09-18 MED ORDER — PROPOFOL 500 MG/50ML IV EMUL
INTRAVENOUS | Status: DC | PRN
  Administered 2020-09-18: 200 ug/kg/min via INTRAVENOUS

## 2020-09-18 MED ORDER — BUTAMBEN-TETRACAINE-BENZOCAINE 2-2-14 % EX AERO
INHALATION_SPRAY | CUTANEOUS | Status: DC | PRN
  Administered 2020-09-18: 2 via TOPICAL

## 2020-09-18 MED ORDER — IOHEXOL 350 MG/ML SOLN
INTRAVENOUS | Status: DC | PRN
  Administered 2020-09-18: 70 mL via INTRA_ARTERIAL

## 2020-09-18 MED ORDER — VITAMIN D 25 MCG (1000 UNIT) PO TABS
5000.0000 [IU] | ORAL_TABLET | Freq: Every day | ORAL | Status: DC
  Administered 2020-09-19: 5000 [IU] via ORAL
  Filled 2020-09-18 (×2): qty 5

## 2020-09-18 MED ORDER — ATORVASTATIN CALCIUM 10 MG PO TABS
20.0000 mg | ORAL_TABLET | Freq: Every day | ORAL | Status: DC
  Administered 2020-09-18: 20 mg via ORAL
  Filled 2020-09-18: qty 2

## 2020-09-18 MED ORDER — VERAPAMIL HCL 2.5 MG/ML IV SOLN
INTRAVENOUS | Status: AC
  Filled 2020-09-18: qty 2

## 2020-09-18 MED ORDER — ONDANSETRON HCL 4 MG/2ML IJ SOLN: 4.0000 mg | Freq: Four times a day (QID) | INTRAMUSCULAR | Status: DC | PRN

## 2020-09-18 MED ORDER — PHENYLEPHRINE 40 MCG/ML (10ML) SYRINGE FOR IV PUSH (FOR BLOOD PRESSURE SUPPORT)
PREFILLED_SYRINGE | INTRAVENOUS | Status: DC | PRN
  Administered 2020-09-18 (×2): 80 ug via INTRAVENOUS

## 2020-09-18 MED ORDER — MIDAZOLAM HCL 2 MG/2ML IJ SOLN
INTRAMUSCULAR | Status: AC
  Filled 2020-09-18: qty 2

## 2020-09-18 MED ORDER — ONDANSETRON HCL 4 MG/2ML IJ SOLN
INTRAMUSCULAR | Status: DC | PRN
  Administered 2020-09-18: 4 mg via INTRAVENOUS

## 2020-09-18 MED ORDER — OLOPATADINE HCL 0.1 % OP SOLN
1.0000 [drp] | Freq: Every day | OPHTHALMIC | Status: DC | PRN
  Filled 2020-09-18: qty 5

## 2020-09-18 MED ORDER — SODIUM CHLORIDE 0.9 % WEIGHT BASED INFUSION: 1.0000 mL/kg/h | INTRAVENOUS | Status: AC

## 2020-09-18 MED ORDER — LIDOCAINE HCL (PF) 1 % IJ SOLN
INTRAMUSCULAR | Status: DC | PRN
  Administered 2020-09-18 (×2): 2 mL via SUBCUTANEOUS

## 2020-09-18 SURGICAL SUPPLY — 12 items
CATH 5FR JL3.5 JR4 ANG PIG MP (CATHETERS) ×2 IMPLANT
CATH BALLN WEDGE 5F 110CM (CATHETERS) ×2 IMPLANT
GLIDESHEATH SLEND SS 6F .021 (SHEATH) ×2 IMPLANT
GUIDEWIRE INQWIRE 1.5J.035X260 (WIRE) ×1 IMPLANT
INQWIRE 1.5J .035X260CM (WIRE) ×2
KIT HEART LEFT (KITS) ×2 IMPLANT
PACK CARDIAC CATHETERIZATION (CUSTOM PROCEDURE TRAY) ×2 IMPLANT
SHEATH GLIDE SLENDER 4/5FR (SHEATH) ×2 IMPLANT
SHEATH PROBE COVER 6X72 (BAG) ×2 IMPLANT
SYR MEDRAD MARK 7 150ML (SYRINGE) ×2 IMPLANT
TRANSDUCER W/STOPCOCK (MISCELLANEOUS) ×2 IMPLANT
TUBING CIL FLEX 10 FLL-RA (TUBING) ×2 IMPLANT

## 2020-09-18 NOTE — CV Procedure (Signed)
TEE: Anesth Propofol  Barlows valve with bileaflet prolapse Worst segments  Are partial flail of P2 and severe prolapse P3 resulting In severe anteriorly directed MR Mild TR Normal AV EF 60-65%  No LAA thrombus LAE No effusion   See full report in [facility]  Jenkins Rouge MD [facility]

## 2020-09-18 NOTE — Progress Notes (Signed)
  Echocardiogram Echocardiogram Transesophageal has been performed.  Amber Bird 09/18/2020, 10:09 AM

## 2020-09-18 NOTE — Transfer of Care (Signed)
Immediate Anesthesia Transfer of Care Note  Patient: Amber Bird  Procedure(s) Performed: TRANSESOPHAGEAL ECHOCARDIOGRAM (TEE)  Patient Location: Endoscopy Unit  Anesthesia Type:MAC  Level of Consciousness: sedated, patient cooperative and responds to stimulation  Airway & Oxygen Therapy: Patient Spontanous Breathing and Patient connected to nasal cannula oxygen  Post-op Assessment: Report given to RN and Post -op Vital signs reviewed and stable  Post vital signs: Reviewed and stable  Last Vitals:  Vitals Value Taken Time  BP 84/51 09/18/20 1005  Temp    Pulse 62 09/18/20 1007  Resp 17 09/18/20 1007  SpO2 97 % 09/18/20 1007  Vitals shown include unvalidated device data.  Last Pain:  Vitals:   09/18/20 0717  TempSrc:   PainSc: 0-No pain         Complications: No notable events documented.

## 2020-09-18 NOTE — Interval H&P Note (Signed)
History and Physical Interval Note:  09/18/2020 12:51 PM  Amber Bird  has presented today for surgery, with the diagnosis of MR.  The various methods of treatment have been discussed with the patient and family. After consideration of risks, benefits and other options for treatment, the patient has consented to  Procedure(s): RIGHT/LEFT HEART CATH AND CORONARY ANGIOGRAPHY (N/A) as a surgical intervention.  The patient's history has been reviewed, patient examined, no change in status, stable for surgery.  I have reviewed the patient's chart and labs.  Questions were answered to the patient's satisfaction.     Collier [facility] 09/18/2020 12:51 PM

## 2020-09-18 NOTE — Telephone Encounter (Signed)
Called to authorize left heart cath. Spoke with [facility]. At [phone] who states that cath has been authorized and the [place]. # is [id number].

## 2020-09-18 NOTE — Anesthesia Postprocedure Evaluation (Signed)
Anesthesia Post Note  Patient: KASSADIE PANCAKE  Procedure(s) Performed: TRANSESOPHAGEAL ECHOCARDIOGRAM (TEE)     Patient location during evaluation: Endoscopy Anesthesia Type: MAC Level of consciousness: awake and alert Pain management: pain level controlled Vital Signs Assessment: post-procedure vital signs reviewed and stable Respiratory status: spontaneous breathing, nonlabored ventilation, respiratory function stable and patient connected to nasal cannula oxygen Cardiovascular status: stable, blood pressure returned to baseline and bradycardic Postop Assessment: no apparent nausea or vomiting Anesthetic complications: no   No notable events documented.  Last Vitals:  Vitals:   09/18/20 1100 09/18/20 1110  BP: 117/69 113/77  Pulse: (!) 59 (!) 58  Resp: 14 17  Temp:    SpO2: 99% 99%    Last Pain:  Vitals:   09/18/20 1110  TempSrc:   PainSc: 0-No pain                 Catalina Gravel

## 2020-09-18 NOTE — Anesthesia Preprocedure Evaluation (Signed)
Anesthesia Evaluation  Patient identified by MRN, date of birth, ID band Patient awake    Reviewed: Allergy & Precautions, NPO status , Patient's Chart, lab work & pertinent test results  Airway Mallampati: II  TM Distance: >3 FB Neck ROM: Full    Dental  (+) Dental Advisory Given, Upper Dentures, Edentulous Lower   Pulmonary asthma ,    Pulmonary exam normal breath sounds clear to auscultation       Cardiovascular + angina + dysrhythmias + Valvular Problems/Murmurs MVP  Rhythm:Regular Rate:Normal + Systolic murmurs Echo 08/10/60: 1. Left ventricular ejection fraction, by estimation, is 60 to 65%. The  left ventricle has normal function. The left ventricle demonstrates  regional wall motion abnormalities (see scoring diagram/findings for  description). There is mild left ventricular  hypertrophy. Left ventricular diastolic parameters were normal. Mild  anteoseptal wall hypokinesis.  2. Right ventricular systolic function is normal. The right ventricular  size is normal. There is normal pulmonary artery systolic pressure.  3. Left atrial size was severely dilated.  4. Right atrial size was moderately dilated.  5. Posterior leaflet prolapse resulting in eccentric anterior mitral  regurgitation. The eccentricity makes it difficult to asses severity. MV  to AV VTI ratio of 1.4 would suggest moderate to severe MR. Consider TEE.  . The mitral valve is abnormal.  Moderate to severe mitral valve regurgitation.  6. The aortic valve is tricuspid. Aortic valve regurgitation is not  visualized. No aortic stenosis is present.  7. The inferior vena cava is normal in size with greater than 50%  respiratory variability, suggesting right atrial pressure of 3 mmHg.    Neuro/Psych PSYCHIATRIC DISORDERS Depression negative neurological ROS     GI/Hepatic Neg liver ROS, GERD  Medicated and Controlled,  Endo/Other  negative endocrine  ROS  Renal/GU negative Renal ROS     Musculoskeletal  (+) Arthritis ,   Abdominal   Peds  Hematology negative hematology ROS (+)   Anesthesia Other Findings Day of surgery medications reviewed with the patient.  Reproductive/Obstetrics                             Anesthesia Physical Anesthesia Plan  ASA: 3  Anesthesia Plan: MAC   Post-op Pain Management:    Induction: Intravenous  PONV Risk Score and Plan: 2 and Propofol infusion and Treatment may vary due to age or medical condition  Airway Management Planned: Natural Airway  Additional Equipment:   Intra-op Plan:   Post-operative Plan:   Informed Consent: I have reviewed the patients History and Physical, chart, labs and discussed the procedure including the risks, benefits and alternatives for the proposed anesthesia with the patient or authorized representative who has indicated his/her understanding and acceptance.     Dental advisory given  Plan Discussed with: CRNA  Anesthesia Plan Comments:         Anesthesia Quick Evaluation

## 2020-09-18 NOTE — Progress Notes (Signed)
Patient taken to cath lab from endoscopy, all belongings taken with patient.

## 2020-09-18 NOTE — Interval H&P Note (Signed)
History and Physical Interval Note:  09/18/2020 7:26 AM  Amber Bird  has presented today for surgery, with the diagnosis of mitral valve prolapse.  The various methods of treatment have been discussed with the patient and family. After consideration of risks, benefits and other options for treatment, the patient has consented to  Procedure(s): TRANSESOPHAGEAL ECHOCARDIOGRAM (TEE) (N/A) as a surgical intervention.  The patient's history has been reviewed, patient examined, no change in status, stable for surgery.  I have reviewed the patient's chart and labs.  Questions were answered to the patient's satisfaction.     Jenkins Rouge

## 2020-09-19 ENCOUNTER — Other Ambulatory Visit: Payer: Self-pay | Admitting: Physician Assistant

## 2020-09-19 ENCOUNTER — Encounter (HOSPITAL_COMMUNITY): Payer: Self-pay | Admitting: Cardiology

## 2020-09-19 DIAGNOSIS — I340 Nonrheumatic mitral (valve) insufficiency: Secondary | ICD-10-CM

## 2020-09-19 DIAGNOSIS — I341 Nonrheumatic mitral (valve) prolapse: Secondary | ICD-10-CM

## 2020-09-19 DIAGNOSIS — I2510 Atherosclerotic heart disease of native coronary artery without angina pectoris: Secondary | ICD-10-CM | POA: Diagnosis not present

## 2020-09-19 DIAGNOSIS — Z7982 Long term (current) use of aspirin: Secondary | ICD-10-CM | POA: Diagnosis not present

## 2020-09-19 DIAGNOSIS — Z79899 Other long term (current) drug therapy: Secondary | ICD-10-CM | POA: Diagnosis not present

## 2020-09-19 DIAGNOSIS — Z8249 Family history of ischemic heart disease and other diseases of the circulatory system: Secondary | ICD-10-CM | POA: Diagnosis not present

## 2020-09-19 HISTORY — DX: Atherosclerotic heart disease of native coronary artery without angina pectoris: I25.10

## 2020-09-19 MED ORDER — IBUPROFEN 200 MG PO TABS
200.0000 mg | ORAL_TABLET | Freq: Once | ORAL | Status: AC
  Administered 2020-09-19: 200 mg via ORAL
  Filled 2020-09-19: qty 1

## 2020-09-19 NOTE — Progress Notes (Signed)
Progress Note  Patient Name: Amber Bird Date of Encounter: 09/19/2020  [facility] Cardiologist: [name], MD   Subjective   Denies any CP or SOB.   Inpatient Medications    Scheduled Meds:  aspirin EC  81 mg Oral Daily   atorvastatin  20 mg Oral QHS   cholecalciferol  5,000 Units Oral Daily   linaclotide  72 mcg Oral QAC breakfast   loratadine  10 mg Oral Daily   pantoprazole  40 mg Oral Daily   sodium chloride flush  3 mL Intravenous Q12H   Continuous Infusions:  sodium chloride     PRN Meds: sodium chloride, acetaminophen, olopatadine, ondansetron (ZOFRAN) IV, sodium chloride flush   Vital Signs    Vitals:   09/18/20 1629 09/18/20 2026 09/19/20 0021 09/19/20 0358  BP: 112/68 113/66 104/68 108/67  Pulse: 62 64 73 82  Resp: '17 14 18 19  '$ Temp:  98.3 F (36.8 C) 98.3 F (36.8 C) 97.7 F (36.5 C)  TempSrc:  Oral Oral Oral  SpO2: 98% 98% 95% 97%  Weight:      Height:        Intake/Output Summary (Last 24 hours) at 09/19/2020 0808 Last data filed at 09/18/2020 1007 Gross per 24 hour  Intake 400 ml  Output --  Net 400 ml   Last 3 Weights 09/18/2020 09/18/2020 09/18/2020  Weight (lbs) 140 lb 4.8 oz 140 lb 4.8 oz 140 lb 8 oz  Weight (kg) 63.64 kg 63.64 kg 63.73 kg      Telemetry    NSR without significant ventricular ectopy- Personally Reviewed  ECG    NSR without significant ST-T wave changes - Personally Reviewed  Physical Exam   GEN: No acute distress.   Neck: No JVD Cardiac: RRR, no murmurs, rubs, or gallops.  Respiratory: Clear to auscultation bilaterally. GI: Soft, nontender, non-distended  MS: No edema; No deformity. Neuro:  Nonfocal  Psych: Normal affect   Labs    High Sensitivity Troponin:  No results for input(s): TROPONINIHS in the last 720 hours.    Chemistry Recent Labs  Lab 09/18/20 1339 09/18/20 1342  NA 142  142 141  K 3.8  4.0 3.9     Hematology Recent Labs  Lab 09/18/20 1339 09/18/20 1342  HGB 11.6*   12.2 12.2  HCT 34.0*  36.0 36.0    BNPNo results for input(s): BNP, PROBNP in the last 168 hours.   DDimer No results for input(s): DDIMER in the last 168 hours.   Radiology    CARDIAC CATHETERIZATION  Result Date: 09/18/2020   Prox LAD to Mid LAD lesion is 25% stenosed.   The left ventricular systolic function is normal.   LV end diastolic pressure is normal.   The left ventricular ejection fraction is 55-65% by visual estimate.   There is severe (4+) mitral regurgitation.   There is severe (4+) mitral regurgitation and severe mitral valve prolapse. Minimal nonobstructive CAD Severe posterior mitral valve prolapse with severe MR Normal right heart and LV filling pressures Plan: consider referral for MV repair.   ECHO TEE  Result Date: 09/18/2020    TRANSESOPHOGEAL ECHO REPORT   Patient Name:   Amber Bird Date of Exam: 09/18/2020 Medical Rec #:  [id number]         Height:       63.0 in Accession #:    [id number]        Weight:       140.5 lb Date  of Birth:  03-26-1945         BSA:          1.664 m Patient Age:    76 years          BP:           109/80 mmHg Patient Gender: F                 HR:           73 bpm. Exam Location:  Inpatient Procedure: Transesophageal Echo, Cardiac Doppler, Color Doppler and 3D Echo Indications:     I34.1 Nonrheumatic mitral (valve) prolapse  History:         Patient has prior history of Echocardiogram examinations, most                  recent 07/22/2020. Mitral Valve Disease and Mitral Valve                  Prolapse, Signs/Symptoms:Shortness of Breath and Dyspnea; Risk                  Factors:Dyslipidemia. Severe mitral regurgitation.  Sonographer:     Roseanna Rainbow RDCS Referring Phys:  [id number] PAULA V ROSS Diagnosing Phys: Jenkins Rouge MD PROCEDURE: After discussion of the risks and benefits of a TEE, an informed consent was obtained from the patient. The transesophogeal probe was passed without difficulty through the esophogus of the patient. Imaged were obtained  with the patient in a left lateral decubitus position. Sedation performed by different physician. The patient was monitored while under deep sedation. Anesthestetic sedation was provided intravenously by Anesthesiology: '340mg'$  of Propofol. The patient's vital signs; including heart rate, blood pressure, and oxygen saturation; remained stable throughout the procedure. The patient developed no complications during the procedure. IMPRESSIONS  1. Left ventricular ejection fraction, by estimation, is 65 to 70%. The left ventricle has normal function.  2. Right ventricular systolic function is normal. The right ventricular size is normal.  3. Left atrial size was mildly dilated. No left atrial/left atrial appendage thrombus was detected.  4. Extensive 3 D imaging True Barlow's valve with billowing and prolpase of both anterior and posterior leaflets. Worst lesions are partial flail P2 and severe prolapse of P3 resulting in severe ecdentric anteriorly directed MR. Only partial reversal seen in right superior PV. . The mitral valve is myxomatous. Severe mitral valve regurgitation. Severe mitral stenosis.  5. The aortic valve is tricuspid. Aortic valve regurgitation is not visualized.  6. Aortic dilatation noted. There is moderate dilatation of the ascending aorta. FINDINGS  Left Ventricle: Left ventricular ejection fraction, by estimation, is 65 to 70%. The left ventricle has normal function. The left ventricular internal cavity size was normal in size. Right Ventricle: The right ventricular size is normal. No increase in right ventricular wall thickness. Right ventricular systolic function is normal. Left Atrium: Left atrial size was mildly dilated. No left atrial/left atrial appendage thrombus was detected. Right Atrium: Right atrial size was normal in size. Pericardium: There is no evidence of pericardial effusion. Mitral Valve: Extensive 3 D imaging True Barlow's valve with billowing and prolpase of both anterior and  posterior leaflets. Worst lesions are partial flail P2 and severe prolapse of P3 resulting in severe ecdentric anteriorly directed MR. Only partial reversal seen in right superior PV. The mitral valve is myxomatous. Severe mitral valve regurgitation. Severe mitral valve stenosis. Tricuspid Valve: The tricuspid valve is normal in structure. Tricuspid valve regurgitation is  mild. Aortic Valve: The aortic valve is tricuspid. Aortic valve regurgitation is not visualized. Pulmonic Valve: The pulmonic valve was normal in structure. Pulmonic valve regurgitation is mild. Aorta: Aortic dilatation noted. There is moderate dilatation of the ascending aorta. IAS/Shunts: No atrial level shunt detected by color flow Doppler.  MR Peak grad:    144.5 mmHg MR Mean grad:    76.0 mmHg MR Vmax:         601.00 cm/s MR Vmean:        371.0 cm/s MR PISA:         5.09 cm MR PISA Eff ROA: 33 mm MR PISA Radius:  0.90 cm Jenkins Rouge MD Electronically signed by Jenkins Rouge MD Signature Date/Time: 09/18/2020/11:05:15 AM    Final     Cardiac Studies   TEE 8.18.2022  1. Left ventricular ejection fraction, by estimation, is 65 to 70%. The  left ventricle has normal function.   2. Right ventricular systolic function is normal. The right ventricular  size is normal.   3. Left atrial size was mildly dilated. No left atrial/left atrial  appendage thrombus was detected.   4. Extensive 3 D imaging True Barlow's valve with billowing and prolpase  of both anterior and posterior leaflets. Worst lesions are partial flail  P2 and severe prolapse of P3 resulting in severe ecdentric anteriorly  directed MR. Only partial reversal  seen in right superior PV. . The mitral valve is myxomatous. Severe mitral  valve regurgitation. Severe mitral stenosis.   5. The aortic valve is tricuspid. Aortic valve regurgitation is not  visualized.   6. Aortic dilatation noted. There is moderate dilatation of the ascending  aorta.    Cath 09/18/2020      Prox LAD to Mid LAD lesion is 25% stenosed.   The left ventricular systolic function is normal.   LV end diastolic pressure is normal.   The left ventricular ejection fraction is 55-65% by visual estimate.   There is severe (4+) mitral regurgitation.   There is severe (4+) mitral regurgitation and severe mitral valve prolapse.   Minimal nonobstructive CAD Severe posterior mitral valve prolapse with severe MR Normal right heart and LV filling pressures   Plan: consider referral for MV repair.   Patient Profile     75 y.o. female with MVP and MVR referred to cardiology in June as echo showed significant mitral regurgitation. She was also complaining of chest pain as well. Previous echo showed anterior wall motion abnormality, therefore she was admitted for both TEE and cardiac catherization.   Assessment & Plan    Mitral valve regurgitation  - TTE 07/22/2020 EF 60-65%, moderate to severe eccentric anterior mitral regurgitation, consider TEE  - TEE 09/18/2020 showed partial flail P2 and severe prolapse of P3 resulting in severe anteriorly directed MR, EF 60-65%, no LAA thrombus, mild TR, normal AV  - plan to discharge today and refer to CT surgery Dr. Evelina Dun at [facility] to consider mitral valve surgery.   Chest pain  - Cath 09/18/2020 25% prox to mid LAD lesion, LVEDP normal severe 4+ mitral regurgitation and mitral valve prolapse.   HLD  For questions or updates, please contact [place] Please consult [email or web address] for contact info under        Signed, Almyra Deforest, [place]  09/19/2020, 8:08 AM

## 2020-09-19 NOTE — Discharge Summary (Signed)
Discharge Summary    Patient ID: Amber Bird MRN: [id number]; DOB: Oct 09, 1945  Admit date: 09/18/2020 Discharge date: 09/19/2020  PCP:  [name], MD   [facility] Providers Cardiologist:  [name], MD      Discharge Diagnoses    Principal Problem:   Severe mitral insufficiency Active Problems:   Mitral valve prolapse   Nonrheumatic mitral valve regurgitation   CAD (coronary artery disease)    Diagnostic Studies/Procedures    Transesophageal echocardiogram 09/18/2020 IMPRESSIONS     1. Left ventricular ejection fraction, by estimation, is 65 to 70%. The  left ventricle has normal function.   2. Right ventricular systolic function is normal. The right ventricular  size is normal.   3. Left atrial size was mildly dilated. No left atrial/left atrial  appendage thrombus was detected.   4. Extensive 3 D imaging True Barlow's valve with billowing and prolpase  of both anterior and posterior leaflets. Worst lesions are partial flail  P2 and severe prolapse of P3 resulting in severe ecdentric anteriorly  directed MR. Only partial reversal  seen in right superior PV. . The mitral valve is myxomatous. Severe mitral  valve regurgitation. Severe mitral stenosis.   5. The aortic valve is tricuspid. Aortic valve regurgitation is not  visualized.   6. Aortic dilatation noted. There is moderate dilatation of the ascending  aorta.     Cath 09/18/2020   Prox LAD to Mid LAD lesion is 25% stenosed.   The left ventricular systolic function is normal.   LV end diastolic pressure is normal.   The left ventricular ejection fraction is 55-65% by visual estimate.   There is severe (4+) mitral regurgitation.   There is severe (4+) mitral regurgitation and severe mitral valve prolapse.   Minimal nonobstructive CAD Severe posterior mitral valve prolapse with severe MR Normal right heart and LV filling pressures   Plan: consider referral for MV repair.    Diagnostic Dominance: Right      _____________   History of Present Illness     Amber Bird is a 75 y.o. female with a history of  MVP and mitral regurgitaiton.  Pt also with hx remote chest pain   Pain was sharp  L sided.  Occurred on 06/21/20   None since    I saw the pt in JUne 2022 for the first time    She complained of some dyspnea on exertion  She says it started last November 2021   Used to walk a lot   Got SOB now with hills   Echo in late June showed MVP with significant mitral regurgitation   (see below)    LVEF normal      Since seen she says she feels about the same   Remains very active but says she used to be able to do more    Has some DOE Deneis PND   No orthopnea   No palpitaitons   No further CP    Hospital Course     Consultants: N/A   Given possible severe mitral valve regurgitation and prolapse and recent chest discomfort, Dr. Harrington Challenger recommended outpatient TEE and cardiac catheterization.  Transesophageal echocardiogram performed on 09/18/2020 by Dr. Jenkins Rouge revealed EF 65 to 70%, True Barlow's valve was bellowing and prolapse of both anterior and posterior leaflet, worse lesions are partial flail P2 and a severe prolapse of P3 resulting in severe eccentric anteriorly directed MR.  See TEE report above.  Patient has severe mitral  valve regurgitation was severe mitral stenosis.  Subsequent cardiac catheterization performed on 09/18/2020 by Dr. Peter Martinique showed EF 55 to 65%, 25% minimal proximal to mid LAD plaque, no other coronary disease was seen, 4+ severe mitral valve regurgitation with severe mitral valve prolapse.  Patient was seen in the morning of 09/19/2020 at which time she was doing well without any chest pain or worsening dyspnea.  She appears to be euvolemic on exam.  She was instructed not to lift anything greater than 5 pounds for 1 week.  We plan to refer the patient to Dr. Evelina Dun at [facility] to consider mitral valve repair/replacement.    Did  the patient have an acute coronary syndrome (MI, NSTEMI, STEMI, etc) this admission?:  No                               Did the patient have a percutaneous coronary intervention (stent / angioplasty)?:  No.       _____________  Discharge Vitals Blood pressure 117/76, pulse 74, temperature 97.9 F (36.6 C), temperature source Oral, resp. rate 16, height '5\' 3"'$  (1.6 m), weight 63.6 kg, SpO2 96 %.  Filed Weights   09/18/20 0717 09/18/20 1523 09/18/20 1529  Weight: 63.7 kg 63.6 kg 63.6 kg    Labs & Radiologic Studies    CBC Recent Labs    09/18/20 1339 09/18/20 1342  HGB 11.6*  12.2 12.2  HCT 34.0*  36.0 [id number]   Basic Metabolic Panel Recent Labs    09/18/20 1339 09/18/20 1342  NA 142  142 141  K 3.8  4.0 3.9   Liver Function Tests No results for input(s): AST, ALT, ALKPHOS, BILITOT, PROT, ALBUMIN in the last 72 hours. No results for input(s): LIPASE, AMYLASE in the last 72 hours. High Sensitivity Troponin:   No results for input(s): TROPONINIHS in the last 720 hours.  BNP Invalid input(s): POCBNP D-Dimer No results for input(s): DDIMER in the last 72 hours. Hemoglobin A1C No results for input(s): HGBA1C in the last 72 hours. Fasting Lipid Panel No results for input(s): CHOL, HDL, LDLCALC, TRIG, CHOLHDL, LDLDIRECT in the last 72 hours. Thyroid Function Tests No results for input(s): TSH, T4TOTAL, T3FREE, THYROIDAB in the last 72 hours.  Invalid input(s): FREET3 _____________  CARDIAC CATHETERIZATION  Result Date: 09/18/2020   Prox LAD to Mid LAD lesion is 25% stenosed.   The left ventricular systolic function is normal.   LV end diastolic pressure is normal.   The left ventricular ejection fraction is 55-65% by visual estimate.   There is severe (4+) mitral regurgitation.   There is severe (4+) mitral regurgitation and severe mitral valve prolapse. Minimal nonobstructive CAD Severe posterior mitral valve prolapse with severe MR Normal right heart and LV filling  pressures Plan: consider referral for MV repair.   ECHO TEE  Result Date: 09/18/2020    TRANSESOPHOGEAL ECHO REPORT   Patient Name:   Amber Bird Date of Exam: 09/18/2020 Medical Rec #:  [id number]         Height:       63.0 in Accession #:    [id number]        Weight:       140.5 lb Date of Birth:  November 04, 1945         BSA:          1.664 m Patient Age:    75 years  BP:           109/80 mmHg Patient Gender: F                 HR:           73 bpm. Exam Location:  Inpatient Procedure: Transesophageal Echo, Cardiac Doppler, Color Doppler and 3D Echo Indications:     I34.1 Nonrheumatic mitral (valve) prolapse  History:         Patient has prior history of Echocardiogram examinations, most                  recent 07/22/2020. Mitral Valve Disease and Mitral Valve                  Prolapse, Signs/Symptoms:Shortness of Breath and Dyspnea; Risk                  Factors:Dyslipidemia. Severe mitral regurgitation.  Sonographer:     Roseanna Rainbow RDCS Referring Phys:  [id number] PAULA V ROSS Diagnosing Phys: Jenkins Rouge MD PROCEDURE: After discussion of the risks and benefits of a TEE, an informed consent was obtained from the patient. The transesophogeal probe was passed without difficulty through the esophogus of the patient. Imaged were obtained with the patient in a left lateral decubitus position. Sedation performed by different physician. The patient was monitored while under deep sedation. Anesthestetic sedation was provided intravenously by Anesthesiology: '340mg'$  of Propofol. The patient's vital signs; including heart rate, blood pressure, and oxygen saturation; remained stable throughout the procedure. The patient developed no complications during the procedure. IMPRESSIONS  1. Left ventricular ejection fraction, by estimation, is 65 to 70%. The left ventricle has normal function.  2. Right ventricular systolic function is normal. The right ventricular size is normal.  3. Left atrial size was mildly dilated. No left  atrial/left atrial appendage thrombus was detected.  4. Extensive 3 D imaging True Barlow's valve with billowing and prolpase of both anterior and posterior leaflets. Worst lesions are partial flail P2 and severe prolapse of P3 resulting in severe ecdentric anteriorly directed MR. Only partial reversal seen in right superior PV. . The mitral valve is myxomatous. Severe mitral valve regurgitation. Severe mitral stenosis.  5. The aortic valve is tricuspid. Aortic valve regurgitation is not visualized.  6. Aortic dilatation noted. There is moderate dilatation of the ascending aorta. FINDINGS  Left Ventricle: Left ventricular ejection fraction, by estimation, is 65 to 70%. The left ventricle has normal function. The left ventricular internal cavity size was normal in size. Right Ventricle: The right ventricular size is normal. No increase in right ventricular wall thickness. Right ventricular systolic function is normal. Left Atrium: Left atrial size was mildly dilated. No left atrial/left atrial appendage thrombus was detected. Right Atrium: Right atrial size was normal in size. Pericardium: There is no evidence of pericardial effusion. Mitral Valve: Extensive 3 D imaging True Barlow's valve with billowing and prolpase of both anterior and posterior leaflets. Worst lesions are partial flail P2 and severe prolapse of P3 resulting in severe ecdentric anteriorly directed MR. Only partial reversal seen in right superior PV. The mitral valve is myxomatous. Severe mitral valve regurgitation. Severe mitral valve stenosis. Tricuspid Valve: The tricuspid valve is normal in structure. Tricuspid valve regurgitation is mild. Aortic Valve: The aortic valve is tricuspid. Aortic valve regurgitation is not visualized. Pulmonic Valve: The pulmonic valve was normal in structure. Pulmonic valve regurgitation is mild. Aorta: Aortic dilatation noted. There is moderate dilatation of the ascending aorta. IAS/Shunts:  No atrial level shunt  detected by color flow Doppler.  MR Peak grad:    144.5 mmHg MR Mean grad:    76.0 mmHg MR Vmax:         601.00 cm/s MR Vmean:        371.0 cm/s MR PISA:         5.09 cm MR PISA Eff ROA: 33 mm MR PISA Radius:  0.90 cm Jenkins Rouge MD Electronically signed by Jenkins Rouge MD Signature Date/Time: 09/18/2020/11:05:15 AM    Final    Disposition   Pt is being discharged home today in good condition.  Follow-up Plans & Appointments     Follow-up Information     Fay Records, MD Follow up.   Specialty: Cardiology Why: office scheduler will contact you for follow up, please give [place] a call if you do not hear from [place] in 3 business days Contact information: [address] [place] [address] [phone]         Glower, Arliss Journey, MD Follow up.   Specialty: Cardiothoracic Surgery Why: we will refer you to Dr. Evelina Dun at [facility] for mitral valve surgery, you will hear from their scheduler regarding appointment with Dr. Elmarie Shiley information: [address] [place] [phone]                Discharge Instructions     Diet - low sodium heart healthy   Complete by: As directed    Discharge instructions   Complete by: As directed    No lifting over 5 lbs for 1 week. No sexual activity for 1 week. Keep procedure site clean & dry. If you notice increased pain, swelling, bleeding or pus, call/return!  You may shower, but no soaking baths/hot tubs/pools for 1 week.   Increase activity slowly   Complete by: As directed        Discharge Medications   Allergies as of 09/19/2020   No Known Allergies      Medication List     TAKE these medications    aspirin EC 81 MG tablet Take 81 mg by mouth daily. Swallow whole.   atorvastatin 20 MG tablet Commonly known as: LIPITOR Take 1 tablet (20 mg total) by mouth daily. What changed: when to take this   CALCIUM-MAGNESIUM-ZINC PO Take 1 tablet by mouth in the morning, at noon, and at  bedtime.   cetirizine 10 MG tablet Commonly known as: ZYRTEC Take 1 tablet (10 mg total) by mouth daily.   linaclotide 72 MCG capsule Commonly known as: Linzess Take 1 capsule (72 mcg total) by mouth daily before breakfast.   olopatadine 0.1 % ophthalmic solution Commonly known as: PATANOL Place 1 drop into both eyes daily as needed (itchy eyes).   pantoprazole 40 MG tablet Commonly known as: PROTONIX Take 1 tablet (40 mg total) by mouth daily.   Ventolin HFA 108 (90 Base) MCG/ACT inhaler Generic drug: albuterol INHALE 2 PUFFS BY MOUTH EVERY 6 HOURS AS NEEDED FOR SHORTNESS OF BREATH What changed: See the new instructions.   Vitamin D3 125 MCG (5000 UT) Tabs Take 5,000 Units by mouth daily.   zolpidem 10 MG tablet Commonly known as: AMBIEN Take 5 mg by mouth at bedtime.           Outstanding Labs/Studies   N/A  Duration of Discharge Encounter   Greater than 30 minutes including physician time.  Hilbert Corrigan, PA 09/19/2020, 10:34 AM

## 2020-09-22 ENCOUNTER — Telehealth: Payer: Self-pay | Admitting: Internal Medicine

## 2020-09-22 NOTE — Telephone Encounter (Signed)
Patient got a phone call from Dr. Alan Ripper office. She was not sure who called her

## 2020-09-23 NOTE — Telephone Encounter (Signed)
[facility] [place] did not call patient.

## 2020-09-23 NOTE — Telephone Encounter (Signed)
Patient is calling back due to never receiving callback. Advised her of message from [place] at [place] office. Patient states Dr. Harrington Challenger called her directly.

## 2020-09-24 NOTE — Telephone Encounter (Signed)
The patient said Dr. Harrington Challenger called her and left her a voice mail on Monday and she has been trying to get her to call her back ever since. Patient is upset because she says she has to go to [facility] to see if they can perform a heart surgery on her. She has been waiting to talk to Dr. Harrington Challenger about this for more information.  Advised the patient I would forward the message to Dr. Harrington Challenger and her team.

## 2020-09-24 NOTE — Telephone Encounter (Signed)
Pt is following up on a call from Dr. Harrington Challenger that she never received. Please advise pt further

## 2020-09-26 ENCOUNTER — Telehealth: Payer: Self-pay | Admitting: Internal Medicine

## 2020-09-26 NOTE — Telephone Encounter (Signed)
Pt with severe MVP/MR   Needs MV repair  I have contacted [facility] ( Dr Aundra Millet office;  [phone]) Please fax clinic note, cath report, TEE report to his office  Fax number   [phone]   They will contact patient to set up appt

## 2020-09-26 NOTE — Telephone Encounter (Signed)
Will route to our medical records dept to further assist with this.

## 2020-09-29 ENCOUNTER — Telehealth: Payer: Self-pay | Admitting: Licensed Clinical Social Worker

## 2020-09-29 NOTE — Telephone Encounter (Signed)
CSW received referral from Dr Harrington Challenger to contact patient to assist with some options for dog sitting and transportation to [facility] for a procedure as she has no local family. Patient reports that she was told that "the surgery needs to take place sooner than later". Patient shred that she does not have a date of the procedure yet and still unsure about her plans for the dog. "This dog is all I have and don't feel comfortable sending to a kennel". Patient has no local family and states her neighbors are not friendly. Patient states that she found a dog trainer who will take the dog for $40 a day but she is unsure she can afford it. In addition, she has 2 cats at home as well. Patient shared during the conversation that she was married to a man who was a pathological liar and she has a hard time trusting people as a result of the marriage.  Patient plans to meet the trainer tomorrow and explore her options. She will follow up with [facility] and determine when the procedure will be scheduled and return call to [place]. CSW discussed some options through the [facility] [name] to assist with her needs. CSW available as patient needs. Raquel Sarna, [place], [place]

## 2020-10-01 NOTE — Telephone Encounter (Signed)
Notes and reports faxed per MD request.  Confirmed fax number.

## 2020-10-02 NOTE — Telephone Encounter (Signed)
   Pt is calling back,she said per Dr. Aundra Millet office they need the actual images of the test report

## 2020-10-03 NOTE — Telephone Encounter (Signed)
Left detailed message on Amber Bird at Dr. Aundra Millet office VM.  Adv of cath lab fax number: [phone].  Adv to fax a signed release of information there and once received the cath lab can push the images through [place].    Called back to patient and informed her of this as well.

## 2020-10-20 ENCOUNTER — Other Ambulatory Visit: Payer: Self-pay | Admitting: Gastroenterology

## 2020-10-21 ENCOUNTER — Ambulatory Visit: Payer: Medicare HMO | Admitting: Physician Assistant

## 2020-10-21 DIAGNOSIS — I340 Nonrheumatic mitral (valve) insufficiency: Secondary | ICD-10-CM | POA: Diagnosis not present

## 2020-10-22 ENCOUNTER — Telehealth: Payer: Self-pay | Admitting: Internal Medicine

## 2020-10-22 NOTE — Telephone Encounter (Signed)
Rx sent 

## 2020-10-22 NOTE — Telephone Encounter (Signed)
Pt is requesting refills on Linzess 72 mcg to be sent to [facility] in [place]. Pt's last office visit was 11/30/2019.

## 2020-10-22 NOTE — Telephone Encounter (Signed)
Patient took her last linzess today and please send her refill to [facility] in [place] so she will not miss a dose

## 2020-10-22 NOTE — Telephone Encounter (Signed)
Pt has called again today asking about her prescription refill on her Linzess. She is out and is having open heart surgery next month and is trying to get all her meds together. I told her the nurse was aware and the provider would get to it after seeing patients.

## 2020-10-22 NOTE — Telephone Encounter (Signed)
Pt is calling checking on refills on Linzess.

## 2020-10-24 ENCOUNTER — Telehealth: Payer: Self-pay | Admitting: Licensed Clinical Social Worker

## 2020-10-24 NOTE — Telephone Encounter (Signed)
CSW received call from patient to share that she went to [facility] for evaluation for needed surgery. Patient states she was very impressed and is scheduled for her surgery on December 01, 2020. Patient is calling to share that she contacted her Vet to inquire about costs to board her 2 cats and 1 dog and the cost will be $1037.00 for the anticipated 14 day stay/recovery told her by the [facility] staff. Patient states she has no family, no friends and no neighbors that can assist her and that "my pets are all I have in the world". Patient is overwhelmed with her current health and acknowledges that she needs the surgery for improved health but can't afford the costs to board her animals and has no one else. CSW will explore some options through the [employer] and discuss with the [place] office about some reduced costs. Patient grateful for the CSW intervention and will await return call.   CSW contacted [facility] [phone] to inquire about a discount for the boarding due to the circumstances. The Vet office was unable to provide any reduction in fees.   CSW will continue to follow and explore options for assistance. Raquel Sarna, [place], [place]

## 2020-10-30 DIAGNOSIS — Z23 Encounter for immunization: Secondary | ICD-10-CM | POA: Diagnosis not present

## 2020-11-02 NOTE — Progress Notes (Signed)
Cardiology Office Note  Date: 11/03/2020   ID: Amber Bird, DOB [birth year], MRN [id number]  PCP:  [name], MD  Cardiologist:  [name], MD Electrophysiologist:  None   Chief Complaint: 4-week follow-up cardiac catheterization and TEE  History of Present Illness: Amber Bird is a 75 y.o. female with a history of CAD, dyspnea, dysrhythmia, history of ulcers, HLD, multiple thyroid nodules, osteoarthritis, severe mitral insufficiency, GERD.  Dr. Harrington Challenger saw the patient and June 2022.  She complained of some dyspnea on exertion.  She stated it started to prior November.  She stated she used to walk a lot.  She was getting shortness of breath now with walking hills.  Had an echo in late June showing mitral valve prolapse with significant mitral regurgitation with EF normal.  She she had some DOE but no LAD orthopnea, PND, palpitations or chest pain.   Patient was seen by Dr. Al Pimple on 09/18/2020 after hospital admission.  She had a TEE and a subsequent right and left heart catheterization.  TEE revealed severe mitral insufficiency.  Revealing extensive improving and return to Barlow's valve mobility and prolapse of both anterior and posterior leaflets.  Both lesions were partial flail P2 and severe prolapse of P3 resulting in severe eccentric anteriorly directed MR.  Only partially reversible.  Severe mitral regurgitation/severe mitral stenosis.  Cardiac catheterization revealed mild nonobstructive CAD.  See report below.  Recommended she consider Dr. Evelina Dun at [facility] given her disease for mitral valve repair.  Decision was deferred to Dr. Harrington Challenger on surgeon preference.  She is here today for follow-up status post recent cardiac catheterization and transesophageal echocardiogram.  She states she is doing well.  She denies any current anginal symptoms.  She continues with some DOE and fatigue.  She is pending mitral valve repair/replacement at [facility] at the end of October with  cardiothoracic surgery.  She denies any sequelae from TEE or cardiac catheterization.  Her right radial access site is clean and dry without bruising and good pulse.  She denies any orthopnea, PND.  Denies any weight gain.  Denies any CVA or TIA-like symptoms.  She states she does have occasional palpitations which are not bothersome.  Denies any lower extremity edema.  Recent cardiac catheterization revealed nonobstructive disease.  See cardiac cath results below. TEE September 18, 2020 EF 65 to 70%.  LA mildly dilated.  Extensive 3D imaging through the follows valve with billowing and prolapse of both anterior and posterior leaflets.  Worse lesions or partial flail P2 and severe prolapse of P3 resulting in severe eccentric anteriorly directed MR only partially reversible seen in the right side.  PV severe mitral regurgitation and severe mitral stenosis.  Past Medical History:  Diagnosis Date   Anemia    Asthma    CAD (coronary artery disease) 07/22/3084   Complication of anesthesia    "drop in blood pressure years ago after hysterectomy "    Depression    Dyspnea    Dysrhythmia    History of bleeding ulcers    Hypercholesteremia    Multiple thyroid nodules    Osteoarthritis of knees, bilateral 05/23/2019   Seasonal allergies     Past Surgical History:  Procedure Laterality Date   ABDOMINAL HYSTERECTOMY     complete.    BIOPSY  10/02/2018   Procedure: BIOPSY;  Surgeon: Daneil Dolin, MD;  Location: [facility];  Service: Endoscopy;;  gastric   bleeding ulcer     CATARACT EXTRACTION  W/PHACO Left 06/11/2019   Procedure: CATARACT EXTRACTION PHACO AND INTRAOCULAR LENS PLACEMENT (IOC) CDE: 4.63;  Surgeon: Baruch Goldmann, MD;  Location: [facility] ORS;  Service: Ophthalmology;  Laterality: Left;   CATARACT EXTRACTION W/PHACO Right 06/25/2019   Procedure: CATARACT EXTRACTION PHACO AND INTRAOCULAR LENS PLACEMENT RIGHT EYE;  Surgeon: Baruch Goldmann, MD;  Location: [facility] ORS;  Service: Ophthalmology;   Laterality: Right;  CDE: 5.61   CHOLECYSTECTOMY     COLONOSCOPY N/A 12/18/2015   Rourk: diverticulosis in sigmoid colon, otherwise normal    Cyst removed from ovary  1973   dental implants     ESOPHAGOGASTRODUODENOSCOPY (EGD) WITH PROPOFOL N/A 10/02/2018   Procedure: ESOPHAGOGASTRODUODENOSCOPY (EGD) WITH PROPOFOL;  Surgeon: Daneil Dolin, MD;  Location: [facility];  Service: Endoscopy;  Laterality: N/A;  12:45pm   LAPAROSCOPIC CHOLECYSTECTOMY SINGLE SITE WITH INTRAOPERATIVE CHOLANGIOGRAM N/A 05/12/2016   Procedure: LAPAROSCOPIC CHOLECYSTECTOMY SINGLE SITE WITH INTRAOPERATIVE CHOLANGIOGRAM;  Surgeon: Michael Boston, MD;  Location: [facility] OR[facility];  Service: General;  Laterality: N/A;   left hammer toe repair     MALONEY DILATION N/A 10/02/2018   Procedure: Venia Minks DILATION;  Surgeon: Daneil Dolin, MD;  Location: [facility];  Service: Endoscopy;  Laterality: N/A;   RIGHT/LEFT HEART CATH AND CORONARY ANGIOGRAPHY N/A 09/18/2020   Procedure: RIGHT/LEFT HEART CATH AND CORONARY ANGIOGRAPHY;  Surgeon: Martinique, Peter M, MD;  Location: [facility];  Service: Cardiovascular;  Laterality: N/A;   TEE WITHOUT CARDIOVERSION N/A 09/18/2020   Procedure: TRANSESOPHAGEAL ECHOCARDIOGRAM (TEE);  Surgeon: Josue Hector, MD;  Location: [facility] ENDOSCOPY;  Service: Cardiovascular;  Laterality: N/A;   thyroid needle biopsy     VIDEO BRONCHOSCOPY Bilateral 03/21/2017   Did not have done    Current Outpatient Medications  Medication Sig Dispense Refill   aspirin EC 81 MG tablet Take 81 mg by mouth daily. Swallow whole.     atorvastatin (LIPITOR) 20 MG tablet Take 1 tablet (20 mg total) by mouth daily. (Patient taking differently: Take 20 mg by mouth at bedtime.) 90 tablet 1   CALCIUM-MAGNESIUM-ZINC PO Take 1 tablet by mouth in the morning, at noon, and at bedtime.     cetirizine (ZYRTEC) 10 MG tablet Take 1 tablet (10 mg total) by mouth daily. 30 tablet 11   Cholecalciferol (VITAMIN D3) 125 MCG (5000 UT) TABS Take  5,000 Units by mouth daily.     LINZESS 72 MCG capsule TAKE 1 CAPSULE BY MOUTH ONCE DAILY BEFORE BREAKFAST 90 capsule 1   olopatadine (PATANOL) 0.1 % ophthalmic solution Place 1 drop into both eyes daily as needed (itchy eyes).     pantoprazole (PROTONIX) 40 MG tablet Take 1 tablet (40 mg total) by mouth daily. 90 tablet 3   VENTOLIN HFA 108 (90 Base) MCG/ACT inhaler INHALE 2 PUFFS BY MOUTH EVERY 6 HOURS AS NEEDED FOR SHORTNESS OF BREATH (Patient taking differently: Inhale 2 puffs into the lungs every 6 (six) hours as needed for wheezing or shortness of breath.) 18 each 0   zolpidem (AMBIEN) 5 MG tablet Take 5 mg by mouth at bedtime.     No current facility-administered medications for this visit.   Allergies:  Patient has no known allergies.   Social History: The patient  reports that she has never smoked. She has never used smokeless tobacco. She reports that she does not drink alcohol and does not use drugs.   Family History: The patient's family history includes Breast cancer in her mother; Colon cancer in an other family member;  Heart disease in her father; Tuberculosis in her mother.   ROS:  Please see the history of present illness. Otherwise, complete review of systems is positive for none.  All other systems are reviewed and negative.   Physical Exam: VS:  BP 98/62   Pulse 76   Ht 5' 3.5" (1.613 m)   Wt 143 lb 12.8 oz (65.2 kg)   SpO2 98%   BMI 25.07 kg/m , BMI Body mass index is 25.07 kg/m.  Wt Readings from Last 3 Encounters:  11/03/20 143 lb 12.8 oz (65.2 kg)  09/18/20 140 lb 4.8 oz (63.6 kg)  09/03/20 (P) 143 lb (64.9 kg)    General: Patient appears comfortable at rest. Neck: Supple, no elevated JVP or carotid bruits, no thyromegaly. Lungs: Clear to auscultation, nonlabored breathing at rest. Cardiac: Regular rate and rhythm, no S3 or significant systolic murmur, no pericardial rub. Extremities: No pitting edema, distal pulses 2+. Skin: Warm and  dry. Musculoskeletal: No kyphosis. Neuropsychiatric: Alert and oriented x3, affect grossly appropriate.  ECG:  EKG normal sinus rhythm rate of 8320 07/21/2028  Recent Labwork: 08/25/2020: BUN 13; Creatinine, Ser 0.88; Platelets 255 09/18/2020: Hemoglobin 12.2; Potassium 3.9; Sodium 141     Component Value Date/Time   CHOL 154 06/13/2017 1123   TRIG 139 06/13/2017 1123   HDL 54 06/13/2017 1123   CHOLHDL 2.9 06/13/2017 1123   LDLCALC 76 06/13/2017 1123    Other Studies Reviewed Today:   Transesophageal echocardiogram 09/18/2020  1. Left ventricular ejection fraction, by estimation, is 65 to 70%. The  left ventricle has normal function.   2. Right ventricular systolic function is normal. The right ventricular  size is normal.   3. Left atrial size was mildly dilated. No left atrial/left atrial  appendage thrombus was detected.   4. Extensive 3 D imaging True Barlow's valve with billowing and prolpase  of both anterior and posterior leaflets. Worst lesions are partial flail  P2 and severe prolapse of P3 resulting in severe ecdentric anteriorly  directed MR. Only partial reversal  seen in right superior PV. . The mitral valve is myxomatous. Severe mitral  valve regurgitation. Severe mitral stenosis.   5. The aortic valve is tricuspid. Aortic valve regurgitation is not  visualized.   6. Aortic dilatation noted. There is moderate dilatation of the ascending  aorta.        Cath 09/18/2020   Prox LAD to Mid LAD lesion is 25% stenosed.   The left ventricular systolic function is normal.   LV end diastolic pressure is normal.   The left ventricular ejection fraction is 55-65% by visual estimate.   There is severe (4+) mitral regurgitation.   There is severe (4+) mitral regurgitation and severe mitral valve prolapse.   Minimal nonobstructive CAD Severe posterior mitral valve prolapse with severe MR Normal right heart and LV filling pressures   Plan: consider referral for MV  repair.    Diagnostic Dominance: Right        CT 07/16/19     IMPRESSION: 1. Stable chest CT. Spectrum of pulmonary parenchymal findings most compatible with sarcoidosis, including upper lung predominant perilymphatic distribution nodularity and mild fibrosis. 2. Stable mild patchy air trapping in both lungs, indicative of small airways disease, commonly seen with sarcoidosis. 3. Stable dilated main pulmonary artery, suggesting chronic pulmonary arterial hypertension. 4. Stable calcified mediastinal and bilateral hilar adenopathy, compatible with sarcoidosis. 5. Stable heterogeneous 2.6 cm left thyroid nodule. Recommend thyroid US (ref: J Am Coll Radiol. 2015  Feb;12(2): 143-50). 6. One vessel coronary atherosclerosis. 7. Aortic Atherosclerosis (ICD10-I70.0).    Assessment and Plan:  1. Severe mitral valve regurgitation   2. CAD in native artery   3. Mixed hyperlipidemia    1. Severe mitral valve regurgitation Recent TEE with severe mitral regurgitation with stenosis.  See above TEE results.  Patient has pending surgery at the end of October at [facility] with cardiothoracic surgery.  She currently only has mild DOE which can worsen with activity.  Otherwise she denies any anginal symptoms, orthostatic symptoms, weight gain, PND, orthopnea.  2. CAD in native artery Recent cardiac catheterization showed minimal nonobstructive CAD.  See cardiac catheterization above.  Right radial access site looks good with good pulses.  No redness swelling noted.  Continue aspirin 81 mg daily.  3.  Hyperlipidemia Continue atorvastatin 20 mg p.o. daily.  Medication Adjustments/Labs and Tests Ordered: Current medicines are reviewed at length with the patient today.  Concerns regarding medicines are outlined above.   Disposition: Follow-up with Dr. Harrington Challenger or APP 3 months after mitral valve surgery  Signed, Levell July, NP 11/03/2020 3:50 PM    [facility] [address], [place], [place] [address] Phone: [phone]; Fax: [phone]

## 2020-11-03 ENCOUNTER — Ambulatory Visit (INDEPENDENT_AMBULATORY_CARE_PROVIDER_SITE_OTHER): Payer: Medicare HMO | Admitting: Family Medicine

## 2020-11-03 ENCOUNTER — Encounter: Payer: Self-pay | Admitting: Family Medicine

## 2020-11-03 ENCOUNTER — Other Ambulatory Visit: Payer: Self-pay

## 2020-11-03 VITALS — BP 98/62 | HR 76 | Ht 63.5 in | Wt 143.8 lb

## 2020-11-03 DIAGNOSIS — I2510 Atherosclerotic heart disease of native coronary artery without angina pectoris: Secondary | ICD-10-CM | POA: Diagnosis not present

## 2020-11-03 DIAGNOSIS — E782 Mixed hyperlipidemia: Secondary | ICD-10-CM

## 2020-11-03 DIAGNOSIS — I340 Nonrheumatic mitral (valve) insufficiency: Secondary | ICD-10-CM

## 2020-11-03 DIAGNOSIS — Z9889 Other specified postprocedural states: Secondary | ICD-10-CM

## 2020-11-03 NOTE — Patient Instructions (Addendum)
Medication Instructions:   Your physician recommends that you continue on your current medications as directed. Please refer to the Current Medication list given to you today.  Labwork:  none  Testing/Procedures:  none  Follow-Up:  Your physician recommends that you schedule a follow-up appointment in: 3 months.  Any Other Special Instructions Will Be Listed Below (If Applicable).  If you need a refill on your cardiac medications before your next appointment, please call your pharmacy. 

## 2020-11-04 ENCOUNTER — Telehealth (HOSPITAL_COMMUNITY): Payer: Self-pay | Admitting: Licensed Clinical Social Worker

## 2020-11-04 NOTE — Telephone Encounter (Signed)
CSW contacted patient to follow up on care of her pets while she has surgery at [facility] per request of Dr Harrington Challenger. Patient states "I have never been away from my pets" and shared concerns about leaving them at the kennel. Patient stated that she has asked her neighbor to assist with the care of her cats so she would not have the added expense of the kennel for the cats. Patient has a 40 month old puppy and states she will consider putting the dog in the kennel. CSW asked patient about caregiver options for herself post discharge from the hospital and discussed the what ifs she needs further care in a SNF facility which would extend the financial burden of the kennel for the dog. Patient states she has limited household finances and no other support systems. Patient spoke of a niece who lives a distance but "I don't trust her" and has not asked for her to assist with care giving.  CSW suggested patient contact surgeon office to discuss further the options post discharge as she may need a caregiver to go home. Patient considering her options and will reach out to surgeon office. CSW will discuss further with Dr Harrington Challenger. Raquel Sarna, [place], [place]

## 2020-11-13 ENCOUNTER — Telehealth: Payer: Self-pay | Admitting: Licensed Clinical Social Worker

## 2020-11-13 DIAGNOSIS — I340 Nonrheumatic mitral (valve) insufficiency: Secondary | ICD-10-CM

## 2020-11-13 NOTE — Telephone Encounter (Signed)
CSW followed up with patient regarding needs for upcoming surgery at [facility]. Patient spoke with Dr Harrington Challenger and shared that she has the animals covered for care while she is at [facility]. Patient shared frustrations of juggling needs with no support system in place. Patient states she still needs transportation to [facility] on Sunday November 30, 2020 with an arrival time of 11am. CSW discussed a few options and patient will explore with her neighbor the possibility of assistance. Patient appeared to be relived at the end of the conversation and will explore options and return call to [place]. CSW continues to be available for support and assistance with transportation to upcoming surgery. Raquel Sarna, [place], [place]

## 2020-11-14 ENCOUNTER — Telehealth: Payer: Self-pay | Admitting: Licensed Clinical Social Worker

## 2020-11-14 ENCOUNTER — Telehealth: Payer: Self-pay | Admitting: Internal Medicine

## 2020-11-14 NOTE — Telephone Encounter (Signed)
Spoke with pt who will call PCP in regards to PCR test.

## 2020-11-14 NOTE — Telephone Encounter (Signed)
Patient called to share that she was able to move her admission to later in the afternoon which would allow for easier arrangements for transportation. CSW will assist with arrangement for transportation to [facility] on Sunday October 30,2022. CSW continues to follow and will follow up next week. Raquel Sarna, [place], [place]

## 2020-11-14 NOTE — Telephone Encounter (Signed)
Patient called stating that [facility] wants her to have a PCR test done before entering [facility] for upcoming surgery. She is wanting to know if Dr. Harrington Challenger can order this for her.

## 2020-11-17 ENCOUNTER — Telehealth: Payer: Self-pay | Admitting: Licensed Clinical Social Worker

## 2020-11-17 ENCOUNTER — Telehealth: Payer: Self-pay | Admitting: Internal Medicine

## 2020-11-17 NOTE — Telephone Encounter (Signed)
Pt asking if Dr. Harrington Challenger will place order to have covid screening done at [facility]. Pt states that she needs a rapid test done and results in hand when she goes to [facility].

## 2020-11-17 NOTE — Telephone Encounter (Signed)
Please order COVID test

## 2020-11-17 NOTE — Telephone Encounter (Signed)
Patient has questions about the [facility] testing.  Please call [phone] open heart surgery is set for 12-01-2020 at [facility].

## 2020-11-17 NOTE — Telephone Encounter (Signed)
CSW contacted patient to follow up on drop off address and time for transport needs on 10-30. Patient states she still has not had an opportunity to speak with the MD office at [facility] and will contact asap and return call to [place]. Raquel Sarna, [place], [place]

## 2020-11-19 ENCOUNTER — Telehealth: Payer: Self-pay | Admitting: Licensed Clinical Social Worker

## 2020-11-19 NOTE — Telephone Encounter (Signed)
CSW received confirmation call from patient with drop off address for her surgery at [facility]. Patient states she has arrangements for her animals and feels she is in a good place both mentally and physically for the surgery. She is grateful for the support and assistance. CSW available as needed. Raquel Sarna, [place], [place]

## 2020-11-19 NOTE — Telephone Encounter (Signed)
Spoke with pt and she states that she talked to the scheduler at [facility] and they are going to do a rapid Covid screening on the day she is admitted.

## 2020-11-20 ENCOUNTER — Telehealth (HOSPITAL_COMMUNITY): Payer: Self-pay | Admitting: Licensed Clinical Social Worker

## 2020-11-20 DIAGNOSIS — I340 Nonrheumatic mitral (valve) insufficiency: Secondary | ICD-10-CM

## 2020-11-20 NOTE — Telephone Encounter (Signed)
CSW referred to assist with transportation to [facility] for scheudled surgery. CSW made arrangements with [facility] and conformed address and pick up time. Patient grateful for the support. Raquel Sarna, [place], [place]

## 2020-11-28 DIAGNOSIS — J45909 Unspecified asthma, uncomplicated: Secondary | ICD-10-CM | POA: Insufficient documentation

## 2020-11-28 DIAGNOSIS — Z8659 Personal history of other mental and behavioral disorders: Secondary | ICD-10-CM | POA: Insufficient documentation

## 2020-11-28 DIAGNOSIS — I340 Nonrheumatic mitral (valve) insufficiency: Secondary | ICD-10-CM | POA: Insufficient documentation

## 2020-11-28 DIAGNOSIS — K219 Gastro-esophageal reflux disease without esophagitis: Secondary | ICD-10-CM | POA: Insufficient documentation

## 2020-11-28 DIAGNOSIS — E785 Hyperlipidemia, unspecified: Secondary | ICD-10-CM | POA: Insufficient documentation

## 2020-11-28 DIAGNOSIS — F419 Anxiety disorder, unspecified: Secondary | ICD-10-CM | POA: Insufficient documentation

## 2020-11-28 DIAGNOSIS — M1990 Unspecified osteoarthritis, unspecified site: Secondary | ICD-10-CM | POA: Insufficient documentation

## 2020-11-28 HISTORY — DX: Anxiety disorder, unspecified: F41.9

## 2020-11-30 DIAGNOSIS — Z8719 Personal history of other diseases of the digestive system: Secondary | ICD-10-CM | POA: Diagnosis not present

## 2020-11-30 DIAGNOSIS — G4700 Insomnia, unspecified: Secondary | ICD-10-CM | POA: Diagnosis not present

## 2020-11-30 DIAGNOSIS — D649 Anemia, unspecified: Secondary | ICD-10-CM | POA: Diagnosis not present

## 2020-11-30 DIAGNOSIS — I2510 Atherosclerotic heart disease of native coronary artery without angina pectoris: Secondary | ICD-10-CM | POA: Diagnosis not present

## 2020-11-30 DIAGNOSIS — I517 Cardiomegaly: Secondary | ICD-10-CM | POA: Diagnosis not present

## 2020-11-30 DIAGNOSIS — R918 Other nonspecific abnormal finding of lung field: Secondary | ICD-10-CM | POA: Diagnosis not present

## 2020-11-30 DIAGNOSIS — J45909 Unspecified asthma, uncomplicated: Secondary | ICD-10-CM | POA: Diagnosis not present

## 2020-11-30 DIAGNOSIS — J90 Pleural effusion, not elsewhere classified: Secondary | ICD-10-CM | POA: Diagnosis not present

## 2020-11-30 DIAGNOSIS — M1990 Unspecified osteoarthritis, unspecified site: Secondary | ICD-10-CM | POA: Diagnosis not present

## 2020-11-30 DIAGNOSIS — Z9889 Other specified postprocedural states: Secondary | ICD-10-CM | POA: Diagnosis not present

## 2020-11-30 DIAGNOSIS — F419 Anxiety disorder, unspecified: Secondary | ICD-10-CM | POA: Diagnosis not present

## 2020-11-30 DIAGNOSIS — E785 Hyperlipidemia, unspecified: Secondary | ICD-10-CM | POA: Diagnosis not present

## 2020-11-30 DIAGNOSIS — I340 Nonrheumatic mitral (valve) insufficiency: Secondary | ICD-10-CM | POA: Diagnosis not present

## 2020-11-30 DIAGNOSIS — Z20822 Contact with and (suspected) exposure to covid-19: Secondary | ICD-10-CM | POA: Diagnosis not present

## 2020-11-30 DIAGNOSIS — R4701 Aphasia: Secondary | ICD-10-CM | POA: Diagnosis not present

## 2020-11-30 DIAGNOSIS — Z7982 Long term (current) use of aspirin: Secondary | ICD-10-CM | POA: Diagnosis not present

## 2020-11-30 DIAGNOSIS — Z4682 Encounter for fitting and adjustment of non-vascular catheter: Secondary | ICD-10-CM | POA: Diagnosis not present

## 2020-11-30 DIAGNOSIS — I341 Nonrheumatic mitral (valve) prolapse: Secondary | ICD-10-CM | POA: Diagnosis not present

## 2020-11-30 DIAGNOSIS — I498 Other specified cardiac arrhythmias: Secondary | ICD-10-CM | POA: Diagnosis not present

## 2020-11-30 DIAGNOSIS — Z952 Presence of prosthetic heart valve: Secondary | ICD-10-CM | POA: Diagnosis not present

## 2020-11-30 DIAGNOSIS — Z452 Encounter for adjustment and management of vascular access device: Secondary | ICD-10-CM | POA: Diagnosis not present

## 2020-11-30 DIAGNOSIS — K219 Gastro-esophageal reflux disease without esophagitis: Secondary | ICD-10-CM | POA: Diagnosis not present

## 2020-11-30 DIAGNOSIS — I511 Rupture of chordae tendineae, not elsewhere classified: Secondary | ICD-10-CM | POA: Diagnosis not present

## 2020-11-30 DIAGNOSIS — D72829 Elevated white blood cell count, unspecified: Secondary | ICD-10-CM | POA: Diagnosis not present

## 2020-11-30 DIAGNOSIS — I700 Atherosclerosis of aorta: Secondary | ICD-10-CM | POA: Diagnosis not present

## 2020-11-30 DIAGNOSIS — Z79899 Other long term (current) drug therapy: Secondary | ICD-10-CM | POA: Diagnosis not present

## 2020-12-01 DIAGNOSIS — Z9889 Other specified postprocedural states: Secondary | ICD-10-CM | POA: Insufficient documentation

## 2020-12-01 DIAGNOSIS — G8918 Other acute postprocedural pain: Secondary | ICD-10-CM | POA: Insufficient documentation

## 2020-12-05 ENCOUNTER — Telehealth: Payer: Self-pay | Admitting: Licensed Clinical Social Worker

## 2020-12-05 NOTE — Telephone Encounter (Signed)
CSW received call from patient stating all went well with her surgery and was ready to be discharged. Patient was transported to [facility] for surgery by [facility] and requesting assistance with transport home today. CSW notified Alger Simons and arranged transport. Raquel Sarna, [place], [place]

## 2020-12-07 DIAGNOSIS — M1990 Unspecified osteoarthritis, unspecified site: Secondary | ICD-10-CM | POA: Diagnosis not present

## 2020-12-07 DIAGNOSIS — E785 Hyperlipidemia, unspecified: Secondary | ICD-10-CM | POA: Diagnosis not present

## 2020-12-07 DIAGNOSIS — R69 Illness, unspecified: Secondary | ICD-10-CM | POA: Diagnosis not present

## 2020-12-07 DIAGNOSIS — Z952 Presence of prosthetic heart valve: Secondary | ICD-10-CM | POA: Diagnosis not present

## 2020-12-07 DIAGNOSIS — K219 Gastro-esophageal reflux disease without esophagitis: Secondary | ICD-10-CM | POA: Diagnosis not present

## 2020-12-07 DIAGNOSIS — J4520 Mild intermittent asthma, uncomplicated: Secondary | ICD-10-CM | POA: Diagnosis not present

## 2020-12-07 DIAGNOSIS — I700 Atherosclerosis of aorta: Secondary | ICD-10-CM | POA: Diagnosis not present

## 2020-12-07 DIAGNOSIS — I340 Nonrheumatic mitral (valve) insufficiency: Secondary | ICD-10-CM | POA: Diagnosis not present

## 2020-12-07 DIAGNOSIS — Z48812 Encounter for surgical aftercare following surgery on the circulatory system: Secondary | ICD-10-CM | POA: Diagnosis not present

## 2020-12-07 DIAGNOSIS — Z7901 Long term (current) use of anticoagulants: Secondary | ICD-10-CM | POA: Diagnosis not present

## 2020-12-07 DIAGNOSIS — R918 Other nonspecific abnormal finding of lung field: Secondary | ICD-10-CM | POA: Diagnosis not present

## 2020-12-08 DIAGNOSIS — J4520 Mild intermittent asthma, uncomplicated: Secondary | ICD-10-CM | POA: Diagnosis not present

## 2020-12-08 DIAGNOSIS — M1990 Unspecified osteoarthritis, unspecified site: Secondary | ICD-10-CM | POA: Diagnosis not present

## 2020-12-08 DIAGNOSIS — Z7901 Long term (current) use of anticoagulants: Secondary | ICD-10-CM | POA: Diagnosis not present

## 2020-12-08 DIAGNOSIS — R69 Illness, unspecified: Secondary | ICD-10-CM | POA: Diagnosis not present

## 2020-12-08 DIAGNOSIS — R918 Other nonspecific abnormal finding of lung field: Secondary | ICD-10-CM | POA: Diagnosis not present

## 2020-12-08 DIAGNOSIS — Z48812 Encounter for surgical aftercare following surgery on the circulatory system: Secondary | ICD-10-CM | POA: Diagnosis not present

## 2020-12-08 DIAGNOSIS — I700 Atherosclerosis of aorta: Secondary | ICD-10-CM | POA: Diagnosis not present

## 2020-12-08 DIAGNOSIS — E785 Hyperlipidemia, unspecified: Secondary | ICD-10-CM | POA: Diagnosis not present

## 2020-12-08 DIAGNOSIS — K219 Gastro-esophageal reflux disease without esophagitis: Secondary | ICD-10-CM | POA: Diagnosis not present

## 2020-12-08 DIAGNOSIS — Z952 Presence of prosthetic heart valve: Secondary | ICD-10-CM | POA: Diagnosis not present

## 2020-12-09 ENCOUNTER — Telehealth: Payer: Self-pay | Admitting: Internal Medicine

## 2020-12-09 DIAGNOSIS — M1990 Unspecified osteoarthritis, unspecified site: Secondary | ICD-10-CM | POA: Diagnosis not present

## 2020-12-09 DIAGNOSIS — R000 Tachycardia, unspecified: Secondary | ICD-10-CM | POA: Diagnosis not present

## 2020-12-09 DIAGNOSIS — I959 Hypotension, unspecified: Secondary | ICD-10-CM | POA: Diagnosis not present

## 2020-12-09 DIAGNOSIS — J90 Pleural effusion, not elsewhere classified: Secondary | ICD-10-CM | POA: Diagnosis not present

## 2020-12-09 DIAGNOSIS — R918 Other nonspecific abnormal finding of lung field: Secondary | ICD-10-CM | POA: Diagnosis not present

## 2020-12-09 DIAGNOSIS — Z48812 Encounter for surgical aftercare following surgery on the circulatory system: Secondary | ICD-10-CM | POA: Diagnosis not present

## 2020-12-09 DIAGNOSIS — I4891 Unspecified atrial fibrillation: Secondary | ICD-10-CM | POA: Diagnosis not present

## 2020-12-09 DIAGNOSIS — I471 Supraventricular tachycardia: Secondary | ICD-10-CM | POA: Diagnosis not present

## 2020-12-09 DIAGNOSIS — Z952 Presence of prosthetic heart valve: Secondary | ICD-10-CM | POA: Diagnosis not present

## 2020-12-09 DIAGNOSIS — I97820 Postprocedural cerebrovascular infarction during cardiac surgery: Secondary | ICD-10-CM | POA: Diagnosis not present

## 2020-12-09 DIAGNOSIS — I700 Atherosclerosis of aorta: Secondary | ICD-10-CM | POA: Diagnosis not present

## 2020-12-09 DIAGNOSIS — Z7901 Long term (current) use of anticoagulants: Secondary | ICD-10-CM | POA: Diagnosis not present

## 2020-12-09 DIAGNOSIS — I4892 Unspecified atrial flutter: Secondary | ICD-10-CM | POA: Diagnosis not present

## 2020-12-09 DIAGNOSIS — J4520 Mild intermittent asthma, uncomplicated: Secondary | ICD-10-CM | POA: Diagnosis not present

## 2020-12-09 DIAGNOSIS — R778 Other specified abnormalities of plasma proteins: Secondary | ICD-10-CM | POA: Diagnosis not present

## 2020-12-09 DIAGNOSIS — Y831 Surgical operation with implant of artificial internal device as the cause of abnormal reaction of the patient, or of later complication, without mention of misadventure at the time of the procedure: Secondary | ICD-10-CM | POA: Diagnosis not present

## 2020-12-09 DIAGNOSIS — I509 Heart failure, unspecified: Secondary | ICD-10-CM | POA: Diagnosis not present

## 2020-12-09 DIAGNOSIS — K219 Gastro-esophageal reflux disease without esophagitis: Secondary | ICD-10-CM | POA: Diagnosis not present

## 2020-12-09 DIAGNOSIS — R69 Illness, unspecified: Secondary | ICD-10-CM | POA: Diagnosis not present

## 2020-12-09 DIAGNOSIS — E785 Hyperlipidemia, unspecified: Secondary | ICD-10-CM | POA: Diagnosis not present

## 2020-12-09 DIAGNOSIS — R002 Palpitations: Secondary | ICD-10-CM | POA: Diagnosis not present

## 2020-12-09 DIAGNOSIS — R0689 Other abnormalities of breathing: Secondary | ICD-10-CM | POA: Diagnosis not present

## 2020-12-09 DIAGNOSIS — R42 Dizziness and giddiness: Secondary | ICD-10-CM | POA: Diagnosis not present

## 2020-12-09 NOTE — Telephone Encounter (Signed)
Attempted to call Mitzi Hansen, unable to leave voicemail

## 2020-12-09 NOTE — Telephone Encounter (Signed)
  Amber Bird home health PT calling, he said he visited pt today and he had a hard time getting pt's BP, he tried different places on pt but couldn't get an accurate reading, he said the nurse visited pt yesterday and BP was 100/60, he also said pt's pulse is very faint, but he did get the HR of 80-85. Pt is able to sit and converse with him but he is still concern on pt's symptoms

## 2020-12-10 DIAGNOSIS — I4891 Unspecified atrial fibrillation: Secondary | ICD-10-CM | POA: Insufficient documentation

## 2020-12-10 DIAGNOSIS — K219 Gastro-esophageal reflux disease without esophagitis: Secondary | ICD-10-CM | POA: Diagnosis not present

## 2020-12-10 DIAGNOSIS — R9431 Abnormal electrocardiogram [ECG] [EKG]: Secondary | ICD-10-CM | POA: Diagnosis not present

## 2020-12-10 DIAGNOSIS — I38 Endocarditis, valve unspecified: Secondary | ICD-10-CM | POA: Diagnosis not present

## 2020-12-10 DIAGNOSIS — R002 Palpitations: Secondary | ICD-10-CM | POA: Diagnosis not present

## 2020-12-10 DIAGNOSIS — J45909 Unspecified asthma, uncomplicated: Secondary | ICD-10-CM | POA: Diagnosis not present

## 2020-12-10 DIAGNOSIS — J90 Pleural effusion, not elsewhere classified: Secondary | ICD-10-CM | POA: Diagnosis not present

## 2020-12-10 DIAGNOSIS — R42 Dizziness and giddiness: Secondary | ICD-10-CM | POA: Diagnosis not present

## 2020-12-10 DIAGNOSIS — I340 Nonrheumatic mitral (valve) insufficiency: Secondary | ICD-10-CM | POA: Diagnosis not present

## 2020-12-10 DIAGNOSIS — I509 Heart failure, unspecified: Secondary | ICD-10-CM | POA: Diagnosis not present

## 2020-12-10 DIAGNOSIS — Z9049 Acquired absence of other specified parts of digestive tract: Secondary | ICD-10-CM | POA: Diagnosis not present

## 2020-12-10 DIAGNOSIS — I4892 Unspecified atrial flutter: Secondary | ICD-10-CM | POA: Diagnosis not present

## 2020-12-10 DIAGNOSIS — R0602 Shortness of breath: Secondary | ICD-10-CM | POA: Diagnosis not present

## 2020-12-10 DIAGNOSIS — Z9889 Other specified postprocedural states: Secondary | ICD-10-CM | POA: Diagnosis not present

## 2020-12-11 DIAGNOSIS — Z952 Presence of prosthetic heart valve: Secondary | ICD-10-CM | POA: Diagnosis not present

## 2020-12-11 DIAGNOSIS — Z48812 Encounter for surgical aftercare following surgery on the circulatory system: Secondary | ICD-10-CM | POA: Diagnosis not present

## 2020-12-11 DIAGNOSIS — K219 Gastro-esophageal reflux disease without esophagitis: Secondary | ICD-10-CM | POA: Diagnosis not present

## 2020-12-11 DIAGNOSIS — R918 Other nonspecific abnormal finding of lung field: Secondary | ICD-10-CM | POA: Diagnosis not present

## 2020-12-11 DIAGNOSIS — Z7901 Long term (current) use of anticoagulants: Secondary | ICD-10-CM | POA: Diagnosis not present

## 2020-12-11 DIAGNOSIS — R69 Illness, unspecified: Secondary | ICD-10-CM | POA: Diagnosis not present

## 2020-12-11 DIAGNOSIS — M1990 Unspecified osteoarthritis, unspecified site: Secondary | ICD-10-CM | POA: Diagnosis not present

## 2020-12-11 DIAGNOSIS — I700 Atherosclerosis of aorta: Secondary | ICD-10-CM | POA: Diagnosis not present

## 2020-12-11 DIAGNOSIS — J4520 Mild intermittent asthma, uncomplicated: Secondary | ICD-10-CM | POA: Diagnosis not present

## 2020-12-11 DIAGNOSIS — E785 Hyperlipidemia, unspecified: Secondary | ICD-10-CM | POA: Diagnosis not present

## 2020-12-12 NOTE — Telephone Encounter (Signed)
Spoke with Mitzi Hansen who states that pt was transferred via EMS to see cardiac surgeon. Medication adjustments made and pt DC'd to home.

## 2020-12-16 DIAGNOSIS — Z48812 Encounter for surgical aftercare following surgery on the circulatory system: Secondary | ICD-10-CM | POA: Diagnosis not present

## 2020-12-16 DIAGNOSIS — R918 Other nonspecific abnormal finding of lung field: Secondary | ICD-10-CM | POA: Diagnosis not present

## 2020-12-16 DIAGNOSIS — K219 Gastro-esophageal reflux disease without esophagitis: Secondary | ICD-10-CM | POA: Diagnosis not present

## 2020-12-16 DIAGNOSIS — J4520 Mild intermittent asthma, uncomplicated: Secondary | ICD-10-CM | POA: Diagnosis not present

## 2020-12-16 DIAGNOSIS — R69 Illness, unspecified: Secondary | ICD-10-CM | POA: Diagnosis not present

## 2020-12-16 DIAGNOSIS — M1990 Unspecified osteoarthritis, unspecified site: Secondary | ICD-10-CM | POA: Diagnosis not present

## 2020-12-16 DIAGNOSIS — Z7901 Long term (current) use of anticoagulants: Secondary | ICD-10-CM | POA: Diagnosis not present

## 2020-12-16 DIAGNOSIS — Z952 Presence of prosthetic heart valve: Secondary | ICD-10-CM | POA: Diagnosis not present

## 2020-12-16 DIAGNOSIS — I700 Atherosclerosis of aorta: Secondary | ICD-10-CM | POA: Diagnosis not present

## 2020-12-16 DIAGNOSIS — E785 Hyperlipidemia, unspecified: Secondary | ICD-10-CM | POA: Diagnosis not present

## 2020-12-17 ENCOUNTER — Other Ambulatory Visit (HOSPITAL_COMMUNITY): Payer: Self-pay

## 2020-12-17 DIAGNOSIS — Z9889 Other specified postprocedural states: Secondary | ICD-10-CM

## 2020-12-18 DIAGNOSIS — R918 Other nonspecific abnormal finding of lung field: Secondary | ICD-10-CM | POA: Diagnosis not present

## 2020-12-18 DIAGNOSIS — K219 Gastro-esophageal reflux disease without esophagitis: Secondary | ICD-10-CM | POA: Diagnosis not present

## 2020-12-18 DIAGNOSIS — Z48812 Encounter for surgical aftercare following surgery on the circulatory system: Secondary | ICD-10-CM | POA: Diagnosis not present

## 2020-12-18 DIAGNOSIS — E785 Hyperlipidemia, unspecified: Secondary | ICD-10-CM | POA: Diagnosis not present

## 2020-12-18 DIAGNOSIS — R69 Illness, unspecified: Secondary | ICD-10-CM | POA: Diagnosis not present

## 2020-12-18 DIAGNOSIS — Z7901 Long term (current) use of anticoagulants: Secondary | ICD-10-CM | POA: Diagnosis not present

## 2020-12-18 DIAGNOSIS — I700 Atherosclerosis of aorta: Secondary | ICD-10-CM | POA: Diagnosis not present

## 2020-12-18 DIAGNOSIS — M1990 Unspecified osteoarthritis, unspecified site: Secondary | ICD-10-CM | POA: Diagnosis not present

## 2020-12-18 DIAGNOSIS — J4520 Mild intermittent asthma, uncomplicated: Secondary | ICD-10-CM | POA: Diagnosis not present

## 2020-12-18 DIAGNOSIS — Z952 Presence of prosthetic heart valve: Secondary | ICD-10-CM | POA: Diagnosis not present

## 2020-12-22 DIAGNOSIS — I4891 Unspecified atrial fibrillation: Secondary | ICD-10-CM | POA: Diagnosis not present

## 2020-12-22 DIAGNOSIS — Z9889 Other specified postprocedural states: Secondary | ICD-10-CM | POA: Diagnosis not present

## 2020-12-22 DIAGNOSIS — R42 Dizziness and giddiness: Secondary | ICD-10-CM | POA: Diagnosis not present

## 2020-12-22 DIAGNOSIS — R9431 Abnormal electrocardiogram [ECG] [EKG]: Secondary | ICD-10-CM | POA: Diagnosis not present

## 2020-12-22 DIAGNOSIS — J90 Pleural effusion, not elsewhere classified: Secondary | ICD-10-CM | POA: Diagnosis not present

## 2020-12-22 DIAGNOSIS — Z48812 Encounter for surgical aftercare following surgery on the circulatory system: Secondary | ICD-10-CM | POA: Diagnosis not present

## 2020-12-22 DIAGNOSIS — Z79899 Other long term (current) drug therapy: Secondary | ICD-10-CM | POA: Diagnosis not present

## 2020-12-22 DIAGNOSIS — Z7901 Long term (current) use of anticoagulants: Secondary | ICD-10-CM | POA: Diagnosis not present

## 2020-12-23 ENCOUNTER — Telehealth: Payer: Self-pay | Admitting: Internal Medicine

## 2020-12-23 NOTE — Telephone Encounter (Signed)
Tiffany is calling to found out of orders was received that she faxed over to be signed.

## 2020-12-23 NOTE — Telephone Encounter (Signed)
Call returned to [facility]. Tiffany notified that orders were received and faxed to [facility]. To be signed.

## 2020-12-24 DIAGNOSIS — K219 Gastro-esophageal reflux disease without esophagitis: Secondary | ICD-10-CM | POA: Diagnosis not present

## 2020-12-24 DIAGNOSIS — M1990 Unspecified osteoarthritis, unspecified site: Secondary | ICD-10-CM | POA: Diagnosis not present

## 2020-12-24 DIAGNOSIS — Z7901 Long term (current) use of anticoagulants: Secondary | ICD-10-CM | POA: Diagnosis not present

## 2020-12-24 DIAGNOSIS — Z952 Presence of prosthetic heart valve: Secondary | ICD-10-CM | POA: Diagnosis not present

## 2020-12-24 DIAGNOSIS — I700 Atherosclerosis of aorta: Secondary | ICD-10-CM | POA: Diagnosis not present

## 2020-12-24 DIAGNOSIS — R918 Other nonspecific abnormal finding of lung field: Secondary | ICD-10-CM | POA: Diagnosis not present

## 2020-12-24 DIAGNOSIS — E785 Hyperlipidemia, unspecified: Secondary | ICD-10-CM | POA: Diagnosis not present

## 2020-12-24 DIAGNOSIS — Z48812 Encounter for surgical aftercare following surgery on the circulatory system: Secondary | ICD-10-CM | POA: Diagnosis not present

## 2020-12-24 DIAGNOSIS — R69 Illness, unspecified: Secondary | ICD-10-CM | POA: Diagnosis not present

## 2020-12-24 DIAGNOSIS — J4520 Mild intermittent asthma, uncomplicated: Secondary | ICD-10-CM | POA: Diagnosis not present

## 2020-12-30 DIAGNOSIS — E785 Hyperlipidemia, unspecified: Secondary | ICD-10-CM | POA: Diagnosis not present

## 2020-12-30 DIAGNOSIS — J4520 Mild intermittent asthma, uncomplicated: Secondary | ICD-10-CM | POA: Diagnosis not present

## 2020-12-30 DIAGNOSIS — Z7901 Long term (current) use of anticoagulants: Secondary | ICD-10-CM | POA: Diagnosis not present

## 2020-12-30 DIAGNOSIS — M1990 Unspecified osteoarthritis, unspecified site: Secondary | ICD-10-CM | POA: Diagnosis not present

## 2020-12-30 DIAGNOSIS — Z952 Presence of prosthetic heart valve: Secondary | ICD-10-CM | POA: Diagnosis not present

## 2020-12-30 DIAGNOSIS — I700 Atherosclerosis of aorta: Secondary | ICD-10-CM | POA: Diagnosis not present

## 2020-12-30 DIAGNOSIS — R69 Illness, unspecified: Secondary | ICD-10-CM | POA: Diagnosis not present

## 2020-12-30 DIAGNOSIS — K219 Gastro-esophageal reflux disease without esophagitis: Secondary | ICD-10-CM | POA: Diagnosis not present

## 2020-12-30 DIAGNOSIS — Z48812 Encounter for surgical aftercare following surgery on the circulatory system: Secondary | ICD-10-CM | POA: Diagnosis not present

## 2020-12-30 DIAGNOSIS — R918 Other nonspecific abnormal finding of lung field: Secondary | ICD-10-CM | POA: Diagnosis not present

## 2021-01-02 DIAGNOSIS — Z7901 Long term (current) use of anticoagulants: Secondary | ICD-10-CM | POA: Diagnosis not present

## 2021-01-02 DIAGNOSIS — Z48812 Encounter for surgical aftercare following surgery on the circulatory system: Secondary | ICD-10-CM | POA: Diagnosis not present

## 2021-01-02 DIAGNOSIS — K219 Gastro-esophageal reflux disease without esophagitis: Secondary | ICD-10-CM | POA: Diagnosis not present

## 2021-01-02 DIAGNOSIS — E785 Hyperlipidemia, unspecified: Secondary | ICD-10-CM | POA: Diagnosis not present

## 2021-01-02 DIAGNOSIS — R918 Other nonspecific abnormal finding of lung field: Secondary | ICD-10-CM | POA: Diagnosis not present

## 2021-01-02 DIAGNOSIS — Z952 Presence of prosthetic heart valve: Secondary | ICD-10-CM | POA: Diagnosis not present

## 2021-01-02 DIAGNOSIS — I700 Atherosclerosis of aorta: Secondary | ICD-10-CM | POA: Diagnosis not present

## 2021-01-02 DIAGNOSIS — M1990 Unspecified osteoarthritis, unspecified site: Secondary | ICD-10-CM | POA: Diagnosis not present

## 2021-01-02 DIAGNOSIS — R69 Illness, unspecified: Secondary | ICD-10-CM | POA: Diagnosis not present

## 2021-01-02 DIAGNOSIS — J4520 Mild intermittent asthma, uncomplicated: Secondary | ICD-10-CM | POA: Diagnosis not present

## 2021-01-21 ENCOUNTER — Telehealth: Payer: Self-pay | Admitting: Internal Medicine

## 2021-01-21 NOTE — Telephone Encounter (Signed)
° °  [name] with [facility] calling, she said they sent orders to sign by Dr. Harrington Challenger, she said the orders was faxed on 11/06 and again on 11/16. She is following it up

## 2021-01-21 NOTE — Telephone Encounter (Signed)
I was unable to find orders in office.  I spoke with [name] and she will send again

## 2021-01-23 ENCOUNTER — Other Ambulatory Visit (HOSPITAL_COMMUNITY)
Admission: RE | Admit: 2021-01-23 | Discharge: 2021-01-23 | Disposition: A | Discharge status: Home / Self Care | Payer: Medicare HMO | Source: Ambulatory Visit | Attending: Internal Medicine | Admitting: Internal Medicine

## 2021-01-23 ENCOUNTER — Other Ambulatory Visit: Payer: Self-pay

## 2021-01-23 ENCOUNTER — Ambulatory Visit (INDEPENDENT_AMBULATORY_CARE_PROVIDER_SITE_OTHER): Payer: Medicare HMO | Admitting: Internal Medicine

## 2021-01-23 ENCOUNTER — Telehealth: Payer: Self-pay | Admitting: *Deleted

## 2021-01-23 ENCOUNTER — Telehealth: Payer: Self-pay | Admitting: Internal Medicine

## 2021-01-23 ENCOUNTER — Encounter: Payer: Self-pay | Admitting: Internal Medicine

## 2021-01-23 VITALS — BP 116/74 | HR 102 | Ht 63.5 in | Wt 140.2 lb

## 2021-01-23 DIAGNOSIS — Z79899 Other long term (current) drug therapy: Secondary | ICD-10-CM | POA: Diagnosis not present

## 2021-01-23 DIAGNOSIS — E782 Mixed hyperlipidemia: Secondary | ICD-10-CM

## 2021-01-23 LAB — COMPREHENSIVE METABOLIC PANEL
ALT: 23 U/L (ref 0–44)
AST: 26 U/L (ref 15–41)
Albumin: 4.2 g/dL (ref 3.5–5.0)
Alkaline Phosphatase: 73 U/L (ref 38–126)
Anion gap: 9 (ref 5–15)
BUN: 15 mg/dL (ref 8–23)
CO2: 22 mmol/L (ref 22–32)
Calcium: 9.6 mg/dL (ref 8.9–10.3)
Chloride: 106 mmol/L (ref 98–111)
Creatinine, Ser: 0.9 mg/dL (ref 0.44–1.00)
GFR, Estimated: >60 mL/min (ref >60)
Glucose, Bld: 88 mg/dL (ref 70–99)
Potassium: 4.2 mmol/L (ref 3.5–5.1)
Sodium: 137 mmol/L (ref 135–145)
Total Bilirubin: 0.4 mg/dL (ref 0.3–1.2)
Total Protein: 7.3 g/dL (ref 6.5–8.1)

## 2021-01-23 LAB — CBC
HCT: 46.6 % — ABNORMAL HIGH (ref 36.0–46.0)
Hemoglobin: 15.2 g/dL — ABNORMAL HIGH (ref 12.0–15.0)
MCH: 30.8 pg (ref 26.0–34.0)
MCHC: 32.6 g/dL (ref 30.0–36.0)
MCV: 94.3 fL (ref 80.0–100.0)
Platelets: 316 10*3/uL (ref 150–400)
RBC: 4.94 MIL/uL (ref 3.87–5.11)
RDW: 14.3 % (ref 11.5–15.5)
WBC: 5.8 10*3/uL (ref 4.0–10.5)
nRBC: 0 % (ref 0.0–0.2)

## 2021-01-23 LAB — LIPID PANEL
Cholesterol: 228 mg/dL — ABNORMAL HIGH (ref 0–200)
HDL: 67 mg/dL (ref >40)
LDL Cholesterol: 129 mg/dL — ABNORMAL HIGH (ref 0–99)
Total CHOL/HDL Ratio: 3.4 RATIO
Triglycerides: 161 mg/dL — ABNORMAL HIGH (ref <150)
VLDL: 32 mg/dL (ref 0–40)

## 2021-01-23 LAB — TSH: TSH: 2.335 u[IU]/mL (ref 0.350–4.500)

## 2021-01-23 NOTE — Telephone Encounter (Signed)
Spoke with pt who states that after starting Eliquis and Amiodarone she now gets a headache daily. Pt states that she takes Tylenol daily but the headache does not go away. Please advise.

## 2021-01-23 NOTE — Progress Notes (Signed)
Cardiology Office Note   Date:  01/23/2021   ID:  Amber, Bird 1945-04-28, MRN [id number]  PCP:  [name], MD  Cardiologist:   [name], MD   Patient presents for f/u from MV repair      History of Present Illness: Amber Bird is a 75 y.o. female with a history of  MVP and mitral regurgitaiton.  Pt also with hx remote chest pain  I saw the pt in JUne 2022 for the first time    She complained of some dyspnea on exertion    Echo in late June showed MVP with significant mitral regurgitation   (see below)    LVEF normal    TEE done to further evaluate The pt went on to have R/L heart cath and then was sent to [facility] (Dr [name]) for repair   The pt  undwent MV repair (36 mm ring) on 12/01/20.  Discharged on 12/05/20   The pt says for 3 days she felt great  Then  On 11/8 got SOB  She was readmitted for afib wit hRVR   Tx to [facility] to SR and started on Eliquis and Amio   Since d/c she hs done well  Breathing is OK  Rare dizziness with quick standing   No CP  No palpitations Her Temp is 94/95   She is anxious about this    Also complains of frontal HA   Read that it is a SE of drugs   Worried   Takes Tylenol for this   Helps    Current Meds  Medication Sig   acetaminophen (TYLENOL) 500 MG tablet Take 1,500 mg by mouth in the morning.   amiodarone (PACERONE) 200 MG tablet Take 200 mg by mouth in the morning.   atorvastatin (LIPITOR) 20 MG tablet Take 1 tablet (20 mg total) by mouth daily. (Patient taking differently: Take 20 mg by mouth at bedtime.)   cetirizine (ZYRTEC) 10 MG tablet Take 10-20 mg by mouth at bedtime.   Cholecalciferol (VITAMIN D3) 125 MCG (5000 UT) TABS Take 5,000 Units by mouth daily.   ELIQUIS 5 MG TABS tablet Take 5 mg by mouth 2 (two) times daily.   LINZESS 72 MCG capsule TAKE 1 CAPSULE BY MOUTH ONCE DAILY BEFORE BREAKFAST   olopatadine (PATANOL) 0.1 % ophthalmic solution Place 1 drop into both eyes daily as needed (itchy eyes).    pantoprazole (PROTONIX) 40 MG tablet Take 1 tablet (40 mg total) by mouth daily.   triamcinolone (KENALOG) 0.025 % cream Apply 1 application topically daily as needed for rash.   VENTOLIN HFA 108 (90 Base) MCG/ACT inhaler INHALE 2 PUFFS BY MOUTH EVERY 6 HOURS AS NEEDED FOR SHORTNESS OF BREATH   zolpidem (AMBIEN) 5 MG tablet Take 2.5 mg by mouth at bedtime.     Allergies:   Patient has no known allergies.   Past Medical History:  Diagnosis Date   Anemia    Asthma    CAD (coronary artery disease) 9/67/8938   Complication of anesthesia    "drop in blood pressure years ago after hysterectomy "    Depression    Dyspnea    Dysrhythmia    History of bleeding ulcers    Hypercholesteremia    Multiple thyroid nodules    Osteoarthritis of knees, bilateral 05/23/2019   Seasonal allergies     Past Surgical History:  Procedure Laterality Date   ABDOMINAL HYSTERECTOMY     complete.  BIOPSY  10/02/2018   Procedure: BIOPSY;  Surgeon: Daneil Dolin, MD;  [facility];  Service: Endoscopy;;  gastric   bleeding ulcer     CATARACT EXTRACTION W/PHACO Left 06/11/2019   Procedure: CATARACT EXTRACTION PHACO AND INTRAOCULAR LENS PLACEMENT (IOC) CDE: 4.63;  Surgeon: Baruch Goldmann, MD;  Location: [facility];  Service: Ophthalmology;  Laterality: Left;   CATARACT EXTRACTION W/PHACO Right 06/25/2019   Procedure: CATARACT EXTRACTION PHACO AND INTRAOCULAR LENS PLACEMENT RIGHT EYE;  Surgeon: Baruch Goldmann, MD;  Location: [facility];  Service: Ophthalmology;  Laterality: Right;  CDE: 5.61   CHOLECYSTECTOMY     COLONOSCOPY N/A 12/18/2015   Rourk: diverticulosis in sigmoid colon, otherwise normal    Cyst removed from ovary  1973   dental implants     ESOPHAGOGASTRODUODENOSCOPY (EGD) WITH PROPOFOL N/A 10/02/2018   Procedure: ESOPHAGOGASTRODUODENOSCOPY (EGD) WITH PROPOFOL;  Surgeon: Daneil Dolin, MD;  [facility];  Service: Endoscopy;  Laterality: N/A;  12:45pm   LAPAROSCOPIC  CHOLECYSTECTOMY SINGLE SITE WITH INTRAOPERATIVE CHOLANGIOGRAM N/A 05/12/2016   Procedure: LAPAROSCOPIC CHOLECYSTECTOMY SINGLE SITE WITH INTRAOPERATIVE CHOLANGIOGRAM;  Surgeon: Michael Boston, MD;  [facility]: [facility];  Service: General;  Laterality: N/A;   left hammer toe repair     MALONEY DILATION N/A 10/02/2018   Procedure: Venia Minks DILATION;  Surgeon: Daneil Dolin, MD;  [facility];  Service: Endoscopy;  Laterality: N/A;   RIGHT/LEFT HEART CATH AND CORONARY ANGIOGRAPHY N/A 09/18/2020   Procedure: RIGHT/LEFT HEART CATH AND CORONARY ANGIOGRAPHY;  Surgeon: Martinique, Peter M, MD;  Location: [place] [facility];  Service: Cardiovascular;  Laterality: N/A;   TEE WITHOUT CARDIOVERSION N/A 09/18/2020   Procedure: TRANSESOPHAGEAL ECHOCARDIOGRAM (TEE);  Surgeon: Josue Hector, MD;  Location: [facility] ENDOSCOPY;  Service: Cardiovascular;  Laterality: N/A;   thyroid needle biopsy     VIDEO BRONCHOSCOPY Bilateral 03/21/2017   Did not have done     Social History:  The patient  reports that she has never smoked. She has never used smokeless tobacco. She reports that she does not drink alcohol and does not use drugs.   Family History:  The patient's family history includes Breast cancer in her mother; Colon cancer in an other family member; Heart disease in her father; Tuberculosis in her mother.    ROS:  Please see the history of present illness. All other systems are reviewed and  Negative to the above problem except as noted.    PHYSICAL EXAM: VS:  BP 116/74    Pulse (!) 102    Ht 5' 3.5" (1.613 m)    Wt 140 lb 3.2 oz (63.6 kg)    SpO2 97%    BMI 24.45 kg/m   GEN: Well nourished, well developed, in no acute distress  HEENT: normal  Neck: no JVD, carotid bruits Cardiac: RRR;  No murmurs   No LE  edema  Chest   Scars healing well  Respiratory:  clear to auscultation bilaterally,  GI: soft, nontender, nondistended, + BS  No hepatomegaly  MS: no deformity Moving all extremities   Skin: warm and  dry, no rash Neuro:  Strength and sensation are intact Psych: euthymic mood, full affect   EKG:  EKG is Not ordered today.  Cath   09/18/20    Prox LAD to Mid LAD lesion is 25% stenosed.   The left ventricular systolic function is normal.   LV end diastolic pressure is normal.   The left ventricular ejection fraction is 55-65% by  visual estimate.   There is severe (4+) mitral regurgitation.   There is severe (4+) mitral regurgitation and severe mitral valve prolapse.   Minimal nonobstructive CAD Severe posterior mitral valve prolapse with severe MR Normal right heart and LV filling pressures   Plan: consider referral for MV repair.  TEE   09/18/20  eft ventricular ejection fraction, by estimation, is 65 to 70%. The left ventricle has normal function. 1. 2. Right ventricular systolic function is normal. The right ventricular size is normal. Left atrial size was mildly dilated. No left atrial/left atrial appendage thrombus was detected. 3. Extensive 3 D imaging True Barlow's valve with billowing and prolpase of both anterior and posterior leaflets. Worst lesions are partial flail P2 and severe prolapse of P3 resulting in severe ecdentric anteriorly directed MR. Only partial reversal seen in right superior PV. . The mitral valve is myxomatous. Severe mitral valve regurgitation. Severe mitral stenosis. 4. 5. The aortic valve is tricuspid. Aortic valve regurgitation is not visualized. 6. Aortic dilatation noted. There is moderate dilatation of the ascending aorta.  Echo   07/22/20  1. Left ventricular ejection fraction, by estimation, is 60 to 65%. The left ventricle has normal function. The left ventricle demonstrates regional wall motion abnormalities (see scoring diagram/findings for description). There is mild left ventricular hypertrophy. Left ventricular diastolic parameters were normal. Mild anteoseptal wall hypokinesis. 2. Right ventricular systolic function is normal.  The right ventricular size is normal. There is normal pulmonary artery systolic pressure. 3. Left atrial size was severely dilated. 4. Right atrial size was moderately dilated. 5. Posterior leaflet prolapse resulting in eccentric anterior mitral regurgitation. The eccentricity makes it difficult to asses severity. MV to AV VTI ratio of 1.4 would suggest moderate to severe MR. Consider TEE. . The mitral valve is abnormal. Moderate to severe mitral valve regurgitation. 6. The aortic valve is tricuspid. Aortic valve regurgitation is not visualized. No aortic stenosis is present. 7. The inferior vena cava is normal in size with greater than 50% respiratory variability, suggesting right atrial pressure of 3 mmHg. Lipid Panel    Component Value Date/Time   CHOL 154 06/13/2017 1123   TRIG 139 06/13/2017 1123   HDL 54 06/13/2017 1123   CHOLHDL 2.9 06/13/2017 1123   LDLCALC 76 06/13/2017 1123      Wt Readings from Last 3 Encounters:  01/23/21 140 lb 3.2 oz (63.6 kg)  11/03/20 143 lb 12.8 oz (65.2 kg)  09/18/20 140 lb 4.8 oz (63.6 kg)      ASSESSMENT AND PLAN:  1  Mitral regurgitation  Pt is s/p repair at [facility]   Doing well   She did have an echo at [facility] after surgery   LVEF 50 to 55%  No MR  I would reocmm repeating next spring  Keep on current regimen   2  PAF   Post op afib   NOw on amio   Remians in SR    Plan:   Complete 2 months Rx of Amio and then stop    COntinue Eliquis for another month   FOllow up in clinic in Feb to reasessed  3  Low temps   Pt looks good  Will check TSH     4  Lipids   Will check   She is on lipitor   No CAD   5   HCM  Check Vit D  Check CMET    Plan for f/u Feb    Echo late in spring 2023  Current medicines are reviewed at length with the patient today.  The patient does not have concerns regarding medicines.  Signed, [name], MD  01/23/2021 9:28 AM    [address], [place], [place]  [address] Phone: [phone]; Fax: [phone]

## 2021-01-23 NOTE — Telephone Encounter (Signed)
Fay Records, MD     Yes, it is OK to use lotion (lightly) and bar soap   It is healing nicely         Pt notified and verbalized understanding. Pt had no other questions or concerns at this time.

## 2021-01-23 NOTE — Telephone Encounter (Signed)
Spoke with pt who will stay on both medications for now.

## 2021-01-23 NOTE — Patient Instructions (Addendum)
Medication Instructions:   Your physician recommends that you continue on your current medications as directed. Please refer to the Current Medication list given to you today.  *If you need a refill on your cardiac medications before your next appointment, please call your pharmacy*   Lab Work: Your physician recommends that you return for lab work in: Today   If you have labs (blood work) drawn today and your tests are completely normal, you will receive your results only by: MyChart Message (if you have MyChart) OR A paper copy in the mail If you have any lab test that is abnormal or we need to change your treatment, we will call you to review the results.   Testing/Procedures: NONE    Follow-Up: At [facility], you and your health needs are our priority.  As part of our continuing mission to provide you with exceptional heart care, we have created designated Provider Care Teams.  These Care Teams include your primary Cardiologist (physician) and Advanced Practice Providers (APPs -  Physician Assistants and Nurse Practitioners) who all work together to provide you with the care you need, when you need it.  We recommend signing up for the patient portal called "MyChart".  Sign up information is provided on this After Visit Summary.  MyChart is used to connect with patients for Virtual Visits (Telemedicine).  Patients are able to view lab/test results, encounter notes, upcoming appointments, etc.  Non-urgent messages can be sent to your provider as well.   To learn more about what you can do with MyChart, go to [email or web address].    Your next appointment:   2 month(s)  The format for your next appointment:   In Person  Provider:   [name], MD or Bernerd Pho, PA-C    Other Instructions Thank you for choosing [facility]!

## 2021-01-23 NOTE — Telephone Encounter (Signed)
Patient wants to know if she can use lotion on the incision site and if she can go back to using bar soap.

## 2021-01-23 NOTE — Telephone Encounter (Signed)
-----   Message from Fay Records, MD sent at 01/23/2021 12:49 PM EST ----- Thyroid function is normal Electrolytes, kidney function, liver function are normal  CBC is OK   Lipds:   LDL 129  HDL 67  Trig 161  Chol 228    Pt had normal coronary arteries but   I would recomm low dose statin  Do not want to have any intervention in future    She had been on lipitor  Not sure why stopped  Was on lipitor 20     I would resume, at least to 10 mg   F/U lipids in  2 months

## 2021-01-28 ENCOUNTER — Encounter (HOSPITAL_COMMUNITY): Admission: RE | Admit: 2021-01-28 | Payer: Medicare HMO | Source: Ambulatory Visit

## 2021-01-29 ENCOUNTER — Telehealth: Payer: Self-pay | Admitting: Internal Medicine

## 2021-01-29 NOTE — Telephone Encounter (Signed)
Tiffany from [facility] calling about a fax they have been sending since 11/6 about the patient's home health plan of care and have not received anything back. She states they need it faxed as soon as possible. Fax: [phone] Phone: [phone]

## 2021-01-29 NOTE — Telephone Encounter (Signed)
Forms faxed to [facility] office to have signed by Dr. Harrington Challenger and fax back.

## 2021-02-04 LAB — VITAMIN D 1,25 DIHYDROXY
Vitamin D 1, 25 (OH)2 Total: 43 pg/mL
Vitamin D2 1, 25 (OH)2: <10 pg/mL
Vitamin D3 1, 25 (OH)2: 43 pg/mL

## 2021-02-04 NOTE — Telephone Encounter (Signed)
Orders signed and faxed and sent to med rec to be filed in chart.

## 2021-02-04 NOTE — Telephone Encounter (Signed)
Orders received and given to Dr. Harrington Challenger for her review and signature.

## 2021-02-05 ENCOUNTER — Other Ambulatory Visit: Payer: Self-pay | Admitting: Gastroenterology

## 2021-02-05 NOTE — Telephone Encounter (Signed)
Sending in 6 month supply of Linzess. Please let patient know she needs to schedule a follow-up for additional refills.

## 2021-02-06 ENCOUNTER — Encounter: Payer: Self-pay | Admitting: Internal Medicine

## 2021-02-06 NOTE — Telephone Encounter (Signed)
Noted  

## 2021-02-09 ENCOUNTER — Ambulatory Visit: Payer: Medicare HMO | Admitting: Internal Medicine

## 2021-03-11 ENCOUNTER — Ambulatory Visit (INDEPENDENT_AMBULATORY_CARE_PROVIDER_SITE_OTHER): Payer: Medicare HMO | Admitting: Orthopaedic Surgery

## 2021-03-11 ENCOUNTER — Other Ambulatory Visit: Payer: Self-pay

## 2021-03-11 ENCOUNTER — Ambulatory Visit (INDEPENDENT_AMBULATORY_CARE_PROVIDER_SITE_OTHER): Payer: Medicare HMO

## 2021-03-11 ENCOUNTER — Encounter: Payer: Self-pay | Admitting: Orthopaedic Surgery

## 2021-03-11 DIAGNOSIS — G8929 Other chronic pain: Secondary | ICD-10-CM | POA: Diagnosis not present

## 2021-03-11 DIAGNOSIS — M25562 Pain in left knee: Secondary | ICD-10-CM | POA: Diagnosis not present

## 2021-03-11 MED ORDER — METHYLPREDNISOLONE ACETATE 40 MG/ML IJ SUSP
80.0000 mg | INTRAMUSCULAR | Status: AC | PRN
  Administered 2021-03-11: 80 mg via INTRA_ARTICULAR

## 2021-03-11 MED ORDER — LIDOCAINE HCL 1 % IJ SOLN
5.0000 mL | INTRAMUSCULAR | Status: AC | PRN
  Administered 2021-03-11: 5 mL

## 2021-03-11 NOTE — Progress Notes (Addendum)
Office Visit Note   Patient: Amber Bird           Date of Birth: 06-19-1945           MRN: [id number] Visit Date: 03/11/2021              Requested by: [name], MD [place],  [place] [address] PCP: [name], MD   Assessment & Plan: Visit Diagnoses: left knee arthritis  Plan: Pleasant 76 year old woman with history of left knee pain.  She has had several injuries over the years to this knee from working and riding horses.  She actually was doing well.  She had recent heart surgery in October and resumed walking.  She had to walk on uphill slopes on concrete rather than her flat route and yesterday she had increased pain.  She applied heating pad made it better but this morning she could hardly place weight on her knee.  She has never had an injection  Follow-Up Instructions: No follow-ups on file.   Orders:  No orders of the defined types were placed in this encounter.  No orders of the defined types were placed in this encounter.     Procedures: Large Joint Inj: L knee on 03/11/2021 10:34 AM Indications: pain and diagnostic evaluation Details: 25 G 1.5 in needle, anteromedial approach  Arthrogram: No  Medications: 80 mg methylPREDNISolone acetate 40 MG/ML; 5 mL lidocaine 1 % Outcome: tolerated well, no immediate complications Procedure, treatment alternatives, risks and benefits explained, specific risks discussed. Consent was given by the patient.      Clinical Data: No additional findings.   Subjective: Chief Complaint  Patient presents with   Left Knee - Pain  Patient presents today for her left knee. She states that she has been having pain. She walks up hill several times daily. She is not taking anything for pain.    Review of Systems  All other systems reviewed and are negative.   Objective: Vital Signs: There were no vitals taken for this visit.  Physical Exam HENT:     Head: Atraumatic.  Pulmonary:     Effort:  Pulmonary effort is normal.  Neurological:     General: No focal deficit present.  Psychiatric:        Mood and Affect: Mood normal.        Behavior: Behavior normal.    Ortho Exam Examination of her left knee she has no effusion mild soft tissue swelling.  She has grinding with range of motion.  Tenderness over the medial lateral joint line no erythema good varus valgus stability Specialty Comments:  No specialty comments available.  Imaging: No results found.   PMFS History: Patient Active Problem List   Diagnosis Date Noted   CAD (coronary artery disease) 09/19/2020   Severe mitral insufficiency 09/18/2020   Mitral valve prolapse 09/18/2020   Nonrheumatic mitral valve regurgitation 09/18/2020   Constipation 06/20/2019   Osteoarthritis of knees, bilateral 05/23/2019   Odynophagia 10/02/2018   Observation after surgery 10/02/2018   Esophageal dysphagia    Gastroesophageal reflux disease    Chronic cholecystitis with calculus s/p lap cholecystectomy 05/12/2016 05/12/2016   History of bleeding ulcers    Multiple thyroid nodules    Seasonal allergies    Lower abdominal pain 01/18/2016   Past Medical History:  Diagnosis Date   Anemia    Asthma    CAD (coronary artery disease) 9/48/0165   Complication of anesthesia    "drop in  blood pressure years ago after hysterectomy "    Depression    Dyspnea    Dysrhythmia    History of bleeding ulcers    Hypercholesteremia    Multiple thyroid nodules    Osteoarthritis of knees, bilateral 05/23/2019   Seasonal allergies     Family History  Problem Relation Age of Onset   Colon cancer Other    Breast cancer Mother    Tuberculosis Mother    Heart disease Father     Past Surgical History:  Procedure Laterality Date   ABDOMINAL HYSTERECTOMY     complete.    BIOPSY  10/02/2018   Procedure: BIOPSY;  Surgeon: Daneil Dolin, MD;  [facility];  Service: Endoscopy;;  gastric   bleeding ulcer     CATARACT  EXTRACTION W/PHACO Left 06/11/2019   Procedure: CATARACT EXTRACTION PHACO AND INTRAOCULAR LENS PLACEMENT (IOC) CDE: 4.63;  Surgeon: Baruch Goldmann, MD;  Location[facility];  Service: Ophthalmology;  Laterality: Left;   CATARACT EXTRACTION W/PHACO Right 06/25/2019   Procedure: CATARACT EXTRACTION PHACO AND INTRAOCULAR LENS PLACEMENT RIGHT EYE;  Surgeon: Baruch Goldmann, MD;  Location: [facility];  Service: Ophthalmology;  Laterality: Right;  CDE: 5.61   CHOLECYSTECTOMY     COLONOSCOPY N/A 12/18/2015   Rourk: diverticulosis in sigmoid colon, otherwise normal    Cyst removed from ovary  1973   dental implants     ESOPHAGOGASTRODUODENOSCOPY (EGD) WITH PROPOFOL N/A 10/02/2018   Procedure: ESOPHAGOGASTRODUODENOSCOPY (EGD) WITH PROPOFOL;  Surgeon: Daneil Dolin, MD;  [facility];  Service: Endoscopy;  Laterality: N/A;  12:45pm   LAPAROSCOPIC CHOLECYSTECTOMY SINGLE SITE WITH INTRAOPERATIVE CHOLANGIOGRAM N/A 05/12/2016   Procedure: LAPAROSCOPIC CHOLECYSTECTOMY SINGLE SITE WITH INTRAOPERATIVE CHOLANGIOGRAM;  Surgeon: Michael Boston, MD;  [facility];  Service: General;  Laterality: N/A;   left hammer toe repair     MALONEY DILATION N/A 10/02/2018   Procedure: Venia Minks DILATION;  Surgeon: Daneil Dolin, MD;  [facility];  Service: Endoscopy;  Laterality: N/A;   RIGHT/LEFT HEART CATH AND CORONARY ANGIOGRAPHY N/A 09/18/2020   Procedure: RIGHT/LEFT HEART CATH AND CORONARY ANGIOGRAPHY;  Surgeon: Martinique, Peter M, MD;  Location: [place] CV LAB;  Service: Cardiovascular;  Laterality: N/A;   TEE WITHOUT CARDIOVERSION N/A 09/18/2020   Procedure: TRANSESOPHAGEAL ECHOCARDIOGRAM (TEE);  Surgeon: Josue Hector, MD;  Location: [facility] ENDOSCOPY;  Service: Cardiovascular;  Laterality: N/A;   thyroid needle biopsy     VIDEO BRONCHOSCOPY Bilateral 03/21/2017   Did not have done   Social History   Occupational History   Not on file  Tobacco Use   Smoking status: Never   Smokeless tobacco:  Never  Vaping Use   Vaping Use: Never used  Substance and Sexual Activity   Alcohol use: No   Drug use: No   Sexual activity: Not Currently    Birth control/protection: None

## 2021-03-25 ENCOUNTER — Encounter: Payer: Self-pay | Admitting: Orthopaedic Surgery

## 2021-03-25 ENCOUNTER — Ambulatory Visit (INDEPENDENT_AMBULATORY_CARE_PROVIDER_SITE_OTHER): Payer: Medicare HMO

## 2021-03-25 ENCOUNTER — Other Ambulatory Visit: Payer: Self-pay

## 2021-03-25 ENCOUNTER — Ambulatory Visit (INDEPENDENT_AMBULATORY_CARE_PROVIDER_SITE_OTHER): Payer: Medicare HMO | Admitting: Orthopaedic Surgery

## 2021-03-25 DIAGNOSIS — M545 Low back pain, unspecified: Secondary | ICD-10-CM

## 2021-03-25 DIAGNOSIS — G8929 Other chronic pain: Secondary | ICD-10-CM | POA: Diagnosis not present

## 2021-03-25 NOTE — Progress Notes (Signed)
Office Visit Note History of recurrent low back pain  Patient: Amber Bird           Date of Birth: 04-Dec-1945           MRN: [id number] Visit Date: 03/25/2021              Requested by: [name], MD [address] [place],  [place] [address] PCP: [name], MD   Assessment & Plan: Visit Diagnoses:  1. Chronic right-sided low back pain, unspecified whether sciatica present   2. Chronic bilateral low back pain without sciatica     Plan: Amber Bird has a chronic history of low back pain dating back to age 41 when she fell from a horse.  She will have an occasion where her "back goes out".  She associates this with diffuse stiffness and soreness particularly on the right side and seems to be worse when she sits or when she walks.  She seems to be better when she is lying down.  The pain is episodic without Pacific etiology at this point.  She has not had any bowel or bladder changes and denies any numbness or tingling or referred pain to either lower extremity.  She will take an occasional ibuprofen but finds like heat will make more of a difference.  This has not been evaluated in many many years and certainly not since she was living in [place] years ago.  She does have significant degenerative changes throughout her lumbar spine.  She does not exhibit any sciatica symptoms.  She has good strength in both lower extremities.  After much discussion I think a course of physical therapy would be helpful.  We could always obtain an MRI scan of her lumbar spine but without radiculopathy or neurologic deficits I think we can hold.  I do not think she has a compression fracture we will plan to reevaluate after she has had a course of therapy.  All questions were answered  Follow-Up Instructions: Return if symptoms worsen or fail to improve.   Orders:  Orders Placed This Encounter  Procedures   XR Lumbar Spine 2-3 Views   No orders of the defined types were placed in this  encounter.     Procedures: No procedures performed   Clinical Data: No additional findings.   Subjective: Chief Complaint  Patient presents with   Lower Back - Pain   Left Knee - Follow-up  Patient presents today for follow up on her left knee. She was here two weeks ago and received a cortisone injection in her left knee. She said that it helped "a little bit". Her main concern today is her lower back. She has a history of riding horses for 40years and has taken multiple falls in the past. She states that she back hurts on and off. She uses a heating pad for relief. No pain, numbness, or tingling in her legs.  Pain started possibly at age 68 when she was thrown from a horse.  She notes that her pain resolved after that time but in her 30s and 30s she would have occasional episodes where her back would "go out.  She did have a cute onset of pain mostly on the right side it would only seem to be worse when she would sit or walk.  She has never had any bowel or bladder changes.  Heat and occasional ibuprofen seems to make a difference.  HPI  Review of Systems   Objective:  Vital Signs: There were no vitals taken for this visit.  Physical Exam Constitutional:      Appearance: She is well-developed.  Eyes:     Pupils: Pupils are equal, round, and reactive to light.  Pulmonary:     Effort: Pulmonary effort is normal.  Skin:    General: Skin is warm and dry.  Neurological:     Mental Status: She is alert and oriented to person, place, and time.  Psychiatric:        Behavior: Behavior normal.    Ortho Exam awake alert and oriented x3.  Comfortable sitting.  No acute distress.  No use of ambulatory aid.  Today she was relatively asymptomatic and did not have any percussible tenderness of the lumbar spine or over either SI joint.  Painless range of motion both hips.  No pain in the right knee just a few areas of tenderness in the left knee which was recently evaluated motor and  sensory exam appears to be intact.  No pain over the sacrum  Specialty Comments:  No specialty comments available.  Imaging: XR Lumbar Spine 2-3 Views  Result Date: 03/25/2021 Films lumbar spine were obtained in 2 projections.  There is about a 10 degree left-sided long scoliotic curve probably degenerative in nature..  Diffuse decreased bone mineralization.  Diffuse facet sclerosis throughout the lumbar spine.  There is loss of the normal lumbar lordotic ptosis.  Also loss of the disc space at L4-5 and L5-S1 with slight listhesis of L4 on L5.  Minimal impression of superior endplate of L2 that appears to be chronic.    PMFS History: Patient Active Problem List   Diagnosis Date Noted   Low back pain 03/25/2021   CAD (coronary artery disease) 09/19/2020   Severe mitral insufficiency 09/18/2020   Mitral valve prolapse 09/18/2020   Nonrheumatic mitral valve regurgitation 09/18/2020   Constipation 06/20/2019   Osteoarthritis of knees, bilateral 05/23/2019   Odynophagia 10/02/2018   Observation after surgery 10/02/2018   Esophageal dysphagia    Gastroesophageal reflux disease    Chronic cholecystitis with calculus s/p lap cholecystectomy 05/12/2016 05/12/2016   History of bleeding ulcers    Multiple thyroid nodules    Seasonal allergies    Lower abdominal pain 01/18/2016   Past Medical History:  Diagnosis Date   Anemia    Asthma    CAD (coronary artery disease) 0/62/6948   Complication of anesthesia    "drop in blood pressure years ago after hysterectomy "    Depression    Dyspnea    Dysrhythmia    History of bleeding ulcers    Hypercholesteremia    Multiple thyroid nodules    Osteoarthritis of knees, bilateral 05/23/2019   Seasonal allergies     Family History  Problem Relation Age of Onset   Colon cancer Other    Breast cancer Mother    Tuberculosis Mother    Heart disease Father     Past Surgical History:  Procedure Laterality Date   ABDOMINAL HYSTERECTOMY      complete.    BIOPSY  10/02/2018   Procedure: BIOPSY;  Surgeon: Daneil Dolin, MD;  [facility];  Service: Endoscopy;;  gastric   bleeding ulcer     CATARACT EXTRACTION W/PHACO Left 06/11/2019   Procedure: CATARACT EXTRACTION PHACO AND INTRAOCULAR LENS PLACEMENT (IOC) CDE: 4.63;  Surgeon: Baruch Goldmann, MD;  Location: [facility];  Service: Ophthalmology;  Laterality: Left;   CATARACT EXTRACTION W/PHACO Right 06/25/2019   Procedure:  CATARACT EXTRACTION PHACO AND INTRAOCULAR LENS PLACEMENT RIGHT EYE;  Surgeon: Baruch Goldmann, MD;  Location: [facility];  Service: Ophthalmology;  Laterality: Right;  CDE: 5.61   CHOLECYSTECTOMY     COLONOSCOPY N/A 12/18/2015   Rourk: diverticulosis in sigmoid colon, otherwise normal    Cyst removed from ovary  1973   dental implants     ESOPHAGOGASTRODUODENOSCOPY (EGD) WITH PROPOFOL N/A 10/02/2018   Procedure: ESOPHAGOGASTRODUODENOSCOPY (EGD) WITH PROPOFOL;  Surgeon: Daneil Dolin, MD;  [facility];  Service: Endoscopy;  Laterality: N/A;  12:45pm   LAPAROSCOPIC CHOLECYSTECTOMY SINGLE SITE WITH INTRAOPERATIVE CHOLANGIOGRAM N/A 05/12/2016   Procedure: LAPAROSCOPIC CHOLECYSTECTOMY SINGLE SITE WITH INTRAOPERATIVE CHOLANGIOGRAM;  Surgeon: Michael Boston, MD;  [facility];  Service: General;  Laterality: N/A;   left hammer toe repair     MALONEY DILATION N/A 10/02/2018   Procedure: Venia Minks DILATION;  Surgeon: Daneil Dolin, MD;  [facility];  Service: Endoscopy;  Laterality: N/A;   RIGHT/LEFT HEART CATH AND CORONARY ANGIOGRAPHY N/A 09/18/2020   Procedure: RIGHT/LEFT HEART CATH AND CORONARY ANGIOGRAPHY;  Surgeon: Martinique, Bryla Burek M, MD;  Location: [facility];  Service: Cardiovascular;  Laterality: N/A;   TEE WITHOUT CARDIOVERSION N/A 09/18/2020   Procedure: TRANSESOPHAGEAL ECHOCARDIOGRAM (TEE);  Surgeon: Josue Hector, MD;  Location: [facility] ENDOSCOPY;  Service: Cardiovascular;  Laterality: N/A;   thyroid needle biopsy     VIDEO  BRONCHOSCOPY Bilateral 03/21/2017   Did not have done   Social History   Occupational History   Not on file  Tobacco Use   Smoking status: Never   Smokeless tobacco: Never  Vaping Use   Vaping Use: Never used  Substance and Sexual Activity   Alcohol use: No   Drug use: No   Sexual activity: Not Currently    Birth control/protection: None

## 2021-03-27 ENCOUNTER — Other Ambulatory Visit (HOSPITAL_COMMUNITY)
Admission: RE | Admit: 2021-03-27 | Discharge: 2021-03-27 | Disposition: A | Discharge status: Home / Self Care | Payer: Medicare HMO | Source: Ambulatory Visit | Attending: Internal Medicine | Admitting: Internal Medicine

## 2021-03-27 ENCOUNTER — Other Ambulatory Visit: Payer: Self-pay

## 2021-03-27 ENCOUNTER — Encounter: Payer: Self-pay | Admitting: Internal Medicine

## 2021-03-27 ENCOUNTER — Ambulatory Visit (INDEPENDENT_AMBULATORY_CARE_PROVIDER_SITE_OTHER): Payer: Medicare HMO | Admitting: Internal Medicine

## 2021-03-27 VITALS — BP 118/80 | HR 92 | Ht 63.5 in | Wt 140.0 lb

## 2021-03-27 DIAGNOSIS — Z79899 Other long term (current) drug therapy: Secondary | ICD-10-CM | POA: Diagnosis not present

## 2021-03-27 DIAGNOSIS — E782 Mixed hyperlipidemia: Secondary | ICD-10-CM

## 2021-03-27 DIAGNOSIS — Z9889 Other specified postprocedural states: Secondary | ICD-10-CM | POA: Diagnosis not present

## 2021-03-27 LAB — CBC
HCT: 42.8 % (ref 36.0–46.0)
Hemoglobin: 14.2 g/dL (ref 12.0–15.0)
MCH: 31.2 pg (ref 26.0–34.0)
MCHC: 33.2 g/dL (ref 30.0–36.0)
MCV: 94.1 fL (ref 80.0–100.0)
Platelets: 256 10*3/uL (ref 150–400)
RBC: 4.55 MIL/uL (ref 3.87–5.11)
RDW: 13.7 % (ref 11.5–15.5)
WBC: 6.1 10*3/uL (ref 4.0–10.5)
nRBC: 0 % (ref 0.0–0.2)

## 2021-03-27 LAB — LIPID PANEL
Cholesterol: 156 mg/dL (ref 0–200)
HDL: 62 mg/dL (ref >40)
LDL Cholesterol: 72 mg/dL (ref 0–99)
Total CHOL/HDL Ratio: 2.5 RATIO
Triglycerides: 108 mg/dL (ref <150)
VLDL: 22 mg/dL (ref 0–40)

## 2021-03-27 MED ORDER — ASPIRIN EC 81 MG PO TBEC: 81.0000 mg | DELAYED_RELEASE_TABLET | Freq: Every day | ORAL | 3 refills | Status: AC

## 2021-03-27 NOTE — Addendum Note (Signed)
Addended by: Lendon Collar on: 03/27/2021 08:32 AM   Modules accepted: Orders

## 2021-03-27 NOTE — Patient Instructions (Signed)
Medication Instructions:   Stop Taking Eliquis  Start Taking Aspirin 81 mg   *If you need a refill on your cardiac medications before your next appointment, please call your pharmacy*   Lab Work: Your physician recommends that you return for lab work in: Today   If you have labs (blood work) drawn today and your tests are completely normal, you will receive your results only by: [place] (if you have MyChart) OR A paper copy in the mail If you have any lab test that is abnormal or we need to change your treatment, we will call you to review the results.   Testing/Procedures: Your physician has requested that you have an echocardiogram. Echocardiography is a painless test that uses sound waves to create images of your heart. It provides your doctor with information about the size and shape of your heart and how well your hearts chambers and valves are working. This procedure takes approximately one hour. There are no restrictions for this procedure.    Follow-Up: At [facility], you and your health needs are our priority.  As part of our continuing mission to provide you with exceptional heart care, we have created designated Provider Care Teams.  These Care Teams include your primary Cardiologist (physician) and Advanced Practice Providers (APPs -  Physician Assistants and Nurse Practitioners) who all work together to provide you with the care you need, when you need it.  We recommend signing up for the patient portal called "MyChart".  Sign up information is provided on this After Visit Summary.  MyChart is used to connect with patients for Virtual Visits (Telemedicine).  Patients are able to view lab/test results, encounter notes, upcoming appointments, etc.  Non-urgent messages can be sent to your provider as well.   To learn more about what you can do with MyChart, go to [email or web address].    Your next appointment:    September   The format for your next  appointment:   In Person  Provider:   [name], MD    Other Instructions Thank you for choosing [facility]!

## 2021-03-27 NOTE — Progress Notes (Signed)
Cardiology Office Note   Date:  03/27/2021   ID:  Amber, Bird 1945/10/24, MRN [id number]  PCP:  [name], MD  Cardiologist:   [name], MD   Patient presents for f/u from MV repair      History of Present Illness: Amber Bird is a 76 y.o. female with a history of  MVP and mitral regurgitaiton.  Pt also with hx remote chest pain  I saw the pt in JUne 2022 for the first time    She complained of some dyspnea on exertion   Echo showed MVP with significant mitral regurgitation   (see below)    LVEF normal    TEE done to further evaluate The pt went on to have R/L heart cath and then was sent to [facility] (Dr [name]) for repair on 12/01/20 (36 mm Ring)  She was discharged on 12/05/20   The pt says for 3 days she felt great  Then  On 11/8 got SOB  She was readmitted for afib wit hRVR   Tx to [facility] to SR and started on Eliquis and Amio I saw her in Nov   Doing good  Temp a little low at home Recomm stop amio and then Eliquis in the following months  Pt returns today for f/u  She says that on Jan 9 and 10 she had a couple episodes of chst pain , present when she woke up Low sternal   Like someone pressng on chest   Not severe    Didn't go away  When sitting it was  better     On 15 and 17 had same thing   This time occurred during day while sitting     Since Jan 17 she has had no further spells     The pt says her breathing is [employer] dog a few times per day   [place]    Doing better      Still fatigued    Says her temp is low at times     Breakfast:   Oatmeal with prunes and chia seeds  1/2 cup skim mik   Coffee Lunch    Chicken salad/bread/romaine Dinner   Cod or chicken and brocolli/potatoes Water  2 small squares of chocolate   2 cookies  Oreos     Lipids in Dec LDL 129     Current Meds  Medication Sig   atorvastatin (LIPITOR) 20 MG tablet Take 1 tablet (20 mg total) by mouth daily. (Patient taking differently: Take 20 mg by mouth at bedtime.)    cetirizine (ZYRTEC) 10 MG tablet Take 10-20 mg by mouth at bedtime.   Cholecalciferol (VITAMIN D3) 125 MCG (5000 UT) TABS Take 5,000 Units by mouth daily.   LINZESS 72 MCG capsule TAKE 1 CAPSULE BY MOUTH ONCE DAILY BEFORE BREAKFAST   olopatadine (PATANOL) 0.1 % ophthalmic solution Place 1 drop into both eyes daily as needed (itchy eyes).   pantoprazole (PROTONIX) 40 MG tablet Take 1 tablet (40 mg total) by mouth daily.   triamcinolone (KENALOG) 0.025 % cream Apply 1 application topically daily as needed for rash.   VENTOLIN HFA 108 (90 Base) MCG/ACT inhaler INHALE 2 PUFFS BY MOUTH EVERY 6 HOURS AS NEEDED FOR SHORTNESS OF BREATH   zolpidem (AMBIEN) 5 MG tablet Take 2.5 mg by mouth at bedtime.     Allergies:   Patient has no known allergies.   Past Medical History:  Diagnosis  Date   Anemia    Asthma    CAD (coronary artery disease) 02/25/5807   Complication of anesthesia    "drop in blood pressure years ago after hysterectomy "    Depression    Dyspnea    Dysrhythmia    History of bleeding ulcers    Hypercholesteremia    Multiple thyroid nodules    Osteoarthritis of knees, bilateral 05/23/2019   Seasonal allergies     Past Surgical History:  Procedure Laterality Date   ABDOMINAL HYSTERECTOMY     complete.    BIOPSY  10/02/2018   Procedure: BIOPSY;  Surgeon: Daneil Dolin, MD;  [facility];  Service: Endoscopy;;  gastric   bleeding ulcer     CATARACT EXTRACTION W/PHACO Left 06/11/2019   Procedure: CATARACT EXTRACTION PHACO AND INTRAOCULAR LENS PLACEMENT (IOC) CDE: 4.63;  Surgeon: Baruch Goldmann, MD;  [facility];  Service: Ophthalmology;  Laterality: Left;   CATARACT EXTRACTION W/PHACO Right 06/25/2019   Procedure: CATARACT EXTRACTION PHACO AND INTRAOCULAR LENS PLACEMENT RIGHT EYE;  Surgeon: Baruch Goldmann, MD;  [facility];  Service: Ophthalmology;  Laterality: Right;  CDE: 5.61   CHOLECYSTECTOMY     COLONOSCOPY N/A 12/18/2015   Rourk: diverticulosis in  sigmoid colon, otherwise normal    Cyst removed from ovary  1973   dental implants     ESOPHAGOGASTRODUODENOSCOPY (EGD) WITH PROPOFOL N/A 10/02/2018   Procedure: ESOPHAGOGASTRODUODENOSCOPY (EGD) WITH PROPOFOL;  Surgeon: Daneil Dolin, MD;  [facility];  Service: Endoscopy;  Laterality: N/A;  12:45pm   LAPAROSCOPIC CHOLECYSTECTOMY SINGLE SITE WITH INTRAOPERATIVE CHOLANGIOGRAM N/A 05/12/2016   Procedure: LAPAROSCOPIC CHOLECYSTECTOMY SINGLE SITE WITH INTRAOPERATIVE CHOLANGIOGRAM;  Surgeon: Michael Boston, MD;  [facility];  Service: General;  Laterality: N/A;   left hammer toe repair     MALONEY DILATION N/A 10/02/2018   Procedure: Venia Minks DILATION;  Surgeon: Daneil Dolin, MD;  [facility];  Service: Endoscopy;  Laterality: N/A;   RIGHT/LEFT HEART CATH AND CORONARY ANGIOGRAPHY N/A 09/18/2020   Procedure: RIGHT/LEFT HEART CATH AND CORONARY ANGIOGRAPHY;  Surgeon: Martinique, Peter M, MD;  Location: [facility];  Service: Cardiovascular;  Laterality: N/A;   TEE WITHOUT CARDIOVERSION N/A 09/18/2020   Procedure: TRANSESOPHAGEAL ECHOCARDIOGRAM (TEE);  Surgeon: Josue Hector, MD;  Location: [facility] ENDOSCOPY;  Service: Cardiovascular;  Laterality: N/A;   thyroid needle biopsy     VIDEO BRONCHOSCOPY Bilateral 03/21/2017   Did not have done     Social History:  The patient  reports that she has never smoked. She has never used smokeless tobacco. She reports that she does not drink alcohol and does not use drugs.   Family History:  The patient's family history includes Breast cancer in her mother; Colon cancer in an other family member; Heart disease in her father; Tuberculosis in her mother.    ROS:  Please see the history of present illness. All other systems are reviewed and  Negative to the above problem except as noted.    PHYSICAL EXAM: VS:  BP 118/80    Pulse 92    Ht 5' 3.5" (1.613 m)    Wt 140 lb (63.5 kg)    SpO2 94%    BMI 24.41 kg/m   GEN: Well nourished,  well developed, in no acute distress  HEENT: normal  Neck: no JVD, carotid bruits Cardiac: RRR;  No murmurs   No LE  edema  Respiratory:  clear to auscultation bilaterally,  GI: soft,  nontender, nondistended, + BS  No hepatomegaly  MS: no deformity Moving all extremities   Skin: warm and dry, no rash Neuro:  Strength and sensation are intact Psych: euthymic mood, full affect   EKG:  EKG is Not ordered today.  Cath   09/18/20    Prox LAD to Mid LAD lesion is 25% stenosed.   The left ventricular systolic function is normal.   LV end diastolic pressure is normal.   The left ventricular ejection fraction is 55-65% by visual estimate.   There is severe (4+) mitral regurgitation.   There is severe (4+) mitral regurgitation and severe mitral valve prolapse.   Minimal nonobstructive CAD Severe posterior mitral valve prolapse with severe MR Normal right heart and LV filling pressures   Plan: consider referral for MV repair.  TEE   09/18/20  eft ventricular ejection fraction, by estimation, is 65 to 70%. The left ventricle has normal function. 1. 2. Right ventricular systolic function is normal. The right ventricular size is normal. Left atrial size was mildly dilated. No left atrial/left atrial appendage thrombus was detected. 3. Extensive 3 D imaging True Barlow's valve with billowing and prolpase of both anterior and posterior leaflets. Worst lesions are partial flail P2 and severe prolapse of P3 resulting in severe ecdentric anteriorly directed MR. Only partial reversal seen in right superior PV. . The mitral valve is myxomatous. Severe mitral valve regurgitation. Severe mitral stenosis. 4. 5. The aortic valve is tricuspid. Aortic valve regurgitation is not visualized. 6. Aortic dilatation noted. There is moderate dilatation of the ascending aorta.  Echo   07/22/20  1. Left ventricular ejection fraction, by estimation, is 60 to 65%. The left ventricle has normal function. The  left ventricle demonstrates regional wall motion abnormalities (see scoring diagram/findings for description). There is mild left ventricular hypertrophy. Left ventricular diastolic parameters were normal. Mild anteoseptal wall hypokinesis. 2. Right ventricular systolic function is normal. The right ventricular size is normal. There is normal pulmonary artery systolic pressure. 3. Left atrial size was severely dilated. 4. Right atrial size was moderately dilated. 5. Posterior leaflet prolapse resulting in eccentric anterior mitral regurgitation. The eccentricity makes it difficult to asses severity. MV to AV VTI ratio of 1.4 would suggest moderate to severe MR. Consider TEE. . The mitral valve is abnormal. Moderate to severe mitral valve regurgitation. 6. The aortic valve is tricuspid. Aortic valve regurgitation is not visualized. No aortic stenosis is present. 7. The inferior vena cava is normal in size with greater than 50% respiratory variability, suggesting right atrial pressure of 3 mmHg. Lipid Panel    Component Value Date/Time   CHOL 228 (H) 01/23/2021 1029   TRIG 161 (H) 01/23/2021 1029   HDL 67 01/23/2021 1029   CHOLHDL 3.4 01/23/2021 1029   VLDL 32 01/23/2021 1029   LDLCALC 129 (H) 01/23/2021 1029   LDLCALC 76 06/13/2017 1123      Wt Readings from Last 3 Encounters:  03/27/21 140 lb (63.5 kg)  01/23/21 140 lb 3.2 oz (63.6 kg)  11/03/20 143 lb 12.8 oz (65.2 kg)      ASSESSMENT AND PLAN:  1  Mitral regurgitation  Pt is s/p repair at [facility]  Excellent   No murmur    She did have an echo at [facility] after surgery   LVEF 50 to 55%  No MR  I would reocmm echo t ohave in system Keep on current regimen   EcASA 81 mg    2  PAF  No recurrence of afib clinincally   Off amio   Stop Eliquis    3  Low temps  Thyroid normal    Follow    4  Lipids   Will check  today on current regimen  Plan for f/u later this spring     Current medicines are reviewed at length with the patient  today.  The patient does not have concerns regarding medicines.  Signed, [name], MD  03/27/2021 11:00 AM    [address], [address] [place], [place]  [address] Phone: [phone]; Fax: [phone]

## 2021-04-03 DIAGNOSIS — R29898 Other symptoms and signs involving the musculoskeletal system: Secondary | ICD-10-CM | POA: Diagnosis not present

## 2021-04-03 DIAGNOSIS — M545 Low back pain, unspecified: Secondary | ICD-10-CM | POA: Diagnosis not present

## 2021-04-03 DIAGNOSIS — M2569 Stiffness of other specified joint, not elsewhere classified: Secondary | ICD-10-CM | POA: Diagnosis not present

## 2021-04-03 DIAGNOSIS — R2689 Other abnormalities of gait and mobility: Secondary | ICD-10-CM | POA: Diagnosis not present

## 2021-04-03 DIAGNOSIS — G8929 Other chronic pain: Secondary | ICD-10-CM | POA: Diagnosis not present

## 2021-04-08 DIAGNOSIS — M2569 Stiffness of other specified joint, not elsewhere classified: Secondary | ICD-10-CM | POA: Diagnosis not present

## 2021-04-08 DIAGNOSIS — G8929 Other chronic pain: Secondary | ICD-10-CM | POA: Diagnosis not present

## 2021-04-08 DIAGNOSIS — M545 Low back pain, unspecified: Secondary | ICD-10-CM | POA: Diagnosis not present

## 2021-04-08 DIAGNOSIS — R2689 Other abnormalities of gait and mobility: Secondary | ICD-10-CM | POA: Diagnosis not present

## 2021-04-08 DIAGNOSIS — R29898 Other symptoms and signs involving the musculoskeletal system: Secondary | ICD-10-CM | POA: Diagnosis not present

## 2021-04-14 DIAGNOSIS — M545 Low back pain, unspecified: Secondary | ICD-10-CM | POA: Diagnosis not present

## 2021-04-14 DIAGNOSIS — M2569 Stiffness of other specified joint, not elsewhere classified: Secondary | ICD-10-CM | POA: Diagnosis not present

## 2021-04-14 DIAGNOSIS — R29898 Other symptoms and signs involving the musculoskeletal system: Secondary | ICD-10-CM | POA: Diagnosis not present

## 2021-04-14 DIAGNOSIS — R2689 Other abnormalities of gait and mobility: Secondary | ICD-10-CM | POA: Diagnosis not present

## 2021-04-14 DIAGNOSIS — G8929 Other chronic pain: Secondary | ICD-10-CM | POA: Diagnosis not present

## 2021-04-21 ENCOUNTER — Ambulatory Visit (HOSPITAL_COMMUNITY)
Admission: RE | Admit: 2021-04-21 | Discharge: 2021-04-21 | Disposition: A | Discharge status: Home / Self Care | Payer: Medicare HMO | Source: Ambulatory Visit | Attending: Internal Medicine | Admitting: Internal Medicine

## 2021-04-21 DIAGNOSIS — E785 Hyperlipidemia, unspecified: Secondary | ICD-10-CM | POA: Insufficient documentation

## 2021-04-21 DIAGNOSIS — I4891 Unspecified atrial fibrillation: Secondary | ICD-10-CM | POA: Diagnosis not present

## 2021-04-21 DIAGNOSIS — Z954 Presence of other heart-valve replacement: Secondary | ICD-10-CM | POA: Diagnosis not present

## 2021-04-21 DIAGNOSIS — Z9889 Other specified postprocedural states: Secondary | ICD-10-CM | POA: Insufficient documentation

## 2021-04-21 DIAGNOSIS — I2510 Atherosclerotic heart disease of native coronary artery without angina pectoris: Secondary | ICD-10-CM | POA: Diagnosis not present

## 2021-04-21 LAB — ECHOCARDIOGRAM COMPLETE
Area-P 1/2: 3.08 cm2
MV VTI: 1.1 cm2
S' Lateral: 2.6 cm

## 2021-04-21 NOTE — Progress Notes (Signed)
*  PRELIMINARY RESULTS* ?Echocardiogram ?2D Echocardiogram has been performed. ? ?Amber Bird ?04/21/2021, 9:41 AM ?

## 2021-04-22 ENCOUNTER — Telehealth: Payer: Self-pay

## 2021-04-22 NOTE — Telephone Encounter (Signed)
-----   Message from Stephani Police, RN sent at 04/22/2021  1:29 PM EDT ----- ? ?----- Message ----- ?From: Fay Records, MD ?Sent: 04/21/2021   9:58 PM EDT ?To: Stephani Police, RN ? ?Echo looks good    ?LV function is normal ? Mitral valve looks good   Ovreall excellent repair   ? ?

## 2021-04-22 NOTE — Telephone Encounter (Signed)
Patient notified and verbalized understanding. Pt had no questions or concerns at this time 

## 2021-06-24 ENCOUNTER — Ambulatory Visit (INDEPENDENT_AMBULATORY_CARE_PROVIDER_SITE_OTHER): Payer: Medicare HMO | Admitting: Gastroenterology

## 2021-06-24 ENCOUNTER — Encounter: Payer: Self-pay | Admitting: Internal Medicine

## 2021-06-24 ENCOUNTER — Encounter: Payer: Self-pay | Admitting: Gastroenterology

## 2021-06-24 VITALS — BP 119/73 | HR 105 | Temp 97.3°F | Ht 63.5 in | Wt 143.2 lb

## 2021-06-24 DIAGNOSIS — K219 Gastro-esophageal reflux disease without esophagitis: Secondary | ICD-10-CM

## 2021-06-24 DIAGNOSIS — K6289 Other specified diseases of anus and rectum: Secondary | ICD-10-CM

## 2021-06-24 DIAGNOSIS — K5900 Constipation, unspecified: Secondary | ICD-10-CM

## 2021-06-24 NOTE — Progress Notes (Signed)
GI Office Note    Referring Provider: [name], MD Primary Care Physician:  [name], MD  Primary Gastroenterologist: Garfield Cornea, MD   Chief Complaint   Chief Complaint  Patient presents with   Follow-up    States that she has been having some rectal pain, some bleeding and has been having to use hemorrhoid cream.     History of Present Illness   Amber Bird is a 76 y.o. female presenting today for follow up/refills. Last seen in 11/2019. History of constipation, treated with Linzess. Gerd well controlled on pantoprazole. Last colonoscopy in 2017 with diverticulosis, no plans for future screening colonoscopy due to age.  EGD 2020, mild Schatzki ring status post dilation, small hiatal hernia, bleeding erosive gastropathy with gastritis on biopsy, no H. pylori.  Has been trying to eat healthy, wants to get down to 130 pounds. Walking 4 miles per day. For past couple of months she has been having some rectal pain off/on. She thought it was dietary, has been eating a lot of apple pie, PBJ sandwiches. Denies constipation. She has hugh BMs followed by smaller ones after Linzess. She started using hemorrhoid cream. Denies hard stools. Started back on heathy diet. Rectal pain resolved about four weeks ago.   She we last saw patient she has had mitral valve repair in 11/2020. She had postop Afib. No longer on Eliquis.   Medications   Current Outpatient Medications  Medication Sig Dispense Refill   aspirin EC 81 MG tablet Take 1 tablet (81 mg total) by mouth daily. Swallow whole. 90 tablet 3   atorvastatin (LIPITOR) 40 MG tablet Take 40 mg by mouth daily.     cetirizine (ZYRTEC) 10 MG tablet Take 10-20 mg by mouth at bedtime.     Cholecalciferol (VITAMIN D3) 125 MCG (5000 UT) TABS Take 5,000 Units by mouth daily.     LINZESS 72 MCG capsule TAKE 1 CAPSULE BY MOUTH ONCE DAILY BEFORE BREAKFAST 90 capsule 1   pantoprazole (PROTONIX) 40 MG tablet Take 1 tablet (40 mg  total) by mouth daily. 90 tablet 3   VENTOLIN HFA 108 (90 Base) MCG/ACT inhaler INHALE 2 PUFFS BY MOUTH EVERY 6 HOURS AS NEEDED FOR SHORTNESS OF BREATH 18 each 0   zolpidem (AMBIEN) 5 MG tablet Take 2.5 mg by mouth at bedtime.     No current facility-administered medications for this visit.    Allergies   Allergies as of 06/24/2021   (No Known Allergies)        Review of Systems   General: Negative for anorexia, weight loss, fever, chills, fatigue, weakness. ENT: Negative for hoarseness, difficulty swallowing , nasal congestion. CV: Negative for chest pain, angina, palpitations, dyspnea on exertion, peripheral edema.  Respiratory: Negative for dyspnea at rest, dyspnea on exertion, cough, sputum, wheezing.  GI: See history of present illness. GU:  Negative for dysuria, hematuria, urinary incontinence, urinary frequency, nocturnal urination.  Endo: Negative for unusual weight change.     Physical Exam   BP 119/73 (BP Location: Right Arm, Patient Position: Sitting, Cuff Size: Normal)   Pulse (!) 105   Temp (!) 97.3 F (36.3 C) (Temporal)   Ht 5' 3.5" (1.613 m)   Wt 143 lb 3.2 oz (65 kg)   SpO2 99%   BMI 24.97 kg/m    General: Well-nourished, well-developed in no acute distress.  Eyes: No icterus. Mouth: Oropharyngeal mucosa moist and pink , no lesions erythema or exudate. Lungs: Clear to auscultation bilaterally.  Heart: Regular rate and rhythm, no murmurs rubs or gallops.  Abdomen: Bowel sounds are normal, nontender, nondistended, no hepatosplenomegaly or masses,  no abdominal bruits or hernia , no rebound or guarding.  Rectal: small palpable ?hemorrhoid. No mass. No fissure.  Extremities: No lower extremity edema. No clubbing or deformities. Neuro: Alert and oriented x 4   Skin: Warm and dry, no jaundice.   Psych: Alert and cooperative, normal mood and affect.  Labs   Lab Results  Component Value Date   CREATININE 0.90 01/23/2021   BUN 15 01/23/2021   NA 137  01/23/2021   K 4.2 01/23/2021   CL 106 01/23/2021   CO2 22 01/23/2021   Lab Results  Component Value Date   WBC 6.1 03/27/2021   HGB 14.2 03/27/2021   HCT 42.8 03/27/2021   MCV 94.1 03/27/2021   PLT 256 03/27/2021   Lab Results  Component Value Date   ALT 23 01/23/2021   AST 26 01/23/2021   ALKPHOS 73 01/23/2021   BILITOT 0.4 01/23/2021    Imaging Studies   No results found.  Assessment   GERD: doing well on pantoprazole. Prefer to minimize medication if tolerated.   Constipation: does well on low dose Linzess.  Rectal pain/rectal bleeding: occurred several weeks ago. Suspect hemorrhoid related or benign anorectal source. Symptoms now resolved. Monitor closely, if any recurrent symptoms, would consider updating colonoscopy.   PLAN   Monitor for recurrent rectal pain.  Continue Linzess 44mg daily. Continue pantoprazole '40mg'$  but decrease to every other day.  Continue high fiber diet.  Return to the office in six months.    LLaureen Ochs [facility]ump[facility]

## 2021-06-24 NOTE — Patient Instructions (Signed)
Monitor for any recurrent rectal pain. If you have further issues, please call our office.  Continue Linzess 93mg daily. Continue pantoprazole but drop back to every other day.  Continue to eat high fiber diet. Avoid straining for bowel movements.  Return visit in six months.

## 2021-07-03 ENCOUNTER — Encounter: Payer: Self-pay | Admitting: Gastroenterology

## 2021-08-26 DIAGNOSIS — R55 Syncope and collapse: Secondary | ICD-10-CM | POA: Diagnosis not present

## 2021-08-26 DIAGNOSIS — K219 Gastro-esophageal reflux disease without esophagitis: Secondary | ICD-10-CM | POA: Diagnosis not present

## 2021-08-26 DIAGNOSIS — J069 Acute upper respiratory infection, unspecified: Secondary | ICD-10-CM | POA: Diagnosis not present

## 2021-08-26 DIAGNOSIS — R6883 Chills (without fever): Secondary | ICD-10-CM | POA: Diagnosis not present

## 2021-08-26 DIAGNOSIS — U071 COVID-19: Secondary | ICD-10-CM | POA: Diagnosis not present

## 2021-09-10 ENCOUNTER — Ambulatory Visit (INDEPENDENT_AMBULATORY_CARE_PROVIDER_SITE_OTHER): Payer: Medicare HMO

## 2021-09-10 ENCOUNTER — Encounter: Payer: Self-pay | Admitting: Nurse Practitioner

## 2021-09-10 ENCOUNTER — Ambulatory Visit (INDEPENDENT_AMBULATORY_CARE_PROVIDER_SITE_OTHER): Payer: Medicare HMO | Admitting: Nurse Practitioner

## 2021-09-10 VITALS — BP 118/78 | HR 91 | Ht 63.5 in | Wt 141.8 lb

## 2021-09-10 DIAGNOSIS — I340 Nonrheumatic mitral (valve) insufficiency: Secondary | ICD-10-CM

## 2021-09-10 DIAGNOSIS — Z9889 Other specified postprocedural states: Secondary | ICD-10-CM | POA: Diagnosis not present

## 2021-09-10 DIAGNOSIS — I493 Ventricular premature depolarization: Secondary | ICD-10-CM | POA: Diagnosis not present

## 2021-09-10 DIAGNOSIS — R531 Weakness: Secondary | ICD-10-CM | POA: Diagnosis not present

## 2021-09-10 DIAGNOSIS — R002 Palpitations: Secondary | ICD-10-CM | POA: Diagnosis not present

## 2021-09-10 DIAGNOSIS — I2510 Atherosclerotic heart disease of native coronary artery without angina pectoris: Secondary | ICD-10-CM

## 2021-09-10 NOTE — Progress Notes (Unsigned)
Enrolled for [employer] to mail a ZIO XT long term holter monitor to the patients address on file.   Dr. Ross to read. 

## 2021-09-10 NOTE — Progress Notes (Signed)
Cardiology Office Note:    Date:  09/10/2021   ID:  A[facility], DOB Apr 09, 1945, MRN [id number]  PCP:  [name], MD   [facility] Providers Cardiologist:  [name], MD     Referring MD: [name], MD   Chief Complaint: weakness  History of Present Illness:    Amber Bird is a pleasant 76 y.o. female with a hx of MVP and mitral regurgitation s/p repair with 36 mm ring at [facility] 12/01/20. She established care with Dr. Harrington Challenger June 2022.  Reports following surgery she felt great for about 3 days then became very short of breath on 12/09/20 and was readmitted to [facility] for a fib with RVR,, converted to SR on amiodarone and placed on Eliquis.  She reported couple episodes of chest pain that occurred in January 2023 low sternal area like someone pressing on her chest, not severe.  No clinical observation of recurrence of A-fib.  She had stopped amiodarone and Eliquis.  She was last seen in our office by Dr. Harrington Challenger on 03/27/2021.  She had follow-up echocardiogram which showed LVEF low normal 50 to 55%, indeterminate diastolic parameters, normal RV, mild tricuspid regurgitation, stable mitral valve repair with only trivial regurgitation.  She was advised to follow-up in September.  She contacted our office today to report lightheadedness, weakness and presyncope and was scheduled for today's appointment. She is here alone today. Reports on 08/23/21 she collapsed to the ground but did not pass out after taking her dog out to use the bathroom. She denies visual changes, just extreme weakness. Could not get herself up for one hour just felt very weak. Finally got herself to the sofa and spent the remainder of the day taking it easy.Felt weak all day, drank lots of water. Called PCP office for urgent visit. Saw Dr. Kenton Kingfisher at [facility] on 7/27 and had orthostatic vital signs, labs, tested positive for COVID. Reports no other abnormalities were identified. Took Paxlovid 7/28-09/01/21. Coughing,  congestion has improved. Today she reports that continues to feel weak, scared that she is going to fall. Felt this type of weakness when she had a fib post MV repair.  Said Dr. Kenton Kingfisher reported abnormal sounding heart rhythm.  EKG reveals frequent  SR with frequent PVCs.  She denies chest pain, shortness of breath, edema, orthopnea, PND, presyncope, syncope. Has slight abnormal feeling in her chest. She is quite talkative. It is difficult to complete the exam.   Past Medical History:  Diagnosis Date   Anemia    Asthma    CAD (coronary artery disease) 04/14/9700   Complication of anesthesia    "drop in blood pressure years ago after hysterectomy "    Depression    Dyspnea    Dysrhythmia    History of bleeding ulcers    Hypercholesteremia    Multiple thyroid nodules    Osteoarthritis of knees, bilateral 05/23/2019   Seasonal allergies     Past Surgical History:  Procedure Laterality Date   ABDOMINAL HYSTERECTOMY     complete.    BIOPSY  10/02/2018   Procedure: BIOPSY;  Surgeon: Amber Dolin, MD;  Location: [facility];  Service: Endoscopy;;  gastric   bleeding ulcer     CATARACT EXTRACTION W/PHACO Left 06/11/2019   Procedure: CATARACT EXTRACTION PHACO AND INTRAOCULAR LENS PLACEMENT (IOC) CDE: 4.63;  Surgeon: Amber Goldmann, MD;  Location: [facility] ORS;  Service: Ophthalmology;  Laterality: Left;   CATARACT EXTRACTION W/PHACO Right 06/25/2019   Procedure: CATARACT EXTRACTION  PHACO AND INTRAOCULAR LENS PLACEMENT RIGHT EYE;  Surgeon: Amber Goldmann, MD;  Location: [facility] ORS;  Service: Ophthalmology;  Laterality: Right;  CDE: 5.61   CHOLECYSTECTOMY     COLONOSCOPY N/A 12/18/2015   Rourk: diverticulosis in sigmoid colon, otherwise normal    Cyst removed from ovary  1973   dental implants     ESOPHAGOGASTRODUODENOSCOPY (EGD) WITH PROPOFOL N/A 10/02/2018   Mild Schatzki ring status post dilation, small hiatal hernia, bleeding erosive gastropathy, gastritis on bx, no h pylori.  Procedure:  ESOPHAGOGASTRODUODENOSCOPY (EGD) WITH PROPOFOL;  Surgeon: Amber Dolin, MD;  [facility]: [facility];  Service: Endoscopy;  Laterality: N/A;  12:45pm   LAPAROSCOPIC CHOLECYSTECTOMY SINGLE SITE WITH INTRAOPERATIVE CHOLANGIOGRAM N/A 05/12/2016   Procedure: LAPAROSCOPIC CHOLECYSTECTOMY SINGLE SITE WITH INTRAOPERATIVE CHOLANGIOGRAM;  Surgeon: Amber Boston, MD;  Location: [facility];  Service: General;  Laterality: N/A;   left hammer toe repair     MALONEY DILATION N/A 10/02/2018   Procedure: Venia Minks DILATION;  Surgeon: Amber Dolin, MD;  [facility];  Service: Endoscopy;  Laterality: N/A;   RIGHT/LEFT HEART CATH AND CORONARY ANGIOGRAPHY N/A 09/18/2020   Procedure: RIGHT/LEFT HEART CATH AND CORONARY ANGIOGRAPHY;  Surgeon: Martinique, Amber M, MD;  Location: [facility];  Service: Cardiovascular;  Laterality: N/A;   TEE WITHOUT CARDIOVERSION N/A 09/18/2020   Procedure: TRANSESOPHAGEAL ECHOCARDIOGRAM (TEE);  Surgeon: Amber Hector, MD;  Location: [facility] ENDOSCOPY;  Service: Cardiovascular;  Laterality: N/A;   thyroid needle biopsy     VIDEO BRONCHOSCOPY Bilateral 03/21/2017   Did not have done    Current Medications: Current Meds  Medication Sig   aspirin EC 81 MG tablet Take 1 tablet (81 mg total) by mouth daily. Swallow whole.   atorvastatin (LIPITOR) 20 MG tablet Take 20 mg by mouth daily.   cetirizine (ZYRTEC) 10 MG tablet Take 10-20 mg by mouth at bedtime.   Cholecalciferol (VITAMIN D3) 125 MCG (5000 UT) TABS Take 5,000 Units by mouth daily.   LINZESS 72 MCG capsule TAKE 1 CAPSULE BY MOUTH ONCE DAILY BEFORE BREAKFAST   pantoprazole (PROTONIX) 40 MG tablet Take 1 tablet (40 mg total) by mouth daily.   VENTOLIN HFA 108 (90 Base) MCG/ACT inhaler INHALE 2 PUFFS BY MOUTH EVERY 6 HOURS AS NEEDED FOR SHORTNESS OF BREATH   zolpidem (AMBIEN) 5 MG tablet Take 2.5 mg by mouth at bedtime.     Allergies:   Patient has no known allergies.   Social History   Socioeconomic History    Marital status: Divorced    Spouse name: Not on file   Number of children: Not on file   Years of education: Not on file   Highest education level: Not on file  Occupational History   Not on file  Tobacco Use   Smoking status: Never   Smokeless tobacco: Never  Vaping Use   Vaping Use: Never used  Substance and Sexual Activity   Alcohol use: No   Drug use: No   Sexual activity: Not Currently    Birth control/protection: None  Other Topics Concern   Not on file  Social History Narrative   Moved from [facility] 2009. In 2008 when the economy dropped lost job. Reports that after lost job was hard to get another job.  Is not married and does not have children. Used to enjoy horses. Reports that moved here b/c it was a lot cheaper and warmer. Had visited here for a horse event in 1990 so decided to move  here. Lives in [place], [place]. Reports that made stupid financial decisions and is dealing with that at this time.    Social Determinants of Health   Financial Resource Strain: Medium Risk (08/29/2020)   Overall Financial Resource Strain (CARDIA)    Difficulty of Paying Living Expenses: Somewhat hard  Food Insecurity: No Food Insecurity (08/29/2020)   Hunger Vital Sign    Worried About Running Out of Food in the Last Year: Never true    Ran Out of Food in the Last Year: Never true  Transportation Needs: No Transportation Needs (08/29/2020)   PRAPARE - Hydrologist (Medical): No    Lack of Transportation (Non-Medical): No  Physical Activity: Not on file  Stress: Not on file  Social Connections: Not on file     Family History: The patient's family history includes Breast cancer in her mother; Colon cancer in an other family member; Heart disease in her father; Tuberculosis in her mother.  ROS:   Please see the history of present illness.    + weakness  All other systems reviewed and are negative.  Labs/Other Studies Reviewed:    The following studies were  reviewed today:  Echo 04/21/21  1. Left ventricular ejection fraction, by estimation, is 50 to 55%. The  left ventricle has normal function. The left ventricle demonstrates  regional wall motion abnormalities (see scoring diagram/findings for  description). Left ventricular diastolic  parameters are indeterminate.   2. Right ventricular systolic function is normal. The right ventricular  size is normal. There is normal pulmonary artery systolic pressure. The  estimated right ventricular systolic pressure is 16.1 mmHg.   3. The mitral valve has been repaired/replaced. Trivial mitral valve  regurgitation. There is a 36 mm prosthetic annuloplasty ring present in  the mitral position.   4. The aortic valve is tricuspid. Aortic valve regurgitation is not  visualized.   5. Mild tricuspid regurgitation.   6. The inferior vena cava is normal in size with greater than 50%  respiratory variability, suggesting right atrial pressure of 3 mmHg.   Comparison(s): Prior images reviewed side by side. LVEF apprximately  50-55% at this point with interval mitral annular repair.   [facility] 09/18/20    Prox LAD to Mid LAD lesion is 25% stenosed.   The left ventricular systolic function is normal.   LV end diastolic pressure is normal.   The left ventricular ejection fraction is 55-65% by visual estimate.   There is severe (4+) mitral regurgitation.   There is severe (4+) mitral regurgitation and severe mitral valve prolapse.   Minimal nonobstructive CAD Severe posterior mitral valve prolapse with severe MR Normal right heart and LV filling pressures   Plan: consider referral for MV repair.    TEE 09/18/20   1. Left ventricular ejection fraction, by estimation, is 65 to 70%. The  left ventricle has normal function.   2. Right ventricular systolic function is normal. The right ventricular  size is normal.   3. Left atrial size was mildly dilated. No left atrial/left atrial  appendage thrombus was  detected.   4. Extensive 3 D imaging True Barlow's valve with billowing and prolpase  of both anterior and posterior leaflets. Worst lesions are partial flail  P2 and severe prolapse of P3 resulting in severe ecdentric anteriorly  directed MR. Only partial reversal  seen in right superior PV. . The mitral valve is myxomatous. Severe mitral  valve regurgitation. Severe mitral stenosis.   5. The  aortic valve is tricuspid. Aortic valve regurgitation is not  visualized.   6. Aortic dilatation noted. There is moderate dilatation of the ascending  aorta.   Recent Labs: 01/23/2021: ALT 23; BUN 15; Creatinine, Ser 0.90; Potassium 4.2; Sodium 137; TSH 2.335 03/27/2021: Hemoglobin 14.2; Platelets 256  Recent Lipid Panel    Component Value Date/Time   CHOL 156 03/27/2021 1138   TRIG 108 03/27/2021 1138   HDL 62 03/27/2021 1138   CHOLHDL 2.5 03/27/2021 1138   VLDL 22 03/27/2021 1138   LDLCALC 72 03/27/2021 1138   LDLCALC 76 06/13/2017 1123     Risk Assessment/Calculations:      Physical Exam:    VS:  BP 118/78   Pulse 91   Ht 5' 3.5" (1.613 Bird)   Wt 141 lb 12.8 oz (64.3 kg)   SpO2 96%   BMI 24.72 kg/Bird     Wt Readings from Last 3 Encounters:  09/10/21 141 lb 12.8 oz (64.3 kg)  06/24/21 143 lb 3.2 oz (65 kg)  03/27/21 140 lb (63.5 kg)     GEN:  Well nourished, well developed in no acute distress HEENT: Normal NECK: No JVD; No carotid bruits CARDIAC: RRR, no murmurs, rubs, gallops RESPIRATORY:  Clear to auscultation without rales, wheezing or rhonchi  ABDOMEN: Soft, non-tender, non-distended MUSCULOSKELETAL:  No edema; No deformity. 2+ pedal pulses, equal bilaterally SKIN: Warm and dry NEUROLOGIC:  Alert and oriented x 3 PSYCHIATRIC:  Normal affect   EKG:  EKG is ordered today.  The ekg ordered today demonstrates NSR at 91 bpm with frequent PVCs  Diagnoses:    1. H/O mitral valve repair   2. PVC (premature ventricular contraction)   3. Weakness   4. Severe mitral  insufficiency   5. Coronary artery disease involving native coronary artery of native heart without angina pectoris   6. Palpitations    Assessment and Plan:     Weakness: Severe weakness on 7/23, diagnosed with COVID 3 days later.  Feels that chest congestion has improved, however she continues to feel weak.  Wonders if BP has been low.  BP is well-controlled today. Orthostatic vital signs are negative today.  Symptoms could be secondary to lingering COVID infection. TSH, hemoglobin, kidney function and electrolytes stable on 7/26. No evidence of bleeding per patient.  No indication to repeat Bird work today.  We will get echocardiogram to evaluate heart and valve function, and ZIO monitor for evaluation of arrhythmia. BP cuff given to patient for home monitoring.   Frequent PVCs/Palpitations: She reports slight palpitations not as severe as prior to MV repair or postop atrial fibrillation. Just aware that something feels slightly abnormal. Took amiodarone and Eliquis for a fib which were subsequently stopped in January 2023. No evidence of recurrence. SR with frequent PVCs on EKG.  We will place a 14-day ZIO monitor for quantification of PVCs and to evaluate for additional abnormalities.   Mitral regurgitation s/p MV repair: Mitral valve repair at [facility] 11/2020.  Has done well since that time. Valve function stable by echo 04/2021.  Postop A-fib for which she took amiodarone and Eliquis for approximately 2 months with no recurrence to our awareness. I do not appreciate a significant murmur on exam today. Describes slight palpitations, not like prior to valve repair. She denies chest pain, dyspnea. I do not suspect change in valve function however due increased PVCs and history of MV repair, we will repeat echocardiogram.   Mild CAD: 25% stenosis proximal to mid LAD  by cath 09/2020. She denies chest pain, dyspnea, or other symptoms concerning for angina.  No indication for further ischemic evaluation at  this time.  Continue atorvastatin, aspirin.  Blood pressure: She denies a history of hypertension.  BP is well-controlled today.  She asks about low parameters that may cause passing out. Does not have home BP cuff, we will provide one for her today.     Disposition: 3 months with Dr. Harrington Challenger - will arrange sooner appointment if abnormal test results  Medication Adjustments/Labs and Tests Ordered: Current medicines are reviewed at length with the patient today.  Concerns regarding medicines are outlined above.  Orders Placed This Encounter  Procedures   LONG TERM MONITOR (3-14 DAYS)   EKG 12-Lead   ECHOCARDIOGRAM COMPLETE   No orders of the defined types were placed in this encounter.   Patient Instructions  Medication Instructions:  Your physician recommends that you continue on your current medications as directed. Please refer to the Current Medication list given to you today.  *If you need a refill on your cardiac medications before your next appointment, please call your pharmacy*   Bird Work: None If you have labs (blood work) drawn today and your tests are completely normal, you will receive your results only by: [place] (if you have MyChart) OR A paper copy in the mail If you have any Bird test that is abnormal or we need to change your treatment, we will call you to review the results.   Testing/Procedures: Your physician has requested that you have an echocardiogram. Echocardiography is a painless test that uses sound waves to create images of your heart. It provides your doctor with information about the size and shape of your heart and how well your heart's chambers and valves are working. This procedure takes approximately one hour. There are no restrictions for this procedure.  ZIO XT- Long Term Monitor Instructions  Your physician has requested you wear a ZIO patch monitor for 14 days.  This is a single patch monitor. [employer] supplies one patch monitor per  enrollment. Additional stickers are not available. Please do not apply patch if you will be having a Nuclear Stress Test,  Echocardiogram, Cardiac CT, MRI, or Chest Xray during the period you would be wearing the  monitor. The patch cannot be worn during these tests. You cannot remove and re-apply the  ZIO XT patch monitor.  Your ZIO patch monitor will be mailed 3 day USPS to your address on file. It may take 3-5 days  to receive your monitor after you have been enrolled.  Once you have received your monitor, please review the enclosed instructions. Your monitor  has already been registered assigning a specific monitor serial # to you.  Billing and Patient Assistance Program Information  We have supplied [employer] with any of your insurance information on file for billing purposes. [employer] offers a sliding scale Patient Assistance Program for patients that do not have  insurance, or whose insurance does not completely cover the cost of the ZIO monitor.  You must apply for the Patient Assistance Program to qualify for this discounted rate.  To apply, please call [employer] at [phone], select option 4, select option 2, ask to apply for  Patient Assistance Program. Theodore Demark will ask your household income, and how many people  are in your household. They will quote your out-of-pocket cost based on that information.  [employer] will also be able to set up a 26-month interest-free payment plan if needed.  Applying the monitor   Shave hair from upper left chest.  Hold abrader disc by orange tab. Rub abrader in 40 strokes over the upper left chest as  indicated in your monitor instructions.  Clean area with 4 enclosed alcohol pads. Let dry.  Apply patch as indicated in monitor instructions. Patch will be placed under collarbone on left  side of chest with arrow pointing upward.  Rub patch adhesive wings for 2 minutes. Remove white label marked "1". Remove the white  label marked "2". Rub patch  adhesive wings for 2 additional minutes.  While looking in a mirror, press and release button in center of patch. A small green light will  flash 3-4 times. This will be your only indicator that the monitor has been turned on.  Do not shower for the first 24 hours. You may shower after the first 24 hours.  Press the button if you feel a symptom. You will hear a small click. Record Date, Time and  Symptom in the Patient Logbook.  When you are ready to remove the patch, follow instructions on the last 2 pages of Patient  Logbook. Stick patch monitor onto the last page of Patient Logbook.  Place Patient Logbook in the blue and white box. Use locking tab on box and tape box closed  securely. The blue and white box has prepaid postage on it. Please place it in the mailbox as  soon as possible. Your physician should have your test results approximately 7 days after the  monitor has been mailed back to [facility].  Call [place] at [phone] if you have questions regarding  your ZIO XT patch monitor. Call them immediately if you see an orange light blinking on your  monitor.  If your monitor falls off in less than 4 days, contact our Monitor department at [phone].  If your monitor becomes loose or falls off after 4 days call [employer] at [phone] for  suggestions on securing your monitor     Follow-Up: At [facility], you and your health needs are our priority.  As part of our continuing mission to provide you with exceptional heart care, we have created designated Provider Care Teams.  These Care Teams include your primary Cardiologist (physician) and Advanced Practice Providers (APPs -  Physician Assistants and Nurse Practitioners) who all work together to provide you with the care you need, when you need it.  We recommend signing up for the patient portal called "MyChart".  Sign up information is provided on this After Visit Summary.  MyChart is used to  connect with patients for Virtual Visits (Telemedicine).  Patients are able to view Bird/test results, encounter notes, upcoming appointments, etc.  Non-urgent messages can be sent to your provider as well.   To learn more about what you can do with MyChart, go to [email or web address].    Your next appointment:   12/18/2021 at 2:00 PM, you have been placed on the waiting list for a sooner appt if possible  The format for your next appointment:   In Person  Provider:   [name], MD{  Other Instructions HOW TO TAKE YOUR BLOOD PRESSURE: Rest 5 minutes before taking your blood pressure.  Don't smoke or drink caffeinated beverages for at least 30 minutes before. Take your blood pressure before (not after) you eat. Sit comfortably with your back supported and both feet on the floor (don't cross your legs). Elevate your arm to heart level on a table or a desk.  Use the proper sized cuff. It should fit smoothly and snugly around your bare upper arm. There should be enough room to slip a fingertip under the cuff. The bottom edge of the cuff should be 1 inch above the crease of the elbow.   Important Information About Sugar         Signed, Emmaline Life, NP  09/10/2021 5:40 PM    [facility]

## 2021-09-10 NOTE — Patient Instructions (Signed)
Medication Instructions:  Your physician recommends that you continue on your current medications as directed. Please refer to the Current Medication list given to you today.  *If you need a refill on your cardiac medications before your next appointment, please call your pharmacy*   Lab Work: None If you have labs (blood work) drawn today and your tests are completely normal, you will receive your results only by: [place] (if you have MyChart) OR A paper copy in the mail If you have any lab test that is abnormal or we need to change your treatment, we will call you to review the results.   Testing/Procedures: Your physician has requested that you have an echocardiogram. Echocardiography is a painless test that uses sound waves to create images of your heart. It provides your doctor with information about the size and shape of your heart and how well your heart's chambers and valves are working. This procedure takes approximately one hour. There are no restrictions for this procedure.  ZIO XT- Long Term Monitor Instructions  Your physician has requested you wear a ZIO patch monitor for 14 days.  This is a single patch monitor. [employer] supplies one patch monitor per enrollment. Additional stickers are not available. Please do not apply patch if you will be having a Nuclear Stress Test,  Echocardiogram, Cardiac CT, MRI, or Chest Xray during the period you would be wearing the  monitor. The patch cannot be worn during these tests. You cannot remove and re-apply the  ZIO XT patch monitor.  Your ZIO patch monitor will be mailed 3 day USPS to your address on file. It may take 3-5 days  to receive your monitor after you have been enrolled.  Once you have received your monitor, please review the enclosed instructions. Your monitor  has already been registered assigning a specific monitor serial # to you.  Billing and Patient Assistance Program Information  We have supplied [employer] with  any of your insurance information on file for billing purposes. [employer] offers a sliding scale Patient Assistance Program for patients that do not have  insurance, or whose insurance does not completely cover the cost of the ZIO monitor.  You must apply for the Patient Assistance Program to qualify for this discounted rate.  To apply, please call [employer] at [phone], select option 4, select option 2, ask to apply for  Patient Assistance Program. Theodore Demark will ask your household income, and how many people  are in your household. They will quote your out-of-pocket cost based on that information.  [employer] will also be able to set up a 89-month interest-free payment plan if needed.  Applying the monitor   Shave hair from upper left chest.  Hold abrader disc by orange tab. Rub abrader in 40 strokes over the upper left chest as  indicated in your monitor instructions.  Clean area with 4 enclosed alcohol pads. Let dry.  Apply patch as indicated in monitor instructions. Patch will be placed under collarbone on left  side of chest with arrow pointing upward.  Rub patch adhesive wings for 2 minutes. Remove white label marked "1". Remove the white  label marked "2". Rub patch adhesive wings for 2 additional minutes.  While looking in a mirror, press and release button in center of patch. A small green light will  flash 3-4 times. This will be your only indicator that the monitor has been turned on.  Do not shower for the first 24 hours. You may shower after the first  24 hours.  Press the button if you feel a symptom. You will hear a small click. Record Date, Time and  Symptom in the Patient Logbook.  When you are ready to remove the patch, follow instructions on the last 2 pages of Patient  Logbook. Stick patch monitor onto the last page of Patient Logbook.  Place Patient Logbook in the blue and white box. Use locking tab on box and tape box closed  securely. The blue and white box has prepaid  postage on it. Please place it in the mailbox as  soon as possible. Your physician should have your test results approximately 7 days after the  monitor has been mailed back to [facility].  Call [place] at [phone] if you have questions regarding  your ZIO XT patch monitor. Call them immediately if you see an orange light blinking on your  monitor.  If your monitor falls off in less than 4 days, contact our Monitor department at [phone].  If your monitor becomes loose or falls off after 4 days call [employer] at [phone] for  suggestions on securing your monitor     Follow-Up: At [facility], you and your health needs are our priority.  As part of our continuing mission to provide you with exceptional heart care, we have created designated Provider Care Teams.  These Care Teams include your primary Cardiologist (physician) and Advanced Practice Providers (APPs -  Physician Assistants and Nurse Practitioners) who all work together to provide you with the care you need, when you need it.  We recommend signing up for the patient portal called "MyChart".  Sign up information is provided on this After Visit Summary.  MyChart is used to connect with patients for Virtual Visits (Telemedicine).  Patients are able to view lab/test results, encounter notes, upcoming appointments, etc.  Non-urgent messages can be sent to your provider as well.   To learn more about what you can do with MyChart, go to [email or web address].    Your next appointment:   12/18/2021 at 2:00 PM, you have been placed on the waiting list for a sooner appt if possible  The format for your next appointment:   In Person  Provider:   [name], MD{  Other Instructions HOW TO TAKE YOUR BLOOD PRESSURE: Rest 5 minutes before taking your blood pressure.  Don't smoke or drink caffeinated beverages for at least 30 minutes before. Take your blood pressure before (not after) you eat. Sit  comfortably with your back supported and both feet on the floor (don't cross your legs). Elevate your arm to heart level on a table or a desk. Use the proper sized cuff. It should fit smoothly and snugly around your bare upper arm. There should be enough room to slip a fingertip under the cuff. The bottom edge of the cuff should be 1 inch above the crease of the elbow.   Important Information About [place]

## 2021-09-18 ENCOUNTER — Ambulatory Visit (HOSPITAL_COMMUNITY)
Admission: RE | Admit: 2021-09-18 | Discharge: 2021-09-18 | Disposition: A | Discharge status: Home / Self Care | Payer: Medicare HMO | Source: Ambulatory Visit | Attending: Nurse Practitioner | Admitting: Nurse Practitioner

## 2021-09-18 ENCOUNTER — Telehealth: Payer: Self-pay | Admitting: Internal Medicine

## 2021-09-18 DIAGNOSIS — I2510 Atherosclerotic heart disease of native coronary artery without angina pectoris: Secondary | ICD-10-CM | POA: Diagnosis not present

## 2021-09-18 DIAGNOSIS — I340 Nonrheumatic mitral (valve) insufficiency: Secondary | ICD-10-CM | POA: Diagnosis not present

## 2021-09-18 DIAGNOSIS — I4891 Unspecified atrial fibrillation: Secondary | ICD-10-CM | POA: Insufficient documentation

## 2021-09-18 DIAGNOSIS — Z9889 Other specified postprocedural states: Secondary | ICD-10-CM | POA: Insufficient documentation

## 2021-09-18 DIAGNOSIS — E785 Hyperlipidemia, unspecified: Secondary | ICD-10-CM | POA: Diagnosis not present

## 2021-09-18 DIAGNOSIS — Z954 Presence of other heart-valve replacement: Secondary | ICD-10-CM

## 2021-09-18 LAB — ECHOCARDIOGRAM COMPLETE
Area-P 1/2: 2.62 cm2
MV M vel: 5.19 m/s
MV Peak grad: 107.7 mmHg
Radius: 0.6 cm
S' Lateral: 2.87 cm

## 2021-09-18 NOTE — Progress Notes (Signed)
*  PRELIMINARY RESULTS* Echocardiogram 2D Echocardiogram has been performed.  Amber Bird 09/18/2021, 10:24 AM

## 2021-09-18 NOTE — Telephone Encounter (Signed)
Patient is returning call to discuss echo results. °

## 2021-09-18 NOTE — Telephone Encounter (Signed)
Called pt reviewed Echocardiogram results and provider comments.  Pt reports feels fatigued advised that more than likely effects of COVID from July. Echocardiogram was stable.

## 2021-09-19 DIAGNOSIS — I493 Ventricular premature depolarization: Secondary | ICD-10-CM

## 2021-09-21 ENCOUNTER — Telehealth: Payer: Self-pay | Admitting: Internal Medicine

## 2021-09-21 NOTE — Telephone Encounter (Signed)
I spoke with patient and she has not any episodes since. She will continue to monitor.

## 2021-09-21 NOTE — Telephone Encounter (Signed)
Pt c/o of Chest Pain: STAT if CP now or developed within 24 hours  1. Are you having CP right now? No  2. Are you experiencing any other symptoms (ex. SOB, nausea, vomiting, sweating)? No  3. How long have you been experiencing CP?  Only Saturday night  4. Is your CP continuous or coming and going?  Pain was continuous for that episode  5. Have you taken Nitroglycerin? No  Patient called stating on Saturday night at 7:26 pm she was sitting watching TV and she had a sharp, sudden, scary pain that went from below her breast in the heart area up to her chin.  Patient stated it felt like someone was trying to strangle her.  Patient stated she took an aspirin and made a note in the [place].

## 2021-10-07 DIAGNOSIS — Z1231 Encounter for screening mammogram for malignant neoplasm of breast: Secondary | ICD-10-CM | POA: Diagnosis not present

## 2021-10-12 DIAGNOSIS — I493 Ventricular premature depolarization: Secondary | ICD-10-CM | POA: Diagnosis not present

## 2021-10-14 ENCOUNTER — Telehealth: Payer: Self-pay | Admitting: Internal Medicine

## 2021-10-14 NOTE — Telephone Encounter (Signed)
Patient is calling requesting her heart monitor results.

## 2021-10-14 NOTE — Telephone Encounter (Signed)
Pt aware will call once monitor has been reviewed ./cy

## 2021-10-15 MED ORDER — METOPROLOL SUCCINATE ER 25 MG PO TB24: 25.0000 mg | ORAL_TABLET | Freq: Every day | ORAL | 3 refills | Status: DC

## 2021-10-15 NOTE — Telephone Encounter (Signed)
Pt advised her monitor results and will try the Toprol and keep her 12/18/21 appt with Dr Harrington Challenger.

## 2021-10-15 NOTE — Addendum Note (Signed)
Addended by: Stephani Police on: 10/15/2021 11:16 AM   Modules accepted: Orders

## 2021-10-15 NOTE — Telephone Encounter (Signed)
Pt is returning call and requesting call back.  

## 2021-10-15 NOTE — Telephone Encounter (Signed)
Reviewed monitor results and recomm made by MSwinyer    I agree with recommendations   (Not sure why monitor not put in in basket to read)

## 2021-10-15 NOTE — Telephone Encounter (Signed)
Left a message for the pt to call back.  

## 2021-10-16 NOTE — Telephone Encounter (Signed)
Patient was calling back with questions/concerns. Please advise

## 2021-10-16 NOTE — Telephone Encounter (Signed)
Patient states that she was looking at side effects of metoprolol and saw that dizziness was one. She states that she already has problems with dizziness so she is concerned. She would like to know if she can take her metoprolol at night so that if she does get dizzy she will be asleep. Advised patient that she should take it at night.

## 2021-10-19 ENCOUNTER — Other Ambulatory Visit: Payer: Self-pay | Admitting: Gastroenterology

## 2021-10-22 ENCOUNTER — Telehealth: Payer: Self-pay | Admitting: Internal Medicine

## 2021-10-22 ENCOUNTER — Ambulatory Visit: Payer: Medicare HMO | Admitting: Internal Medicine

## 2021-10-22 NOTE — Telephone Encounter (Signed)
Patient states she was returning call. Please advise  

## 2021-10-22 NOTE — Telephone Encounter (Signed)
Pt has already been notified X 2 of monitor results.

## 2021-10-23 ENCOUNTER — Other Ambulatory Visit: Payer: Self-pay | Admitting: Gastroenterology

## 2021-12-09 DIAGNOSIS — Z0000 Encounter for general adult medical examination without abnormal findings: Secondary | ICD-10-CM | POA: Diagnosis not present

## 2021-12-09 DIAGNOSIS — H6692 Otitis media, unspecified, left ear: Secondary | ICD-10-CM | POA: Diagnosis not present

## 2021-12-09 DIAGNOSIS — Z23 Encounter for immunization: Secondary | ICD-10-CM | POA: Diagnosis not present

## 2021-12-09 DIAGNOSIS — D869 Sarcoidosis, unspecified: Secondary | ICD-10-CM | POA: Diagnosis not present

## 2021-12-09 DIAGNOSIS — Z79899 Other long term (current) drug therapy: Secondary | ICD-10-CM | POA: Diagnosis not present

## 2021-12-09 DIAGNOSIS — N644 Mastodynia: Secondary | ICD-10-CM | POA: Diagnosis not present

## 2021-12-09 DIAGNOSIS — J3489 Other specified disorders of nose and nasal sinuses: Secondary | ICD-10-CM | POA: Diagnosis not present

## 2021-12-09 DIAGNOSIS — I700 Atherosclerosis of aorta: Secondary | ICD-10-CM | POA: Diagnosis not present

## 2021-12-09 DIAGNOSIS — G4700 Insomnia, unspecified: Secondary | ICD-10-CM | POA: Diagnosis not present

## 2021-12-09 DIAGNOSIS — M810 Age-related osteoporosis without current pathological fracture: Secondary | ICD-10-CM | POA: Diagnosis not present

## 2021-12-09 DIAGNOSIS — D860 Sarcoidosis of lung: Secondary | ICD-10-CM | POA: Diagnosis not present

## 2021-12-09 DIAGNOSIS — E782 Mixed hyperlipidemia: Secondary | ICD-10-CM | POA: Diagnosis not present

## 2021-12-14 ENCOUNTER — Inpatient Hospital Stay
Admission: RE | Admit: 2021-12-14 | Discharge: 2021-12-14 | Disposition: A | Discharge status: Home / Self Care | Payer: Self-pay | Source: Ambulatory Visit | Attending: Family Medicine | Admitting: Family Medicine

## 2021-12-14 ENCOUNTER — Other Ambulatory Visit (HOSPITAL_COMMUNITY): Payer: Self-pay | Admitting: Family Medicine

## 2021-12-14 DIAGNOSIS — N644 Mastodynia: Secondary | ICD-10-CM

## 2021-12-14 DIAGNOSIS — M810 Age-related osteoporosis without current pathological fracture: Secondary | ICD-10-CM

## 2021-12-17 NOTE — Progress Notes (Signed)
Cardiology Office Note   Date:  12/18/2021   ID:  Amber Bird, DOB 1946-01-08, MRN [id number]  PCP:  [name], MD  Cardiologist:   [name], MD   Patient presents for f/u from MV repair      History of Present Illness: Amber Bird is a 76 y.o. female with a history of  MVP and mitral regurgitaiton.  Pt also with hx remote chest pain  I saw the pt in JUne 2022 for the first time    She complained of some dyspnea on exertion   Echo showed MVP with significant mitral regurgitation   (see below)    LVEF normal    TEE done to further evaluate The pt went on to have R/L heart cath and then was sent to [facility] (Dr [name]) for repair on 12/01/20 (36 mm Ring)  She was discharged on 12/05/20   The pt says for 3 days she felt great  Then  On 11/8 got SOB  She was readmitted for afib wit hRVR   Tx to [facility] to SR and started on Eliquis and Amio I saw her in Nov   Doing good  Temp a little low at home Recomm stop amio and then Eliquis in the following months  Pt returns today for f/u  She says that on Jan 9 and 10 she had a couple episodes of chst pain , present when she woke up Low sternal   Like someone pressng on chest   Not severe    Didn't go away  When sitting it was  better     On 15 and 17 had same thing   This time occurred during day while sitting     Since Jan 17 she has had no further spells     The pt says her breathing is [employer] dog a few times per day   [place]    Doing better      Still fatigued    Says her temp is low at times     Breakfast:   Oatmeal with prunes and chia seeds  1/2 cup skim mik   Coffee Lunch    Chicken salad/bread/romaine Dinner   Cod or chicken and brocolli/potatoes Water  2 small squares of chocolate   2 cookies  Oreos     Lipids in Dec LDL 129      On 7 /23/23 had syncopal spell  WOke up   First thing does is goes to let dog out    Hot outside   Felt fine   Got back in house   Locked door   Woke up on the floor    Head wet   had vomited   When opened eyes dog close by     Got up   Not stable    Couch a foot away  Collapsed on floor a couple hours   Couple hours passed   Got up  Took meds     Later (7/27 / 23) tested positive for covid     SHe was seen by Elwyn Reach  on 09/10/21  Had palpitaitons     Wore monitor    See below   Had ectopy  No afib   Started on Toprol XL   Takes at night  ALso had echo  LVEF normla    Pt is scared about upcoming mammogram   No palpitaiotns    Breathing is OK  Current Meds  Medication Sig   aspirin EC 81 MG tablet Take 1 tablet (81 mg total) by mouth daily. Swallow whole.   atorvastatin (LIPITOR) 20 MG tablet Take 20 mg by mouth daily.   cetirizine (ZYRTEC) 10 MG tablet Take 10-20 mg by mouth at bedtime.   Cholecalciferol (VITAMIN D3) 125 MCG (5000 UT) TABS Take 5,000 Units by mouth daily.   LINZESS 72 MCG capsule TAKE 1 CAPSULE BY MOUTH ONCE DAILY BEFORE BREAKFAST   metoprolol succinate (TOPROL XL) 25 MG 24 hr tablet Take 1 tablet (25 mg total) by mouth daily.   pantoprazole (PROTONIX) 40 MG tablet Take 1 tablet (40 mg total) by mouth daily.   zolpidem (AMBIEN) 5 MG tablet Take 2.5 mg by mouth at bedtime.     Allergies:   Patient has no known allergies.   Past Medical History:  Diagnosis Date   Anemia    Asthma    CAD (coronary artery disease) 1/44/8185   Complication of anesthesia    "drop in blood pressure years ago after hysterectomy "    Depression    Dyspnea    Dysrhythmia    History of bleeding ulcers    Hypercholesteremia    Multiple thyroid nodules    Osteoarthritis of knees, bilateral 05/23/2019   Seasonal allergies     Past Surgical History:  Procedure Laterality Date   ABDOMINAL HYSTERECTOMY     complete.    BIOPSY  10/02/2018   Procedure: BIOPSY;  Surgeon: Daneil Dolin, MD;  Location: [facility];  Service: Endoscopy;;  gastric   bleeding ulcer     CATARACT EXTRACTION W/PHACO Left 06/11/2019   Procedure: CATARACT EXTRACTION PHACO AND  INTRAOCULAR LENS PLACEMENT (IOC) CDE: 4.63;  Surgeon: Baruch Goldmann, MD;  Location: [facility] ORS;  Service: Ophthalmology;  Laterality: Left;   CATARACT EXTRACTION W/PHACO Right 06/25/2019   Procedure: CATARACT EXTRACTION PHACO AND INTRAOCULAR LENS PLACEMENT RIGHT EYE;  Surgeon: Baruch Goldmann, MD;  Location: [facility] ORS;  Service: Ophthalmology;  Laterality: Right;  CDE: 5.61   CHOLECYSTECTOMY     COLONOSCOPY N/A 12/18/2015   Rourk: diverticulosis in sigmoid colon, otherwise normal    Cyst removed from ovary  1973   dental implants     ESOPHAGOGASTRODUODENOSCOPY (EGD) WITH PROPOFOL N/A 10/02/2018   Mild Schatzki ring status post dilation, small hiatal hernia, bleeding erosive gastropathy, gastritis on bx, no h pylori.  Procedure: ESOPHAGOGASTRODUODENOSCOPY (EGD) WITH PROPOFOL;  Surgeon: Daneil Dolin, MD;  Location: [facility];  Service: Endoscopy;  Laterality: N/A;  12:45pm   LAPAROSCOPIC CHOLECYSTECTOMY SINGLE SITE WITH INTRAOPERATIVE CHOLANGIOGRAM N/A 05/12/2016   Procedure: LAPAROSCOPIC CHOLECYSTECTOMY SINGLE SITE WITH INTRAOPERATIVE CHOLANGIOGRAM;  Surgeon: Michael Boston, MD;  Location: [facility];  Service: General;  Laterality: N/A;   left hammer toe repair     MALONEY DILATION N/A 10/02/2018   Procedure: Venia Minks DILATION;  Surgeon: Daneil Dolin, MD;  Location: [facility];  Service: Endoscopy;  Laterality: N/A;   RIGHT/LEFT HEART CATH AND CORONARY ANGIOGRAPHY N/A 09/18/2020   Procedure: RIGHT/LEFT HEART CATH AND CORONARY ANGIOGRAPHY;  Surgeon: Martinique, Peter M, MD;  Location: [facility];  Service: Cardiovascular;  Laterality: N/A;   TEE WITHOUT CARDIOVERSION N/A 09/18/2020   Procedure: TRANSESOPHAGEAL ECHOCARDIOGRAM (TEE);  Surgeon: Josue Hector, MD;  Location: [facility] ENDOSCOPY;  Service: Cardiovascular;  Laterality: N/A;   thyroid needle biopsy     VIDEO BRONCHOSCOPY Bilateral 03/21/2017   Did not have done     Social History:  The patient  reports that she has never smoked. She  has never used smokeless tobacco. She reports that she does not drink alcohol and does not use drugs.   Family History:  The patient's family history includes Breast cancer in her mother; Colon cancer in an other family member; Heart disease in her father; Tuberculosis in her mother.    ROS:  Please see the history of present illness. All other systems are reviewed and  Negative to the above problem except as noted.    PHYSICAL EXAM: VS:  BP 98/67 (BP Location: Left Arm, Patient Position: Sitting, Cuff Size: Normal)   Pulse 80   Ht 5' 3.5" (1.613 m)   Wt 149 lb 3.2 oz (67.7 kg)   SpO2 98%   BMI 26.02 kg/m   GEN: Well nourished, well developed, in no acute distress  HEENT: normal  Neck: no JVD, carotid bruits Cardiac: RRR;  No murmurs   No LE  edema  Respiratory:  clear to auscultation bilaterally,  GI: soft, nontender, nondistended, + BS  No hepatomegaly  MS: no deformity Moving all extremities   Skin: warm and dry, no rash Neuro:  Strength and sensation are intact Psych: euthymic mood, full affect   EKG:  EKG is Not ordered today.  Zio patch   Sept 2023  Patient had a min HR of 60 bpm, max HR of 193 bpm, and avg HR of 81 bpm. Predominant underlying rhythm was Sinus Rhythm. 4 Ventricular Tachycardia runs occurred, the run with the fastest interval lasting 4 beats with a max rate of 171 bpm, the longest lasting 7 beats with an avg rate of 125 bpm. 52 Supraventricular Tachycardia runs occurred, the run with the fastest interval lasting 5 beats with a max rate of 193 bpm, the longest lasting 14 beats with an avg rate of 97 bpm. Some episodes of Supraventricular Tachycardia may be possible Atrial Tachycardia with variable block. Isolated SVEs were rare (<1.0%), SVE Couplets were rare (<1.0%), and SVE Triplets were rare (<1.0%). Isolated VEs were rare (<1.0%, 11910), VE Couplets were rare (<1.0%, 199), and VE Triplets were rare (<1.0%, 8). Ventricular Bigeminy and Trigeminy were  present.  Echo   AUg 2023  Right ventricular systolic function is normal. The right ventricular size is normal. There is normal pulmonary artery systolic pressure. The estimated right ventricular systolic pressure is 72.0 mmHg. 2. The mitral valve has been repaired/replaced. Mild mitral valve regurgitation. No evidence of mitral stenosis. 3. The aortic valve is tricuspid. Aortic valve regurgitation is not visualized. Aortic valve sclerosis is present, with no evidence of aortic valve stenosis. 4. 5. There is mild dilatation of the aortic root, measuring 38 mm. The inferior vena cava is normal in size with greater than 50% respiratory variability, suggesting right atrial pressure of 3 mmHg.  Cath   09/18/20    Prox LAD to Mid LAD lesion is 25% stenosed.   The left ventricular systolic function is normal.   LV end diastolic pressure is normal.   The left ventricular ejection fraction is 55-65% by visual estimate.   There is severe (4+) mitral regurgitation.   There is severe (4+) mitral regurgitation and severe mitral valve prolapse.   Minimal nonobstructive CAD Severe posterior mitral valve prolapse with severe MR Normal right heart and LV filling pressures   Plan: consider referral for MV repair.  TEE   09/18/20  eft ventricular ejection fraction, by estimation, is 65 to 70%. The left ventricle has normal function. 1.  2. Right ventricular systolic function is normal. The right ventricular size is normal. Left atrial size was mildly dilated. No left atrial/left atrial appendage thrombus was detected. 3. Extensive 3 D imaging True Barlow's valve with billowing and prolpase of both anterior and posterior leaflets. Worst lesions are partial flail P2 and severe prolapse of P3 resulting in severe ecdentric anteriorly directed MR. Only partial reversal seen in right superior PV. . The mitral valve is myxomatous. Severe mitral valve regurgitation. Severe mitral stenosis. 4. 5.  The aortic valve is tricuspid. Aortic valve regurgitation is not visualized. 6. Aortic dilatation noted. There is moderate dilatation of the ascending aorta.  Echo   07/22/20  1. Left ventricular ejection fraction, by estimation, is 60 to 65%. The left ventricle has normal function. The left ventricle demonstrates regional wall motion abnormalities (see scoring diagram/findings for description). There is mild left ventricular hypertrophy. Left ventricular diastolic parameters were normal. Mild anteoseptal wall hypokinesis. 2. Right ventricular systolic function is normal. The right ventricular size is normal. There is normal pulmonary artery systolic pressure. 3. Left atrial size was severely dilated. 4. Right atrial size was moderately dilated. 5. Posterior leaflet prolapse resulting in eccentric anterior mitral regurgitation. The eccentricity makes it difficult to asses severity. MV to AV VTI ratio of 1.4 would suggest moderate to severe MR. Consider TEE. . The mitral valve is abnormal. Moderate to severe mitral valve regurgitation. 6. The aortic valve is tricuspid. Aortic valve regurgitation is not visualized. No aortic stenosis is present. 7. The inferior vena cava is normal in size with greater than 50% respiratory variability, suggesting right atrial pressure of 3 mmHg. Lipid Panel    Component Value Date/Time   CHOL 156 03/27/2021 1138   TRIG 108 03/27/2021 1138   HDL 62 03/27/2021 1138   CHOLHDL 2.5 03/27/2021 1138   VLDL 22 03/27/2021 1138   LDLCALC 72 03/27/2021 1138   LDLCALC 76 06/13/2017 1123      Wt Readings from Last 3 Encounters:  12/18/21 149 lb 3.2 oz (67.7 kg)  09/10/21 141 lb 12.8 oz (64.3 kg)  06/24/21 143 lb 3.2 oz (65 kg)      ASSESSMENT AND PLAN:  1  Mitral regurgitation  Pt is s/p repair at [facility]  Excellent   No murmur    She did have an echo [facility] after surgery   Last echo 2023:  Mild MR   Keep  on ASA    2  PAF   No recurrence of afib  clinincally   Off of amiodarone and Eliquis    3  Syncope  Montior neg   Echo OK   Most likely situational  (early am, got out of bed, no fluids prior, coming down with COVID)    4  Palpitatoins  Monitor as noted above   No signif arrhythmias    On ToprolXL   Denies palpitations now     5   Lipids   Keep on lipitor    Plan for f/u 6 months     Current medicines are reviewed at length with the patient today.  The patient does not have concerns regarding medicines.  Signed, [name], MD  12/18/2021 2:39 PM    [address], [place], [place]  [address] Phone: [phone]; Fax: [phone]

## 2021-12-18 ENCOUNTER — Encounter: Payer: Self-pay | Admitting: Internal Medicine

## 2021-12-18 ENCOUNTER — Ambulatory Visit (INDEPENDENT_AMBULATORY_CARE_PROVIDER_SITE_OTHER): Payer: Medicare HMO | Admitting: Internal Medicine

## 2021-12-18 VITALS — BP 98/67 | HR 80 | Ht 63.5 in | Wt 149.2 lb

## 2021-12-18 DIAGNOSIS — I059 Rheumatic mitral valve disease, unspecified: Secondary | ICD-10-CM

## 2021-12-18 NOTE — Patient Instructions (Signed)
Medication Instructions:  Your physician recommends that you continue on your current medications as directed. Please refer to the Current Medication list given to you today.  *If you need a refill on your cardiac medications before your next appointment, please call your pharmacy*   Lab Work: NONE   If you have labs (blood work) drawn today and your tests are completely normal, you will receive your results only by: [place] (if you have MyChart) OR A paper copy in the mail If you have any lab test that is abnormal or we need to change your treatment, we will call you to review the results.   Testing/Procedures: NONE    Follow-Up: At [facility], you and your health needs are our priority.  As part of our continuing mission to provide you with exceptional heart care, we have created designated Provider Care Teams.  These Care Teams include your primary Cardiologist (physician) and Advanced Practice Providers (APPs -  Physician Assistants and Nurse Practitioners) who all work together to provide you with the care you need, when you need it.  We recommend signing up for the patient portal called "MyChart".  Sign up information is provided on this After Visit Summary.  MyChart is used to connect with patients for Virtual Visits (Telemedicine).  Patients are able to view lab/test results, encounter notes, upcoming appointments, etc.  Non-urgent messages can be sent to your provider as well.   To learn more about what you can do with MyChart, go to [email or web address].    Your next appointment:    August   The format for your next appointment:   In Person  Provider:   [name], MD    Other Instructions Thank you for choosing [facility]!    Important Information About [place]

## 2021-12-21 DIAGNOSIS — M1711 Unilateral primary osteoarthritis, right knee: Secondary | ICD-10-CM | POA: Diagnosis not present

## 2021-12-21 DIAGNOSIS — S83412D Sprain of medial collateral ligament of left knee, subsequent encounter: Secondary | ICD-10-CM | POA: Diagnosis not present

## 2021-12-29 ENCOUNTER — Ambulatory Visit (INDEPENDENT_AMBULATORY_CARE_PROVIDER_SITE_OTHER): Payer: Medicare HMO | Admitting: Gastroenterology

## 2021-12-29 ENCOUNTER — Encounter: Payer: Self-pay | Admitting: Gastroenterology

## 2021-12-29 VITALS — BP 110/78 | HR 87 | Temp 98.1°F | Ht 63.5 in | Wt 149.8 lb

## 2021-12-29 DIAGNOSIS — K921 Melena: Secondary | ICD-10-CM

## 2021-12-29 DIAGNOSIS — K5900 Constipation, unspecified: Secondary | ICD-10-CM

## 2021-12-29 NOTE — Progress Notes (Unsigned)
GI Office Note    Referring Provider: [name], MD Primary Care Physician:  [name], MD  Primary Gastroenterologist: Garfield Cornea, MD   Chief Complaint   Chief Complaint  Patient presents with   Follow-up    Taking linzess 85mg every other day, bm yesterday states it was a big black mass of stool. Worried about bleeding ulcer that she had in past.     History of Present Illness   Amber NOXONis a 76y.o. female presenting today for follow-up.  Last seen in May 2023.  History of constipation, treated with Linzess.  GERD controlled with pantoprazole.  Last colonoscopy 2017 with diverticulosis, no plans for future screening colonoscopy due to age.  EGD 2020, mild Schatzki ring status post dilation, small hiatal hernia, bleeding erosive gastropathy with gastritis on biopsy, no H. pylori.  Taking Linzess 724m every other day. Seems to be working well. No more stomach pain, bloating, constipation. Usually has one large stool every morning and possible second later in the day. Over the past two weeks, having 3-4 stools per day. Stools have been loose. She worried she may have gotten food poisoning from romaine lettuce. She passed a large black stools on occasion over the past couple of weeks.   She denies Pepto. She had bleeding ulcer in the 1970s, required blood transfusion. She denies nosebleeds. She has had some right sinus issues, and left ear pain and is scheduled to see ENT.   She reports that she is still intermittently taking Linzess, tries to hold dose if loose stool within 24 hours. Feels awful lately. Notes also has been prescribed ibuprofen '400mg'$  BID for knee pain and took for 3 days but stopped due to black stools. Also took amoxicillin earlier this month for her ear. Reports having labs earlier this month.   Medications   Current Outpatient Medications  Medication Sig Dispense Refill   aspirin EC 81 MG tablet Take 1 tablet (81 mg total) by mouth  daily. Swallow whole. 90 tablet 3   atorvastatin (LIPITOR) 20 MG tablet Take 20 mg by mouth daily.     cetirizine (ZYRTEC) 10 MG tablet Take 10-20 mg by mouth at bedtime.     Cholecalciferol (VITAMIN D3) 125 MCG (5000 UT) TABS Take 5,000 Units by mouth daily.     LINZESS 72 MCG capsule TAKE 1 CAPSULE BY MOUTH ONCE DAILY BEFORE BREAKFAST (Patient taking differently: Take 72 mcg by mouth every other day.) 90 capsule 1   metoprolol succinate (TOPROL XL) 25 MG 24 hr tablet Take 1 tablet (25 mg total) by mouth daily. 90 tablet 3   pantoprazole (PROTONIX) 40 MG tablet Take 1 tablet (40 mg total) by mouth daily. 90 tablet 3   zolpidem (AMBIEN) 5 MG tablet Take 5 mg by mouth at bedtime.     No current facility-administered medications for this visit.    Allergies   Allergies as of 12/29/2021   (No Known Allergies)    Review of Systems   General: Negative for anorexia, weight loss, fever, chills, fatigue, weakness. See hpi ENT: Negative for hoarseness, difficulty swallowing , nasal congestion. CV: Negative for chest pain, angina, palpitations, dyspnea on exertion, peripheral edema.  Respiratory: Negative for dyspnea at rest, dyspnea on exertion, cough, sputum, wheezing.  GI: See history of present illness. GU:  Negative for dysuria, hematuria, urinary incontinence, urinary frequency, nocturnal urination.  Endo: Negative for unusual weight change.     Physical Exam   BP 110/78 (  BP Location: Right Arm, Patient Position: Sitting, Cuff Size: Normal)   Pulse 87   Temp 98.1 F (36.7 C) (Oral)   Ht 5' 3.5" (1.613 m)   Wt 149 lb 12.8 oz (67.9 kg)   SpO2 97%   BMI 26.12 kg/m    General: Well-nourished, well-developed in no acute distress.  Eyes: No icterus. Mouth: Oropharyngeal mucosa moist and pink , no lesions erythema or exudate. Lungs: Clear to auscultation bilaterally.  Heart: Regular rate and rhythm, no murmurs rubs or gallops.  Abdomen: Bowel sounds are normal, nontender,  nondistended, no hepatosplenomegaly or masses,  no abdominal bruits or hernia , no rebound or guarding.  Rectal: not performed Extremities: No lower extremity edema. No clubbing or deformities. Neuro: Alert and oriented x 4   Skin: Warm and dry, no jaundice.   Psych: Alert and cooperative, normal mood and affect.  Labs   None available.   Imaging Studies   No results found.  Assessment   Constipation: was doing well on Linzess every other day. Currently having increased stools/diarrhea, not typical Linzess response for her. She has reduced dose but still taking intermittently over past couple of weeks. Reports several episodes of black liquid stool, ?melena. She is concerned due to remote history of bleeding ulcers and recent limited nsaid use. No overt clinical signs of anemia today. Will update labs.    PLAN   Stop Linzess for now to better evaluate if having persistent loose stools. If persistent, then would recommend stool studies.  Labs for CBC, iron/tibc/ferritin.    Laureen Ochs. Bobby Rumpf, [place], [facility]

## 2021-12-29 NOTE — Patient Instructions (Signed)
Please stop Linzess for now to better evaluate if your are having loose stools from Linzess versus infection. Please let me know if your stools continue to be loose, we will order stool studies if persistent. Please have labs done tomorrow at [facility]. Please take order forms with you. We will be in touch with results as available.

## 2021-12-30 ENCOUNTER — Ambulatory Visit (HOSPITAL_COMMUNITY)
Admission: RE | Admit: 2021-12-30 | Discharge: 2021-12-30 | Disposition: A | Discharge status: Home / Self Care | Payer: Medicare HMO | Source: Ambulatory Visit | Attending: Family Medicine | Admitting: Family Medicine

## 2021-12-30 ENCOUNTER — Other Ambulatory Visit (HOSPITAL_COMMUNITY)
Admission: RE | Admit: 2021-12-30 | Discharge: 2021-12-30 | Disposition: A | Discharge status: Home / Self Care | Payer: Medicare HMO | Source: Ambulatory Visit | Attending: Family Medicine | Admitting: Family Medicine

## 2021-12-30 DIAGNOSIS — K5900 Constipation, unspecified: Secondary | ICD-10-CM | POA: Diagnosis not present

## 2021-12-30 DIAGNOSIS — Z803 Family history of malignant neoplasm of breast: Secondary | ICD-10-CM | POA: Diagnosis not present

## 2021-12-30 DIAGNOSIS — M79622 Pain in left upper arm: Secondary | ICD-10-CM | POA: Diagnosis not present

## 2021-12-30 DIAGNOSIS — I059 Rheumatic mitral valve disease, unspecified: Secondary | ICD-10-CM | POA: Diagnosis not present

## 2021-12-30 DIAGNOSIS — N644 Mastodynia: Secondary | ICD-10-CM | POA: Insufficient documentation

## 2021-12-30 DIAGNOSIS — K921 Melena: Secondary | ICD-10-CM | POA: Diagnosis not present

## 2021-12-30 LAB — CBC WITH DIFFERENTIAL/PLATELET
Abs Immature Granulocytes: 0.01 10*3/uL (ref 0.00–0.07)
Basophils Absolute: 0.1 10*3/uL (ref 0.0–0.1)
Basophils Relative: 1 %
Eosinophils Absolute: 0.1 10*3/uL (ref 0.0–0.5)
Eosinophils Relative: 2 %
HCT: 44.2 % (ref 36.0–46.0)
Hemoglobin: 14.7 g/dL (ref 12.0–15.0)
Immature Granulocytes: 0 %
Lymphocytes Relative: 11 %
Lymphs Abs: 0.8 10*3/uL (ref 0.7–4.0)
MCH: 30.8 pg (ref 26.0–34.0)
MCHC: 33.3 g/dL (ref 30.0–36.0)
MCV: 92.5 fL (ref 80.0–100.0)
Monocytes Absolute: 0.6 10*3/uL (ref 0.1–1.0)
Monocytes Relative: 8 %
Neutro Abs: 5.5 10*3/uL (ref 1.7–7.7)
Neutrophils Relative %: 78 %
Platelets: 287 10*3/uL (ref 150–400)
RBC: 4.78 MIL/uL (ref 3.87–5.11)
RDW: 12.9 % (ref 11.5–15.5)
WBC: 7 10*3/uL (ref 4.0–10.5)
nRBC: 0 % (ref 0.0–0.2)

## 2021-12-30 LAB — IRON AND TIBC
Iron: 88 ug/dL (ref 28–170)
Saturation Ratios: 30 % (ref 10.4–31.8)
TIBC: 295 ug/dL (ref 250–450)
UIBC: 207 ug/dL

## 2021-12-30 LAB — FERRITIN: Ferritin: 84 ng/mL (ref 11–307)

## 2022-01-04 ENCOUNTER — Encounter: Payer: Self-pay | Admitting: Pulmonary Disease

## 2022-01-04 ENCOUNTER — Ambulatory Visit (INDEPENDENT_AMBULATORY_CARE_PROVIDER_SITE_OTHER): Payer: Medicare HMO | Admitting: Pulmonary Disease

## 2022-01-04 VITALS — BP 112/64 | HR 84 | Temp 98.1°F | Ht 63.5 in | Wt 150.8 lb

## 2022-01-04 DIAGNOSIS — J849 Interstitial pulmonary disease, unspecified: Secondary | ICD-10-CM | POA: Diagnosis not present

## 2022-01-04 DIAGNOSIS — D869 Sarcoidosis, unspecified: Secondary | ICD-10-CM

## 2022-01-04 NOTE — Progress Notes (Signed)
Amber Bird    [id number]    02/16/1945  Primary Care Physician:Hagler, Apolonio Schneiders, MD  Referring Physician: [name], [place],  [place] [address]  Chief complaint: Follow up for mold exposure, abnormal CT scan  HPI: 76 y.o.  with history of asthma, depression, seasonal allergies Evaluated in January 2019 for abnormal CT scan by Dr. Lake Bells. Patient was concerned for mold exposure since her dog died of lung issues but there is no confirmed mold issues in her home.  CT scan at that time with findings of perilymphatic nodularity, calcified lymph nodes most suggestive of sarcoidosis.  She is scheduled for bronchoscope in 2021 but was canceled due to unavailability of transport and patient did not follow-up as she is feeling well.  Since then she has moved to a new home in October 2019.  She has recently found out that she has confirmed mold in crawl spaces of her new home and wants to be reevaluated.  Reports worsening dyspnea over 6 months.  No cough, sputum production, fevers, chills.  Pets: 2 cats, dog.  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  No hot tubs, Jacuzzi's, down pillows or comforters Smoking history: Never smoker Travel history: Originally from [place] and [place].  No significant recent travel Relevant family history: No significant family history of lung disease  Interim history: She is back in clinic after gap of 2 and half years.  Referred back by primary care for evaluation of the lung States that breathing overall is stable with no issues  In the interim she has had a diagnosis of mitral valve prolapse with severe MR and had a mitral valve replacement at [facility] in October 2022.  Also developed atrial fibrillation for which she is on rate control.  Outpatient Encounter Medications as of 01/04/2022  Medication Sig   aspirin EC 81 MG tablet Take 1 tablet (81  mg total) by mouth daily. Swallow whole.   atorvastatin (LIPITOR) 20 MG tablet Take 20 mg by mouth daily.   cetirizine (ZYRTEC) 10 MG tablet Take 10-20 mg by mouth at bedtime.   Cholecalciferol (VITAMIN D3) 125 MCG (5000 UT) TABS Take 5,000 Units by mouth daily.   metoprolol succinate (TOPROL XL) 25 MG 24 hr tablet Take 1 tablet (25 mg total) by mouth daily.   pantoprazole (PROTONIX) 40 MG tablet Take 1 tablet (40 mg total) by mouth daily.   zolpidem (AMBIEN) 5 MG tablet Take 5 mg by mouth at bedtime.   LINZESS 72 MCG capsule TAKE 1 CAPSULE BY MOUTH ONCE DAILY BEFORE BREAKFAST (Patient not taking: Reported on 01/04/2022)   No facility-administered encounter medications on file as of 01/04/2022.   Physical Exam: Blood pressure 112/64, pulse 84, temperature 98.1 F (36.7 C), temperature source Oral, height 5' 3.5" (1.613 m), weight 150 lb 12.8 oz (68.4 kg), SpO2 97 %. Gen:      No acute distress HEENT:  EOMI, sclera anicteric Neck:     No masses; no thyromegaly Lungs:    Clear to auscultation bilaterally; normal respiratory effort CV:         Regular rate and rhythm; no murmurs Abd:      + bowel sounds; soft, non-tender; no palpable masses, no distension Ext:    No edema; adequate peripheral perfusion Skin:      Warm and dry; no rash Neuro: alert and oriented x 3  Psych: normal mood and affect   Data Reviewed: Imaging: CT high-resolution 02/23/2017-bilateral pulmonary nodularity in peribronchovascular and subpleural distribution in the upper lobes with calcified mediastinal, hilar lymph nodes.  CT abdomen pelvis 09/07/2018-atelectasis at lung bases.  CT chest 07/16/2019-stable CT chest with findings compatible with sarcoidosis.  Patchy air trapping. I have reviewed the images personally.  PFTs: 02/15/2017 FVC 3.1 [91%], FEV1 2.4 [91%], F/F 76, Normal test  06/20/2019 FVC 2.48 [87%), FEV1 1.99 (93%), F/F 80, TLC 77%, DLCO 87% Minimal obstructive airway disease, mild  restriction  Labs: Aspergillus, pigeon antibodies 03/01/2017-negative  Angiotensin-converting enzyme 06/19/2019-24 Hypersensitivity panel 06/19/2019-negative Respiratory allergy profile, mold panel 06/19/2019-mild reaction to Aspergillus, cat dander CBC 06/15/2019-WBC 5.9, eos 1.5%, absolute eosinophil count 88.5  Assessment:  Abnormal CT, mold exposure Presumed sarcoidosis CT scan does not look like hypersensitivity pneumonitis.  It is more consistent with sarcoidosis and has remained stable since 2019 Labs show mild reaction to mold but CT scan is not typical of any mold issues.  Bronchoscopy scheduled in 2021 was canceled as she lives alone and cannot get anybody to drive her.  Since has been a long time since we had a lung assessment we will start off by repeating high-res CT and PFTs.  Plan/Recommendations: High-resolution CT, PFTs  Marshell Garfinkel MD [facility] 01/04/2022, 2:26 PM  CC: [name], MD

## 2022-01-04 NOTE — Patient Instructions (Signed)
Will get a CT of the chest and pulmonary function test for better evaluation of the lung Follow-up in clinic in 2 to 3 months after PFTs for review and plan for next steps.

## 2022-01-07 ENCOUNTER — Telehealth: Payer: Self-pay

## 2022-01-07 NOTE — Telephone Encounter (Signed)
Patient called the office at [facility] wanting her 12/30/2021 lab results I made her aware of what Magda Paganini had suggested of  Please let pt know her Hgb is normal at 14.7. Iron and ferritin normal.   Call if persistent diarrhea off Linzess. Call if recurrent black stools.   Ov in 3 months, patient states understanding.  She says she has been having some periumbilical pain since being off the Linzess she says she does not feel she is able to have a complete bm. She has 1-2 bm per day, but they are not a large amount. This pain has been ongoing for the last 10 days or so. She does not take any other type of anti constipation medications. She has not seen any dark or blood stools. Please advise.

## 2022-01-07 NOTE — Telephone Encounter (Signed)
Sounds like diarrhea resolved and not having complete BMs. She can restart Linzess daily as needed. If pain does not settle down, she may require imaging. Have her update [place] first of next week.

## 2022-01-08 NOTE — Telephone Encounter (Signed)
Communication noted.  

## 2022-01-08 NOTE — Telephone Encounter (Signed)
I called and left a detailed message advised that she may resume taking Linzess daily as needed. I asked that she please return call to the [address] location with an update at the first of the week next week.

## 2022-01-08 NOTE — Telephone Encounter (Signed)
Hey Tammy please see the below message, Thanks

## 2022-01-12 ENCOUNTER — Ambulatory Visit (HOSPITAL_COMMUNITY)
Admission: RE | Admit: 2022-01-12 | Discharge: 2022-01-12 | Disposition: A | Discharge status: Home / Self Care | Payer: Medicare HMO | Source: Ambulatory Visit | Attending: Family Medicine | Admitting: Family Medicine

## 2022-01-12 DIAGNOSIS — M810 Age-related osteoporosis without current pathological fracture: Secondary | ICD-10-CM | POA: Insufficient documentation

## 2022-01-20 ENCOUNTER — Ambulatory Visit (INDEPENDENT_AMBULATORY_CARE_PROVIDER_SITE_OTHER): Payer: Medicare HMO | Admitting: Orthopaedic Surgery

## 2022-01-20 ENCOUNTER — Encounter: Payer: Self-pay | Admitting: Orthopaedic Surgery

## 2022-01-20 DIAGNOSIS — M810 Age-related osteoporosis without current pathological fracture: Secondary | ICD-10-CM | POA: Insufficient documentation

## 2022-01-20 DIAGNOSIS — M545 Low back pain, unspecified: Secondary | ICD-10-CM | POA: Diagnosis not present

## 2022-01-20 DIAGNOSIS — G8929 Other chronic pain: Secondary | ICD-10-CM | POA: Diagnosis not present

## 2022-01-20 NOTE — Progress Notes (Signed)
Office Visit Note   Patient: Amber Bird           Date of Birth: 02/10/1945           MRN: [id number] Visit Date: 01/20/2022              Requested by: [name], MD [address],  [place] [address] PCP: [name], MD   Assessment & Plan: Visit Diagnoses:  1. Chronic bilateral low back pain without sciatica   2. Age-related osteoporosis without current pathological fracture     Plan: [name] was seen in the [facility] office in February for evaluation of low back pain.  She notes that she is presently quite comfortable and not having any issues with pain or radiculopathy.  She is using a treadmill.  She does not have any specific back exercises were given her to set that she can use and ask her to follow-up with [place] if she has any further problems.  She wanted to spend more time today discussing her recent diagnosis of osteoporosis.  She had a bone density study through her primary care physician it has been recommended that she have an injectable as her osteoporosis is fairly significant.  I did read her report and she is in the area of -4 to -5 with normal being less than -1.  This does put her in the category of osteoporosis and I certainly would suggest that she follow-up with her primary care physician for treatment.  She has not had a specific fracture but certainly is at increased risk.  She has been on vitamin D and calcium for years and apparently that is just "not enough".  We will plan to see her back as needed  Follow-Up Instructions: Return if symptoms worsen or fail to improve.   Orders:  No orders of the defined types were placed in this encounter.  No orders of the defined types were placed in this encounter.     Procedures: No procedures performed   Clinical Data: No additional findings.   Subjective: Chief Complaint  Patient presents with   Lower Back - Follow-up  Amber Bird and follows up for history of low back pain.  Presently  she is doing well and not having any pain or referred discomfort.  She did want to get some exercises which will be provided.  She also was recently diagnosed with osteoporosis and wanted to discuss that as well as her report  HPI  Review of Systems   Objective: Vital Signs: There were no vitals taken for this visit.  Physical Exam Constitutional:      Appearance: She is well-developed.  Eyes:     Pupils: Pupils are equal, round, and reactive to light.  Pulmonary:     Effort: Pulmonary effort is normal.  Skin:    General: Skin is warm and dry.  Neurological:     Mental Status: She is alert and oriented to person, place, and time.  Psychiatric:        Behavior: Behavior normal.     Ortho Exam awake alert and oriented x 3.  Comfortable sitting.  Walks without a limp..  Able to get up and down easily from sitting position without any trouble.  Straight leg raise negative.  No percussible tenderness lumbar spine.  Motor exam appears to be intact.  No neck pain.  Specialty Comments:  No specialty comments available.  Imaging: No results found.   PMFS History: Patient Active Problem  List   Diagnosis Date Noted   Age-related osteoporosis without current pathological fracture 01/20/2022   Melena 12/29/2021   Rectal pain 06/24/2021   Low back pain 03/25/2021   CAD (coronary artery disease) 09/19/2020   Severe mitral insufficiency 09/18/2020   Mitral valve prolapse 09/18/2020   Nonrheumatic mitral valve regurgitation 09/18/2020   Constipation 06/20/2019   Osteoarthritis of knees, bilateral 05/23/2019   Odynophagia 10/02/2018   Observation after surgery 10/02/2018   Esophageal dysphagia    Gastroesophageal reflux disease    Chronic cholecystitis with calculus s/p lap cholecystectomy 05/12/2016 05/12/2016   History of bleeding ulcers    Multiple thyroid nodules    Seasonal allergies    Lower abdominal pain 01/18/2016   Past Medical History:  Diagnosis Date   Anemia     Asthma    CAD (coronary artery disease) 0/73/7106   Complication of anesthesia    "drop in blood pressure years ago after hysterectomy "    Depression    Dyspnea    Dysrhythmia    History of bleeding ulcers    Hypercholesteremia    Multiple thyroid nodules    Osteoarthritis of knees, bilateral 05/23/2019   Seasonal allergies     Family History  Problem Relation Age of Onset   Colon cancer Other    Breast cancer Mother    Tuberculosis Mother    Heart disease Father     Past Surgical History:  Procedure Laterality Date   ABDOMINAL HYSTERECTOMY     complete.    BIOPSY  10/02/2018   Procedure: BIOPSY;  Surgeon: Daneil Dolin, MD;  Location: [facility];  Service: Endoscopy;;  gastric   bleeding ulcer     CATARACT EXTRACTION W/PHACO Left 06/11/2019   Procedure: CATARACT EXTRACTION PHACO AND INTRAOCULAR LENS PLACEMENT (IOC) CDE: 4.63;  Surgeon: Baruch Goldmann, MD;  Location: [facility];  Service: Ophthalmology;  Laterality: Left;   CATARACT EXTRACTION W/PHACO Right 06/25/2019   Procedure: CATARACT EXTRACTION PHACO AND INTRAOCULAR LENS PLACEMENT RIGHT EYE;  Surgeon: Baruch Goldmann, MD;  Location: [facility];  Service: Ophthalmology;  Laterality: Right;  CDE: 5.61   CHOLECYSTECTOMY     COLONOSCOPY N/A 12/18/2015   Rourk: diverticulosis in sigmoid colon, otherwise normal    Cyst removed from ovary  1973   dental implants     ESOPHAGOGASTRODUODENOSCOPY (EGD) WITH PROPOFOL N/A 10/02/2018   Mild Schatzki ring status post dilation, small hiatal hernia, bleeding erosive gastropathy, gastritis on bx, no h pylori.  Procedure: ESOPHAGOGASTRODUODENOSCOPY (EGD) WITH PROPOFOL;  Surgeon: Daneil Dolin, MD;  Location: [facility];  Service: Endoscopy;  Laterality: N/A;  12:45pm   LAPAROSCOPIC CHOLECYSTECTOMY SINGLE SITE WITH INTRAOPERATIVE CHOLANGIOGRAM N/A 05/12/2016   Procedure: LAPAROSCOPIC CHOLECYSTECTOMY SINGLE SITE WITH INTRAOPERATIVE CHOLANGIOGRAM;  Surgeon: Michael Boston, MD;  Location: [facility]  ORS;  Service: General;  Laterality: N/A;   left hammer toe repair     MALONEY DILATION N/A 10/02/2018   Procedure: Venia Minks DILATION;  Surgeon: Daneil Dolin, MD;  Location: [facility];  Service: Endoscopy;  Laterality: N/A;   RIGHT/LEFT HEART CATH AND CORONARY ANGIOGRAPHY N/A 09/18/2020   Procedure: RIGHT/LEFT HEART CATH AND CORONARY ANGIOGRAPHY;  Surgeon: Martinique, Jaylun Fleener M, MD;  Location: [facility];  Service: Cardiovascular;  Laterality: N/A;   TEE WITHOUT CARDIOVERSION N/A 09/18/2020   Procedure: TRANSESOPHAGEAL ECHOCARDIOGRAM (TEE);  Surgeon: Josue Hector, MD;  Location: [facility];  Service: Cardiovascular;  Laterality: N/A;   thyroid needle biopsy     VIDEO BRONCHOSCOPY  Bilateral 03/21/2017   Did not have done   Social History   Occupational History   Not on file  Tobacco Use   Smoking status: Never    Passive exposure: Past   Smokeless tobacco: Never  Vaping Use   Vaping Use: Never used  Substance and Sexual Activity   Alcohol use: No   Drug use: No   Sexual activity: Not Currently    Birth control/protection: None     Garald Balding, MD   Note - This record has been created using [employer].  Chart creation errors have been sought, but may not always  have been located. Such creation errors do not reflect on  the standard of medical care.

## 2022-01-29 ENCOUNTER — Ambulatory Visit (HOSPITAL_COMMUNITY)
Admission: RE | Admit: 2022-01-29 | Discharge: 2022-01-29 | Disposition: A | Discharge status: Home / Self Care | Payer: Medicare HMO | Source: Ambulatory Visit | Attending: Pulmonary Disease | Admitting: Pulmonary Disease

## 2022-01-29 DIAGNOSIS — D869 Sarcoidosis, unspecified: Secondary | ICD-10-CM | POA: Insufficient documentation

## 2022-01-29 DIAGNOSIS — I2510 Atherosclerotic heart disease of native coronary artery without angina pectoris: Secondary | ICD-10-CM | POA: Diagnosis not present

## 2022-01-29 DIAGNOSIS — R911 Solitary pulmonary nodule: Secondary | ICD-10-CM | POA: Diagnosis not present

## 2022-01-29 DIAGNOSIS — I700 Atherosclerosis of aorta: Secondary | ICD-10-CM | POA: Diagnosis not present

## 2022-02-04 DIAGNOSIS — Z79899 Other long term (current) drug therapy: Secondary | ICD-10-CM | POA: Diagnosis not present

## 2022-02-04 DIAGNOSIS — M810 Age-related osteoporosis without current pathological fracture: Secondary | ICD-10-CM | POA: Diagnosis not present

## 2022-02-18 ENCOUNTER — Telehealth: Payer: Self-pay | Admitting: Pulmonary Disease

## 2022-02-18 NOTE — Telephone Encounter (Signed)
Called and spoke with patient.  Patient requested her Ct results from 01/29/22. Patient aware Dr. Vaughan Browner is not in office this week and someone will call her with results.   Message routed to Dr. Vaughan Browner for CT results

## 2022-02-23 ENCOUNTER — Telehealth: Payer: Self-pay | Admitting: Internal Medicine

## 2022-02-23 NOTE — Telephone Encounter (Signed)
I spoke with the pt and she has lower back pain from years of horse back riding... she will try Tylenol and warm heat for now and if no relief she will call her Orthopaedic MD and/or PCP.

## 2022-02-23 NOTE — Telephone Encounter (Signed)
Patient calling to see what pain relieve medication she can take. Please advise

## 2022-02-24 ENCOUNTER — Encounter: Payer: Self-pay | Admitting: *Deleted

## 2022-02-24 ENCOUNTER — Encounter: Payer: Self-pay | Admitting: Gastroenterology

## 2022-02-24 ENCOUNTER — Ambulatory Visit (INDEPENDENT_AMBULATORY_CARE_PROVIDER_SITE_OTHER): Payer: Medicare HMO | Admitting: Gastroenterology

## 2022-02-24 VITALS — BP 114/73 | HR 84 | Temp 97.7°F | Ht 63.5 in | Wt 150.6 lb

## 2022-02-24 DIAGNOSIS — R198 Other specified symptoms and signs involving the digestive system and abdomen: Secondary | ICD-10-CM | POA: Insufficient documentation

## 2022-02-24 DIAGNOSIS — K6289 Other specified diseases of anus and rectum: Secondary | ICD-10-CM | POA: Diagnosis not present

## 2022-02-24 NOTE — Progress Notes (Signed)
GI Office Note    Referring Provider: [name], MD Primary Care Physician:  [name], MD  Primary Gastroenterologist: Garfield Cornea, MD   Chief Complaint   Chief Complaint  Patient presents with   Rectal Pain    States that her stools are large and thin. Stopped linzess months ago due to it causing diarrhea. Also is having issues with bloating.    History of Present Illness   Amber Bird is a 77 y.o. female presenting today for follow-up.  Last seen November 2023.  History of constipation, GERD.  Last colonoscopy with diverticulosis, 2017.  EGD 2020, mild Schatzki ring status post dilation, small hiatal hernia, bleeding erosive gastropathy with gastritis on biopsy, no H. pylori.  History of bleeding ulcers in the 70s, requiring blood transfusion.  Last office visit she had complained of a couple of episodes of melena.  Labs showed hemoglobin of 14.7, iron 88, TIBC 295, iron saturation is 30%, ferritin 84.  Patient presents today with concerns for rectal pain and change in bowels. No longer on Linzess because it was causing diarrhea. She notes that she went from needed something for constipation to having increased stool frequency. Stool caliber much thinner than before. Passing stool 2-4 times daily. Large quantity of stool at a time but remains formed. Stools dark in color. She has noted pain on left side of rectum, shooting pain lasting for less than a minute at a time. Pain occurs without BM. Does not feel like hemorrhoid pain. She used some hemorrhoid cream without relief, once or twice. She notes rectal pain when on the treadmill or just sitting. She is up 9 pounds since 09/2021. Limits NSAIDS. No heartburn. No dysphagia.   Medications   Current Outpatient Medications  Medication Sig Dispense Refill   aspirin EC 81 MG tablet Take 1 tablet (81 mg total) by mouth daily. Swallow whole. 90 tablet 3   atorvastatin (LIPITOR) 20 MG tablet Take 20 mg by mouth daily.      CALCIUM-MAGNESIUM-ZINC PO Take by mouth.     cetirizine (ZYRTEC) 10 MG tablet Take 10-20 mg by mouth at bedtime.     Cholecalciferol (VITAMIN D3) 125 MCG (5000 UT) TABS Take 5,000 Units by mouth every other day.     metoprolol succinate (TOPROL XL) 25 MG 24 hr tablet Take 1 tablet (25 mg total) by mouth daily. 90 tablet 3   pantoprazole (PROTONIX) 40 MG tablet Take 1 tablet (40 mg total) by mouth daily. 90 tablet 3   zolpidem (AMBIEN) 5 MG tablet Take 5 mg by mouth at bedtime.     No current facility-administered medications for this visit.    Allergies   Allergies as of 02/24/2022   (No Known Allergies)     Past Medical History   Past Medical History:  Diagnosis Date   Anemia    Asthma    CAD (coronary artery disease) 0000000   Complication of anesthesia    "drop in blood pressure years ago after hysterectomy "    Depression    Dyspnea    Dysrhythmia    History of bleeding ulcers    Hypercholesteremia    Multiple thyroid nodules    Osteoarthritis of knees, bilateral 05/23/2019   Sarcoidosis    Seasonal allergies     Past Surgical History   Past Surgical History:  Procedure Laterality Date   ABDOMINAL HYSTERECTOMY     complete.    BIOPSY  10/02/2018   Procedure: BIOPSY;  Surgeon: Daneil Dolin, MD;  Location: [facility];  Service: Endoscopy;;  gastric   bleeding ulcer     CATARACT EXTRACTION W/PHACO Left 06/11/2019   Procedure: CATARACT EXTRACTION PHACO AND INTRAOCULAR LENS PLACEMENT (IOC) CDE: 4.63;  Surgeon: Baruch Goldmann, MD;  Location: [facility];  Service: Ophthalmology;  Laterality: Left;   CATARACT EXTRACTION W/PHACO Right 06/25/2019   Procedure: CATARACT EXTRACTION PHACO AND INTRAOCULAR LENS PLACEMENT RIGHT EYE;  Surgeon: Baruch Goldmann, MD;  Location: [facility];  Service: Ophthalmology;  Laterality: Right;  CDE: 5.61   CHOLECYSTECTOMY     COLONOSCOPY N/A 12/18/2015   Rourk: diverticulosis in sigmoid colon, otherwise normal    Cyst removed from ovary   1973   dental implants     ESOPHAGOGASTRODUODENOSCOPY (EGD) WITH PROPOFOL N/A 10/02/2018   Mild Schatzki ring status post dilation, small hiatal hernia, bleeding erosive gastropathy, gastritis on bx, no h pylori.  Procedure: ESOPHAGOGASTRODUODENOSCOPY (EGD) WITH PROPOFOL;  Surgeon: Daneil Dolin, MD;  Location: [facility];  Service: Endoscopy;  Laterality: N/A;  12:45pm   LAPAROSCOPIC CHOLECYSTECTOMY SINGLE SITE WITH INTRAOPERATIVE CHOLANGIOGRAM N/A 05/12/2016   Procedure: LAPAROSCOPIC CHOLECYSTECTOMY SINGLE SITE WITH INTRAOPERATIVE CHOLANGIOGRAM;  Surgeon: Michael Boston, MD;  Location: [facility];  Service: General;  Laterality: N/A;   left hammer toe repair     MALONEY DILATION N/A 10/02/2018   Procedure: Venia Minks DILATION;  Surgeon: Daneil Dolin, MD;  Location: [facility];  Service: Endoscopy;  Laterality: N/A;   RIGHT/LEFT HEART CATH AND CORONARY ANGIOGRAPHY N/A 09/18/2020   Procedure: RIGHT/LEFT HEART CATH AND CORONARY ANGIOGRAPHY;  Surgeon: Martinique, Peter M, MD;  Location: [facility];  Service: Cardiovascular;  Laterality: N/A;   TEE WITHOUT CARDIOVERSION N/A 09/18/2020   Procedure: TRANSESOPHAGEAL ECHOCARDIOGRAM (TEE);  Surgeon: Josue Hector, MD;  Location: [facility];  Service: Cardiovascular;  Laterality: N/A;   thyroid needle biopsy     VIDEO BRONCHOSCOPY Bilateral 03/21/2017   Did not have done    Past Family History   Family History  Problem Relation Age of Onset   Colon cancer Other    Breast cancer Mother    Tuberculosis Mother    Heart disease Father     Past Social History   Social History   Socioeconomic History   Marital status: Divorced    Spouse name: Not on file   Number of children: Not on file   Years of education: Not on file   Highest education level: Not on file  Occupational History   Not on file  Tobacco Use   Smoking status: Never    Passive exposure: Past   Smokeless tobacco: Never  Vaping Use   Vaping Use: Never used   Substance and Sexual Activity   Alcohol use: No   Drug use: No   Sexual activity: Not Currently    Birth control/protection: None  Other Topics Concern   Not on file  Social History Narrative   Moved from [facility] 2009. In 2008 when the economy dropped lost job. Reports that after lost job was hard to get another job.  Is not married and does not have children. Used to enjoy horses. Reports that moved here b/c it was a lot cheaper and warmer. Had visited here for a horse event in 1990 so decided to move here. Lives in [place], [place]. Reports that made stupid financial decisions and is dealing with that at this time.    Social Determinants of Health   Financial Resource Strain: Medium  Risk (08/29/2020)   Overall Financial Resource Strain (CARDIA)    Difficulty of Paying Living Expenses: Somewhat hard  Food Insecurity: No Food Insecurity (08/29/2020)   Hunger Vital Sign    Worried About Running Out of Food in the Last Year: Never true    Ran Out of Food in the Last Year: Never true  Transportation Needs: No Transportation Needs (08/29/2020)   PRAPARE - Hydrologist (Medical): No    Lack of Transportation (Non-Medical): No  Physical Activity: Not on file  Stress: Not on file  Social Connections: Not on file  Intimate Partner Violence: Not on file    Review of Systems   General: Negative for anorexia, weight loss, fever, chills, fatigue, weakness. ENT: Negative for hoarseness, difficulty swallowing , nasal congestion. CV: Negative for chest pain, angina, palpitations, dyspnea on exertion, peripheral edema.  Respiratory: Negative for dyspnea at rest, dyspnea on exertion, cough, sputum, wheezing.  GI: See history of present illness. GU:  Negative for dysuria, hematuria, urinary incontinence, urinary frequency, nocturnal urination.  Endo: Negative for unusual weight change.     Physical Exam   BP 114/73 (BP Location: Right Arm, Patient Position: Sitting, Cuff  Size: Normal)   Pulse 84   Temp 97.7 F (36.5 C) (Oral)   Ht 5' 3.5" (1.613 m)   Wt 150 lb 9.6 oz (68.3 kg)   SpO2 98%   BMI 26.26 kg/m    General: Well-nourished, well-developed in no acute distress.  Eyes: No icterus. Mouth: Oropharyngeal mucosa moist and pink   Lungs: Clear to auscultation bilaterally.  Heart: Regular rate and rhythm, no murmurs rubs or gallops.  Abdomen: Bowel sounds are normal, nontender, nondistended, no hepatosplenomegaly or masses,  no abdominal bruits or hernia , no rebound or guarding.  Rectal: mildly tender exam, ?small palpable hemorrhoid. No mass or fissure noted.  Extremities: No lower extremity edema. No clubbing or deformities. Neuro: Alert and oriented x 4   Skin: Warm and dry, no jaundice.   Psych: Alert and cooperative, normal mood and affect.  Labs   Lab Results  Component Value Date   CREATININE 0.90 01/23/2021   BUN 15 01/23/2021   NA 137 01/23/2021   K 4.2 01/23/2021   CL 106 01/23/2021   CO2 22 01/23/2021   Lab Results  Component Value Date   ALT 23 01/23/2021   AST 26 01/23/2021   ALKPHOS 73 01/23/2021   BILITOT 0.4 01/23/2021   Lab Results  Component Value Date   WBC 7.0 12/30/2021   HGB 14.7 12/30/2021   HCT 44.2 12/30/2021   MCV 92.5 12/30/2021   PLT 287 12/30/2021    Imaging Studies   CT CHEST HIGH RESOLUTION  Result Date: 02/03/2022 CLINICAL DATA:  Sarcoidosis EXAM: CT CHEST WITHOUT CONTRAST TECHNIQUE: Multidetector CT imaging of the chest was performed following the standard protocol without intravenous contrast. High resolution imaging of the lungs, as well as inspiratory and expiratory imaging, was performed. RADIATION DOSE REDUCTION: This exam was performed according to the departmental dose-optimization program which includes automated exposure control, adjustment of the mA and/or kV according to patient size and/or use of iterative reconstruction technique. COMPARISON:  07/16/2019 FINDINGS: Cardiovascular:  Aortic atherosclerosis. Normal heart size. Left and right coronary artery calcifications. No pericardial effusion. Mediastinum/Nodes: Multiple densely calcified, nonenlarged mediastinal and bilateral hilar lymph nodes. Unchanged heterogeneous nodule of the left lobe of the thyroid measuring 2.4 x 1.8 cm. Trachea and esophagus demonstrate no significant findings. Lungs/Pleura: Unchanged  appearance of clustered, confluent primarily perilymphatic nodularity and consolidation of the perihilar lungs, with extensive scarring and atelectasis of the right middle lobe. No significant air trapping on expiratory phase imaging. Unchanged spiculated nodule of the posterior right pulmonary apex measuring 0.7 cm, benign, for which no further follow-up or characterization is required (series 5, image 30). No pleural effusion or pneumothorax. Upper Abdomen: No acute abnormality.  Status post cholecystectomy. Musculoskeletal: No chest wall abnormality. No acute osseous findings. IMPRESSION: 1. Unchanged appearance of clustered, confluent primarily perilymphatic nodularity and consolidation of the perihilar lungs, with extensive scarring and atelectasis of the right middle lobe. 2. Multiple densely calcified, nonenlarged mediastinal and bilateral hilar lymph nodes. 3. Constellation of findings is consistent with pulmonary and nodal sarcoidosis. 4. Coronary artery disease. 5. Unchanged heterogeneous nodule of the left lobe of the thyroid measuring 2.4 x 1.8 cm. Recommend thyroid US (ref: J Am Coll Radiol. 2015 Feb;12(2): 143-50). Aortic Atherosclerosis (ICD10-I70.0). Electronically Signed   By: Delanna Ahmadi M.D.   On: 02/03/2022 11:28    Assessment   Change in bowels/rectal pain: patient notes increased stool frequency and small stool caliber, stark contrast to her constipation. Has been off Linzess for two months when she noted increased frequency and diarrhea. Now having solid stools but going 2-4 times per day. Notes change in  stool caliber. Notes short lived shooting rectal pain, possibly proctagia fugax. Last colonoscopy 2017, would offer updated colonoscopy given bowel changes.    PLAN   Colonoscopy in near future. ASA 3.  I have discussed the risks, alternatives, benefits with regards to but not limited to the risk of reaction to medication, bleeding, infection, perforation and the patient is agreeable to proceed. Written consent to be obtained.    Laureen Ochs. Bobby Rumpf, [place], [facility]

## 2022-02-24 NOTE — H&P (View-Only) (Signed)
GI Office Note    Referring Provider: [name], MD Primary Care Physician:  [name], MD  Primary Gastroenterologist: Garfield Cornea, MD   Chief Complaint   Chief Complaint  Patient presents with   Rectal Pain    States that her stools are large and thin. Stopped linzess months ago due to it causing diarrhea. Also is having issues with bloating.    History of Present Illness   Amber Bird is a 77 y.o. female presenting today for follow-up.  Last seen November 2023.  History of constipation, GERD.  Last colonoscopy with diverticulosis, 2017.  EGD 2020, mild Schatzki ring status post dilation, small hiatal hernia, bleeding erosive gastropathy with gastritis on biopsy, no H. pylori.  History of bleeding ulcers in the 70s, requiring blood transfusion.  Last office visit she had complained of a couple of episodes of melena.  Labs showed hemoglobin of 14.7, iron 88, TIBC 295, iron saturation is 30%, ferritin 84.  Patient presents today with concerns for rectal pain and change in bowels. No longer on Linzess because it was causing diarrhea. She notes that she went from needed something for constipation to having increased stool frequency. Stool caliber much thinner than before. Passing stool 2-4 times daily. Large quantity of stool at a time but remains formed. Stools dark in color. She has noted pain on left side of rectum, shooting pain lasting for less than a minute at a time. Pain occurs without BM. Does not feel like hemorrhoid pain. She used some hemorrhoid cream without relief, once or twice. She notes rectal pain when on the treadmill or just sitting. She is up 9 pounds since 09/2021. Limits NSAIDS. No heartburn. No dysphagia.   Medications   Current Outpatient Medications  Medication Sig Dispense Refill   aspirin EC 81 MG tablet Take 1 tablet (81 mg total) by mouth daily. Swallow whole. 90 tablet 3   atorvastatin (LIPITOR) 20 MG tablet Take 20 mg by mouth daily.      CALCIUM-MAGNESIUM-ZINC PO Take by mouth.     cetirizine (ZYRTEC) 10 MG tablet Take 10-20 mg by mouth at bedtime.     Cholecalciferol (VITAMIN D3) 125 MCG (5000 UT) TABS Take 5,000 Units by mouth every other day.     metoprolol succinate (TOPROL XL) 25 MG 24 hr tablet Take 1 tablet (25 mg total) by mouth daily. 90 tablet 3   pantoprazole (PROTONIX) 40 MG tablet Take 1 tablet (40 mg total) by mouth daily. 90 tablet 3   zolpidem (AMBIEN) 5 MG tablet Take 5 mg by mouth at bedtime.     No current facility-administered medications for this visit.    Allergies   Allergies as of 02/24/2022   (No Known Allergies)     Past Medical History   Past Medical History:  Diagnosis Date   Anemia    Asthma    CAD (coronary artery disease) 0000000   Complication of anesthesia    "drop in blood pressure years ago after hysterectomy "    Depression    Dyspnea    Dysrhythmia    History of bleeding ulcers    Hypercholesteremia    Multiple thyroid nodules    Osteoarthritis of knees, bilateral 05/23/2019   Sarcoidosis    Seasonal allergies     Past Surgical History   Past Surgical History:  Procedure Laterality Date   ABDOMINAL HYSTERECTOMY     complete.    BIOPSY  10/02/2018   Procedure: BIOPSY;  Surgeon: Daneil Dolin, MD;  Location: [facility];  Service: Endoscopy;;  gastric   bleeding ulcer     CATARACT EXTRACTION W/PHACO Left 06/11/2019   Procedure: CATARACT EXTRACTION PHACO AND INTRAOCULAR LENS PLACEMENT (IOC) CDE: 4.63;  Surgeon: Baruch Goldmann, MD;  Location: [facility];  Service: Ophthalmology;  Laterality: Left;   CATARACT EXTRACTION W/PHACO Right 06/25/2019   Procedure: CATARACT EXTRACTION PHACO AND INTRAOCULAR LENS PLACEMENT RIGHT EYE;  Surgeon: Baruch Goldmann, MD;  Location: [facility];  Service: Ophthalmology;  Laterality: Right;  CDE: 5.61   CHOLECYSTECTOMY     COLONOSCOPY N/A 12/18/2015   Rourk: diverticulosis in sigmoid colon, otherwise normal    Cyst removed from ovary   1973   dental implants     ESOPHAGOGASTRODUODENOSCOPY (EGD) WITH PROPOFOL N/A 10/02/2018   Mild Schatzki ring status post dilation, small hiatal hernia, bleeding erosive gastropathy, gastritis on bx, no h pylori.  Procedure: ESOPHAGOGASTRODUODENOSCOPY (EGD) WITH PROPOFOL;  Surgeon: Daneil Dolin, MD;  Location: [facility];  Service: Endoscopy;  Laterality: N/A;  12:45pm   LAPAROSCOPIC CHOLECYSTECTOMY SINGLE SITE WITH INTRAOPERATIVE CHOLANGIOGRAM N/A 05/12/2016   Procedure: LAPAROSCOPIC CHOLECYSTECTOMY SINGLE SITE WITH INTRAOPERATIVE CHOLANGIOGRAM;  Surgeon: Michael Boston, MD;  Location: [facility];  Service: General;  Laterality: N/A;   left hammer toe repair     MALONEY DILATION N/A 10/02/2018   Procedure: Venia Minks DILATION;  Surgeon: Daneil Dolin, MD;  Location: [facility];  Service: Endoscopy;  Laterality: N/A;   RIGHT/LEFT HEART CATH AND CORONARY ANGIOGRAPHY N/A 09/18/2020   Procedure: RIGHT/LEFT HEART CATH AND CORONARY ANGIOGRAPHY;  Surgeon: Martinique, Peter M, MD;  Location: [facility];  Service: Cardiovascular;  Laterality: N/A;   TEE WITHOUT CARDIOVERSION N/A 09/18/2020   Procedure: TRANSESOPHAGEAL ECHOCARDIOGRAM (TEE);  Surgeon: Josue Hector, MD;  Location: [facility];  Service: Cardiovascular;  Laterality: N/A;   thyroid needle biopsy     VIDEO BRONCHOSCOPY Bilateral 03/21/2017   Did not have done    Past Family History   Family History  Problem Relation Age of Onset   Colon cancer Other    Breast cancer Mother    Tuberculosis Mother    Heart disease Father     Past Social History   Social History   Socioeconomic History   Marital status: Divorced    Spouse name: Not on file   Number of children: Not on file   Years of education: Not on file   Highest education level: Not on file  Occupational History   Not on file  Tobacco Use   Smoking status: Never    Passive exposure: Past   Smokeless tobacco: Never  Vaping Use   Vaping Use: Never used   Substance and Sexual Activity   Alcohol use: No   Drug use: No   Sexual activity: Not Currently    Birth control/protection: None  Other Topics Concern   Not on file  Social History Narrative   Moved from [facility] 2009. In 2008 when the economy dropped lost job. Reports that after lost job was hard to get another job.  Is not married and does not have children. Used to enjoy horses. Reports that moved here b/c it was a lot cheaper and warmer. Had visited here for a horse event in 1990 so decided to move here. Lives in [place], [place]. Reports that made stupid financial decisions and is dealing with that at this time.    Social Determinants of Health   Financial Resource Strain: Medium  Risk (08/29/2020)   Overall Financial Resource Strain (CARDIA)    Difficulty of Paying Living Expenses: Somewhat hard  Food Insecurity: No Food Insecurity (08/29/2020)   Hunger Vital Sign    Worried About Running Out of Food in the Last Year: Never true    Ran Out of Food in the Last Year: Never true  Transportation Needs: No Transportation Needs (08/29/2020)   PRAPARE - Hydrologist (Medical): No    Lack of Transportation (Non-Medical): No  Physical Activity: Not on file  Stress: Not on file  Social Connections: Not on file  Intimate Partner Violence: Not on file    Review of Systems   General: Negative for anorexia, weight loss, fever, chills, fatigue, weakness. ENT: Negative for hoarseness, difficulty swallowing , nasal congestion. CV: Negative for chest pain, angina, palpitations, dyspnea on exertion, peripheral edema.  Respiratory: Negative for dyspnea at rest, dyspnea on exertion, cough, sputum, wheezing.  GI: See history of present illness. GU:  Negative for dysuria, hematuria, urinary incontinence, urinary frequency, nocturnal urination.  Endo: Negative for unusual weight change.     Physical Exam   BP 114/73 (BP Location: Right Arm, Patient Position: Sitting, Cuff  Size: Normal)   Pulse 84   Temp 97.7 F (36.5 C) (Oral)   Ht 5' 3.5" (1.613 m)   Wt 150 lb 9.6 oz (68.3 kg)   SpO2 98%   BMI 26.26 kg/m    General: Well-nourished, well-developed in no acute distress.  Eyes: No icterus. Mouth: Oropharyngeal mucosa moist and pink   Lungs: Clear to auscultation bilaterally.  Heart: Regular rate and rhythm, no murmurs rubs or gallops.  Abdomen: Bowel sounds are normal, nontender, nondistended, no hepatosplenomegaly or masses,  no abdominal bruits or hernia , no rebound or guarding.  Rectal: mildly tender exam, ?small palpable hemorrhoid. No mass or fissure noted.  Extremities: No lower extremity edema. No clubbing or deformities. Neuro: Alert and oriented x 4   Skin: Warm and dry, no jaundice.   Psych: Alert and cooperative, normal mood and affect.  Labs   Lab Results  Component Value Date   CREATININE 0.90 01/23/2021   BUN 15 01/23/2021   NA 137 01/23/2021   K 4.2 01/23/2021   CL 106 01/23/2021   CO2 22 01/23/2021   Lab Results  Component Value Date   ALT 23 01/23/2021   AST 26 01/23/2021   ALKPHOS 73 01/23/2021   BILITOT 0.4 01/23/2021   Lab Results  Component Value Date   WBC 7.0 12/30/2021   HGB 14.7 12/30/2021   HCT 44.2 12/30/2021   MCV 92.5 12/30/2021   PLT 287 12/30/2021    Imaging Studies   CT CHEST HIGH RESOLUTION  Result Date: 02/03/2022 CLINICAL DATA:  Sarcoidosis EXAM: CT CHEST WITHOUT CONTRAST TECHNIQUE: Multidetector CT imaging of the chest was performed following the standard protocol without intravenous contrast. High resolution imaging of the lungs, as well as inspiratory and expiratory imaging, was performed. RADIATION DOSE REDUCTION: This exam was performed according to the departmental dose-optimization program which includes automated exposure control, adjustment of the mA and/or kV according to patient size and/or use of iterative reconstruction technique. COMPARISON:  07/16/2019 FINDINGS: Cardiovascular:  Aortic atherosclerosis. Normal heart size. Left and right coronary artery calcifications. No pericardial effusion. Mediastinum/Nodes: Multiple densely calcified, nonenlarged mediastinal and bilateral hilar lymph nodes. Unchanged heterogeneous nodule of the left lobe of the thyroid measuring 2.4 x 1.8 cm. Trachea and esophagus demonstrate no significant findings. Lungs/Pleura: Unchanged  appearance of clustered, confluent primarily perilymphatic nodularity and consolidation of the perihilar lungs, with extensive scarring and atelectasis of the right middle lobe. No significant air trapping on expiratory phase imaging. Unchanged spiculated nodule of the posterior right pulmonary apex measuring 0.7 cm, benign, for which no further follow-up or characterization is required (series 5, image 30). No pleural effusion or pneumothorax. Upper Abdomen: No acute abnormality.  Status post cholecystectomy. Musculoskeletal: No chest wall abnormality. No acute osseous findings. IMPRESSION: 1. Unchanged appearance of clustered, confluent primarily perilymphatic nodularity and consolidation of the perihilar lungs, with extensive scarring and atelectasis of the right middle lobe. 2. Multiple densely calcified, nonenlarged mediastinal and bilateral hilar lymph nodes. 3. Constellation of findings is consistent with pulmonary and nodal sarcoidosis. 4. Coronary artery disease. 5. Unchanged heterogeneous nodule of the left lobe of the thyroid measuring 2.4 x 1.8 cm. Recommend thyroid US (ref: J Am Coll Radiol. 2015 Feb;12(2): 143-50). Aortic Atherosclerosis (ICD10-I70.0). Electronically Signed   By: Delanna Ahmadi M.D.   On: 02/03/2022 11:28    Assessment   Change in bowels/rectal pain: patient notes increased stool frequency and small stool caliber, stark contrast to her constipation. Has been off Linzess for two months when she noted increased frequency and diarrhea. Now having solid stools but going 2-4 times per day. Notes change in  stool caliber. Notes short lived shooting rectal pain, possibly proctagia fugax. Last colonoscopy 2017, would offer updated colonoscopy given bowel changes.    PLAN   Colonoscopy in near future. ASA 3.  I have discussed the risks, alternatives, benefits with regards to but not limited to the risk of reaction to medication, bleeding, infection, perforation and the patient is agreeable to proceed. Written consent to be obtained.    Laureen Ochs. Bobby Rumpf, [place], [facility]

## 2022-02-24 NOTE — Telephone Encounter (Signed)
[facility] has an office [address] in [place]   I think [facility] has ortho in [place] and [place] I do not know which providers go there     I am sorry

## 2022-02-24 NOTE — Patient Instructions (Signed)
Colonoscopy to be scheduled. See separate instructions.   It was a pleasure to see you today. I want to create trusting relationships with patients and provide genuine, compassionate, and quality care. I truly value your feedback, so please be on the lookout for a survey regarding your visit with me today. I appreciate your time in completing this!

## 2022-02-25 ENCOUNTER — Encounter: Payer: Self-pay | Admitting: *Deleted

## 2022-02-25 NOTE — Telephone Encounter (Addendum)
Apologize for the delay.  Please let her know that CT scan shows stable changes in the lung consistent with prior sarcoidosis.  There are no new worrisome findings in the lung.  In addition there is calcification in the blood vessels of the heart and stable nodule in the thyroid.  It looks like her cardiologist and primary care are already aware of this.

## 2022-02-25 NOTE — Telephone Encounter (Signed)
Called and spoke to patient about her CT scan results. She verbalized understanding of the results. She has follow up next week with her cardiologist. Nothing further needed

## 2022-02-25 NOTE — Telephone Encounter (Signed)
Patient is returning call.

## 2022-02-25 NOTE — Telephone Encounter (Signed)
Left the pt a message to call the office back to endorse recommendations per Dr. Harrington Challenger, as indicated in this message.

## 2022-02-25 NOTE — Telephone Encounter (Signed)
Spoke with the pt about orthopedic locations for her to reach out to, as recommended in this message by Dr. Harrington Challenger.   Pt verbalized understanding and agrees with this plan.

## 2022-02-28 ENCOUNTER — Encounter: Payer: Self-pay | Admitting: Gastroenterology

## 2022-03-02 ENCOUNTER — Telehealth: Payer: Self-pay

## 2022-03-02 NOTE — Telephone Encounter (Signed)
Pt called stating that her bowels have become erratic since seeing you. Pt states that her bowels have stopped moving. Pt is wanting to know if she should resume her linzess before the colonoscopy. Pt also states that the pain is now constant. Please advise.

## 2022-03-02 NOTE — Telephone Encounter (Signed)
Yes she can resume Linzess. Tammy, where is her pain? Rectum or abdominal pain.

## 2022-03-03 ENCOUNTER — Other Ambulatory Visit: Payer: Self-pay | Admitting: *Deleted

## 2022-03-03 MED ORDER — PEG 3350-KCL-NA BICARB-NACL 420 G PO SOLR: 4000.0000 mL | Freq: Once | ORAL | 0 refills | Status: AC

## 2022-03-03 MED ORDER — HYDROCORTISONE (PERIANAL) 2.5 % EX CREA: 1.0000 | TOPICAL_CREAM | Freq: Two times a day (BID) | CUTANEOUS | 1 refills | Status: DC

## 2022-03-03 NOTE — Telephone Encounter (Signed)
Pt was made aware and verbalized understanding.  

## 2022-03-03 NOTE — Addendum Note (Signed)
Addended by: Mahala Menghini on: 03/03/2022 12:08 PM   Modules accepted: Orders

## 2022-03-03 NOTE — Telephone Encounter (Signed)
Let pt know to resume Linzess. Let's try to have her use anusol cream twice daily for 10 days (RX sent in case she needs it), use sitz baths. Call if symptoms not improving.   Colonoscopy as scheduled.

## 2022-03-04 ENCOUNTER — Encounter: Payer: Self-pay | Admitting: Gastroenterology

## 2022-03-08 ENCOUNTER — Ambulatory Visit: Payer: Medicare HMO | Admitting: Gastroenterology

## 2022-03-15 ENCOUNTER — Encounter (HOSPITAL_COMMUNITY)
Admission: RE | Admit: 2022-03-15 | Discharge: 2022-03-15 | Disposition: A | Discharge status: Home / Self Care | Payer: Medicare HMO | Source: Ambulatory Visit | Attending: Internal Medicine | Admitting: Internal Medicine

## 2022-03-15 ENCOUNTER — Encounter (HOSPITAL_COMMUNITY): Payer: Self-pay

## 2022-03-15 HISTORY — DX: Nonrheumatic mitral (valve) prolapse: I34.1

## 2022-03-15 NOTE — Patient Instructions (Signed)
Amber Bird  03/15/2022     @[email or web address]$ @   Your procedure is scheduled on 03/17/2022.  Report to [facility] at 7:00 A.M.  Call this number if you have problems the morning of surgery:  [phone]  If you experience any cold or flu symptoms such as cough, fever, chills, shortness of breath, etc. between now and your scheduled surgery, please notify us at the above number.   Remember:   Please Follow the Diet and Prep instructions given to you by Dr Roseanne Kaufman office.    Take these medicines the morning of surgery with A SIP OF WATER : Zyrtec Metoprolol Pantoprazole    Do not wear jewelry, make-up or nail polish.  Do not wear lotions, powders, or perfumes, or deodorant.  Do not shave 48 hours prior to surgery.  Men may shave face and neck.  Do not bring valuables to the hospital.  [facility] is not responsible for any belongings or valuables.  Contacts, dentures or bridgework may not be worn into surgery.  Leave your suitcase in the car.  After surgery it may be brought to your room.  For patients admitted to the hospital, discharge time will be determined by your treatment team.  Patients discharged the day of surgery will not be allowed to drive home.   Name and phone number of your driver:   Family Special instructions:  N/A  Please read over the following fact sheets that you were given. Care and Recovery After Surgery  Colonoscopy, Adult A colonoscopy is a procedure to look at the entire large intestine. This procedure is done using a long, thin, flexible tube that has a camera on the end. You may have a colonoscopy: As a part of normal colorectal screening. If you have certain symptoms, such as: A low number of red blood cells in your blood (anemia). Diarrhea that does not go away. Pain in your abdomen. Blood in your stool. A colonoscopy can help screen for and diagnose medical problems, including: An abnormal growth of cells or tissue  (tumor). Abnormal growths within the lining of your intestine (polyps). Inflammation. Areas of bleeding. Tell your health care provider about: Any allergies you have. All medicines you are taking, including vitamins, herbs, eye drops, creams, and over-the-counter medicines. Any problems you or family members have had with anesthetic medicines. Any bleeding problems you have. Any surgeries you have had. Any medical conditions you have. Any problems you have had with having bowel movements. Whether you are pregnant or may be pregnant. What are the risks? Generally, this is a safe procedure. However, problems may occur, including: Bleeding. Damage to your intestine. Allergic reactions to medicines given during the procedure. Infection. This is rare. What happens before the procedure? Eating and drinking restrictions Follow instructions from your health care provider about eating or drinking restrictions, which may include: A few days before the procedure: Follow a low-fiber diet. Avoid nuts, seeds, dried fruit, raw fruits, and vegetables. 1-3 days before the procedure: Eat only gelatin dessert or ice pops. Drink only clear liquids, such as water, clear juice, clear broth or bouillon, black coffee or tea, or clear soft drinks or sports drinks. Avoid liquids that contain red or purple dye. The day of the procedure: Do not eat solid foods. You may continue to drink clear liquids until up to 2 hours before the procedure. Do not eat or drink anything starting 2 hours before the procedure, or within the time period that your health care provider  recommends. Bowel prep If you were prescribed a bowel prep to take by mouth (orally) to clean out your colon: Take it as told by your health care provider. Starting the day before your procedure, you will need to drink a large amount of liquid medicine. The liquid will cause you to have many bowel movements of loose stool until your stool becomes  almost clear or light green. If your skin or the opening between the buttocks (anus) gets irritated from diarrhea, you may relieve the irritation using: Wipes with medicine in them, such as adult wet wipes with aloe and vitamin E. A product to soothe skin, such as petroleum jelly. If you vomit while drinking the bowel prep: Take a break for up to 60 minutes. Begin the bowel prep again. Call your health care provider if you keep vomiting or you cannot take the bowel prep without vomiting. To clean out your colon, you may also be given: Laxative medicines. These help you have a bowel movement. Instructions for enema use. An enema is liquid medicine injected into your rectum. Medicines Ask your health care provider about: Changing or stopping your regular medicines or supplements. This is especially important if you are taking iron supplements, diabetes medicines, or blood thinners. Taking medicines such as aspirin and ibuprofen. These medicines can thin your blood. Do not take these medicines unless your health care provider tells you to take them. Taking over-the-counter medicines, vitamins, herbs, and supplements. General instructions Ask your health care provider what steps will be taken to help prevent infection. These may include washing skin with a germ-killing soap. If you will be going home right after the procedure, plan to have a responsible adult: Take you home from the hospital or clinic. You will not be allowed to drive. Care for you for the time you are told. What happens during the procedure?  An IV will be inserted into one of your veins. You will be given a medicine to make you fall asleep (general anesthetic). You will lie on your side with your knees bent. A lubricant will be put on the tube. Then the tube will be: Inserted into your anus. Gently eased through all parts of your large intestine. Air will be sent into your colon to keep it open. This may cause some  pressure or cramping. Images will be taken with the camera and will appear on a screen. A small tissue sample may be removed to be looked at under a microscope (biopsy). The tissue may be sent to a lab for testing if any signs of problems are found. If small polyps are found, they may be removed and checked for cancer cells. When the procedure is finished, the tube will be removed. The procedure may vary among health care providers and hospitals. What happens after the procedure? Your blood pressure, heart rate, breathing rate, and blood oxygen level will be monitored until you leave the hospital or clinic. You may have a small amount of blood in your stool. You may pass gas and have mild cramping or bloating in your abdomen. This is caused by the air that was used to open your colon during the exam. If you were given a sedative during the procedure, it can affect you for several hours. Do not drive or operate machinery until your health care provider says that it is safe. It is up to you to get the results of your procedure. Ask your health care provider, or the department that is doing the procedure,  when your results will be ready. Summary A colonoscopy is a procedure to look at the entire large intestine. Follow instructions from your health care provider about eating and drinking before the procedure. If you were prescribed an oral bowel prep to clean out your colon, take it as told by your health care provider. During the colonoscopy, a flexible tube with a camera on its end is inserted into the anus and then passed into all parts of the large intestine. This information is not intended to replace advice given to you by your health care provider. Make sure you discuss any questions you have with your health care provider. Document Revised: 01/12/2021 Document Reviewed: 09/10/2020 Elsevier Patient Education  [place] Anesthesia refers to the  techniques, procedures, and medicines that help a person stay safe and comfortable during surgery. Monitored anesthesia care, or sedation, is one type of anesthesia. You may have sedation if you do not need to be asleep for your procedure. Procedures that use sedation may include: Surgery to remove cataracts from your eyes. A dental procedure. A biopsy. This is when a tissue sample is removed and looked at under a microscope. You will be watched closely during your procedure. Your level of sedation or type of anesthesia may be changed to fit your needs. Tell a health care provider about: Any allergies you have. All medicines you are taking, including vitamins, herbs, eye drops, creams, and over-the-counter medicines. Any problems you or family members have had with anesthesia. Any bleeding problems you have. Any surgeries you have had. Any medical conditions or illnesses you have. This includes sleep apnea, cough, fever, or the flu. Whether you are pregnant or may be pregnant. Whether you use cigarettes, alcohol, or drugs. Any use of steroids, whether by mouth or as a cream. What are the risks? Your health care provider will talk with you about risks. These may include: Getting too much medicine (oversedation). Nausea. Allergic reactions to medicines. Trouble breathing. If this happens, a breathing tube may be used to help you breathe. It will be removed when you are awake and breathing on your own. Heart trouble. Lung trouble. Confusion that gets better with time (emergence delirium). What happens before the procedure? When to stop eating and drinking Follow instructions from your health care provider about what you may eat and drink. These may include: 8 hours before your procedure Stop eating most foods. Do not eat meat, fried foods, or fatty foods. Eat only light foods, such as toast or crackers. All liquids are okay except energy drinks and alcohol. 6 hours before your  procedure Stop eating. Drink only clear liquids, such as water, clear fruit juice, black coffee, plain tea, and sports drinks. Do not drink energy drinks or alcohol. 2 hours before your procedure Stop drinking all liquids. You may be allowed to take medicines with small sips of water. If you do not follow your health care provider's instructions, your procedure may be delayed or canceled. Medicines Ask your health care provider about: Changing or stopping your regular medicines. These include any diabetes medicines or blood thinners you take. Taking medicines such as aspirin and ibuprofen. These medicines can thin your blood. Do not take them unless your health care provider tells you to. Taking over-the-counter medicines, vitamins, herbs, and supplements. Testing You may have an exam or testing. You may have a blood or urine sample taken. General instructions Do not use any products that contain nicotine or tobacco for at  least 4 weeks before the procedure. These products include cigarettes, chewing tobacco, and vaping devices, such as e-cigarettes. If you need help quitting, ask your health care provider. If you will be going home right after the procedure, plan to have a responsible adult: Take you home from the hospital or clinic. You will not be allowed to drive. Care for you for the time you are told. What happens during the procedure?  Your blood pressure, heart rate, breathing, level of pain, and blood oxygen level will be monitored. An IV will be inserted into one of your veins. You may be given: A sedative. This helps you relax. Anesthesia. This will: Numb certain areas of your body. Make you fall asleep for surgery. You will be given medicines as needed to keep you comfortable. The more medicine you are given, the deeper your level of sedation will be. Your level of sedation may be changed to fit your needs. There are three levels of sedation: Mild sedation. At this level,  you may feel awake and relaxed. You will be able to follow directions. Moderate sedation. At this level, you will be sleepy. You may not remember the procedure. Deep sedation. At this level, you will be asleep. You will not remember the procedure. How you get the medicines will depend on your age and the procedure. They may be given as: A pill. This may be taken by mouth (orally) or inserted into the rectum. An injection. This may be into a vein or muscle. A spray through the nose. After your procedure is over, the medicine will be stopped. The procedure may vary among health care providers and hospitals. What happens after the procedure? Your blood pressure, heart rate, breathing rate, and blood oxygen level will be monitored until you leave the hospital or clinic. You may feel sleepy, clumsy, or nauseous. You may not remember what happened during or after the procedure. Sedation can affect you for several hours. Do not drive or use machinery until your health care provider says that it is safe. This information is not intended to replace advice given to you by your health care provider. Make sure you discuss any questions you have with your health care provider. Document Revised: 06/15/2021 Document Reviewed: 06/15/2021 Elsevier Patient Education  [place].

## 2022-03-17 ENCOUNTER — Ambulatory Visit (HOSPITAL_COMMUNITY): Payer: Medicare HMO | Admitting: Anesthesiology

## 2022-03-17 ENCOUNTER — Ambulatory Visit (HOSPITAL_BASED_OUTPATIENT_CLINIC_OR_DEPARTMENT_OTHER): Payer: Medicare HMO | Admitting: Anesthesiology

## 2022-03-17 ENCOUNTER — Ambulatory Visit (HOSPITAL_COMMUNITY)
Admission: RE | Admit: 2022-03-17 | Discharge: 2022-03-17 | Disposition: A | Discharge status: Home / Self Care | Payer: Medicare HMO | Attending: Internal Medicine | Admitting: Internal Medicine

## 2022-03-17 ENCOUNTER — Encounter (HOSPITAL_COMMUNITY): Payer: Self-pay | Admitting: Internal Medicine

## 2022-03-17 ENCOUNTER — Telehealth: Payer: Self-pay

## 2022-03-17 ENCOUNTER — Encounter (HOSPITAL_COMMUNITY)
Admission: RE | Disposition: A | Discharge status: Home / Self Care | Payer: Self-pay | Source: Home / Self Care | Attending: Internal Medicine

## 2022-03-17 DIAGNOSIS — R194 Change in bowel habit: Secondary | ICD-10-CM | POA: Diagnosis not present

## 2022-03-17 DIAGNOSIS — K602 Anal fissure, unspecified: Secondary | ICD-10-CM

## 2022-03-17 DIAGNOSIS — K6289 Other specified diseases of anus and rectum: Secondary | ICD-10-CM | POA: Diagnosis not present

## 2022-03-17 DIAGNOSIS — D759 Disease of blood and blood-forming organs, unspecified: Secondary | ICD-10-CM | POA: Insufficient documentation

## 2022-03-17 DIAGNOSIS — K219 Gastro-esophageal reflux disease without esophagitis: Secondary | ICD-10-CM | POA: Diagnosis not present

## 2022-03-17 DIAGNOSIS — I2510 Atherosclerotic heart disease of native coronary artery without angina pectoris: Secondary | ICD-10-CM | POA: Diagnosis not present

## 2022-03-17 DIAGNOSIS — D649 Anemia, unspecified: Secondary | ICD-10-CM | POA: Diagnosis not present

## 2022-03-17 HISTORY — PX: COLONOSCOPY WITH PROPOFOL: SHX5780

## 2022-03-17 SURGERY — COLONOSCOPY WITH PROPOFOL
Anesthesia: General

## 2022-03-17 MED ORDER — PHENYLEPHRINE 80 MCG/ML (10ML) SYRINGE FOR IV PUSH (FOR BLOOD PRESSURE SUPPORT)
PREFILLED_SYRINGE | INTRAVENOUS | Status: DC | PRN
  Administered 2022-03-17 (×5): 160 ug via INTRAVENOUS

## 2022-03-17 MED ORDER — PHENYLEPHRINE 80 MCG/ML (10ML) SYRINGE FOR IV PUSH (FOR BLOOD PRESSURE SUPPORT)
PREFILLED_SYRINGE | INTRAVENOUS | Status: AC
  Filled 2022-03-17: qty 10

## 2022-03-17 MED ORDER — LIDOCAINE HCL (CARDIAC) PF 100 MG/5ML IV SOSY
PREFILLED_SYRINGE | INTRAVENOUS | Status: DC | PRN
  Administered 2022-03-17: 50 mg via INTRAVENOUS

## 2022-03-17 MED ORDER — PROPOFOL 500 MG/50ML IV EMUL
INTRAVENOUS | Status: DC | PRN
  Administered 2022-03-17: 100 ug/kg/min via INTRAVENOUS

## 2022-03-17 MED ORDER — LACTATED RINGERS IV SOLN: INTRAVENOUS | Status: DC | PRN

## 2022-03-17 MED ORDER — PROPOFOL 10 MG/ML IV BOLUS
INTRAVENOUS | Status: DC | PRN
  Administered 2022-03-17: 100 mg via INTRAVENOUS

## 2022-03-17 NOTE — Interval H&P Note (Signed)
History and Physical Interval Note:  03/17/2022 9:10 AM  Amber Bird  has presented today for surgery, with the diagnosis of change in bowel habits,rectal pain.  The various methods of treatment have been discussed with the patient and family. After consideration of risks, benefits and other options for treatment, the patient has consented to  Procedure(s) with comments: COLONOSCOPY WITH PROPOFOL (N/A) - 9:00 am as a surgical intervention.  The patient's history has been reviewed, patient examined, no change in status, stable for surgery.  I have reviewed the patient's chart and labs.  Questions were answered to the patient's satisfaction.     Amber Bird  No change.  Change in bowel habits and anal pain.  Here for diagnostic colonoscopy per plan.   Colonoscopy Discharge Instructions  Read the instructions outlined below and refer to this sheet in the next few weeks. These discharge instructions provide you with general information on caring for yourself after you leave the hospital. Your doctor may also give you specific instructions. While your treatment has been planned according to the most current medical practices available, unavoidable complications occasionally occur. If you have any problems or questions after discharge, call Dr. Gala Romney at [phone]. ACTIVITY You may resume your regular activity, but move at a slower pace for the next 24 hours.  Take frequent rest periods for the next 24 hours.  Walking will help get rid of the air and reduce the bloated feeling in your belly (abdomen).  No driving for 24 hours (because of the medicine (anesthesia) used during the test).   Do not sign any important legal documents or operate any machinery for 24 hours (because of the anesthesia used during the test).  NUTRITION Drink plenty of fluids.  You may resume your normal diet as instructed by your doctor.  Begin with a light meal and progress to your normal diet. Heavy or fried foods are  harder to digest and may make you feel sick to your stomach (nauseated).  Avoid alcoholic beverages for 24 hours or as instructed.  MEDICATIONS You may resume your normal medications unless your doctor tells you otherwise.  WHAT YOU CAN EXPECT TODAY Some feelings of bloating in the abdomen.  Passage of more gas than usual.  Spotting of blood in your stool or on the toilet paper.  IF YOU HAD POLYPS REMOVED DURING THE COLONOSCOPY: No aspirin products for 7 days or as instructed.  No alcohol for 7 days or as instructed.  Eat a soft diet for the next 24 hours.  FINDING OUT THE RESULTS OF YOUR TEST Not all test results are available during your visit. If your test results are not back during the visit, make an appointment with your caregiver to find out the results. Do not assume everything is normal if you have not heard from your caregiver or the medical facility. It is important for you to follow up on all of your test results.  SEEK IMMEDIATE MEDICAL ATTENTION IF: You have more than a spotting of blood in your stool.  Your belly is swollen (abdominal distention).  You are nauseated or vomiting.  You have a temperature over 101.  You have abdominal pain or discomfort that is severe or gets worse throughout the day.

## 2022-03-17 NOTE — Telephone Encounter (Signed)
-----   Message from Daneil Dolin, MD sent at 03/17/2022  9:38 AM EST ----- New prescription: Apothecary hemorrhoid cream add lidocaine and Cardizem.  Pea-sized amount in the anorectum 4 times daily as needed.  Dispense 30 g 1 refill

## 2022-03-17 NOTE — Interval H&P Note (Signed)
History and Physical Interval Note:  03/17/2022 9:03 AM  Amber Bird  has presented today for surgery, with the diagnosis of change in bowel habits,rectal pain.  The various methods of treatment have been discussed with the patient and family. After consideration of risks, benefits and other options for treatment, the patient has consented to  Procedure(s) with comments: COLONOSCOPY WITH PROPOFOL (N/A) - 9:00 am as a surgical intervention.  The patient's history has been reviewed, patient examined, no change in status, stable for surgery.  I have reviewed the patient's chart and labs.  Questions were answered to the patient's satisfaction.     Amber Bird   No change.  Change in bowel habits and rectal pain.  I will offer the patient a diagnostic colonoscopy.The risks, benefits, limitations, alternatives and imponderables have been reviewed with the patient. Questions have been answered. All parties are agreeable.

## 2022-03-17 NOTE — Telephone Encounter (Signed)
Rx was called in to [place].

## 2022-03-17 NOTE — Discharge Instructions (Addendum)
  Colonoscopy Discharge Instructions  Read the instructions outlined below and refer to this sheet in the next few weeks. These discharge instructions provide you with general information on caring for yourself after you leave the hospital. Your doctor may also give you specific instructions. While your treatment has been planned according to the most current medical practices available, unavoidable complications occasionally occur. If you have any problems or questions after discharge, call Dr. Gala Romney at [phone]. ACTIVITY You may resume your regular activity, but move at a slower pace for the next 24 hours.  Take frequent rest periods for the next 24 hours.  Walking will help get rid of the air and reduce the bloated feeling in your belly (abdomen).  No driving for 24 hours (because of the medicine (anesthesia) used during the test).   Do not sign any important legal documents or operate any machinery for 24 hours (because of the anesthesia used during the test).  NUTRITION Drink plenty of fluids.  You may resume your normal diet as instructed by your doctor.  Begin with a light meal and progress to your normal diet. Heavy or fried foods are harder to digest and may make you feel sick to your stomach (nauseated).  Avoid alcoholic beverages for 24 hours or as instructed.  MEDICATIONS You may resume your normal medications unless your doctor tells you otherwise.  WHAT YOU CAN EXPECT TODAY Some feelings of bloating in the abdomen.  Passage of more gas than usual.  Spotting of blood in your stool or on the toilet paper.  IF YOU HAD POLYPS REMOVED DURING THE COLONOSCOPY: No aspirin products for 7 days or as instructed.  No alcohol for 7 days or as instructed.  Eat a soft diet for the next 24 hours.  FINDING OUT THE RESULTS OF YOUR TEST Not all test results are available during your visit. If your test results are not back during the visit, make an appointment with your caregiver to find out the  results. Do not assume everything is normal if you have not heard from your caregiver or the medical facility. It is important for you to follow up on all of your test results.  SEEK IMMEDIATE MEDICAL ATTENTION IF: You have more than a spotting of blood in your stool.  Your belly is swollen (abdominal distention).  You are nauseated or vomiting.  You have a temperature over 101.  You have abdominal pain or discomfort that is severe or gets worse throughout the day.      no polyps found today.  You have an anal fissure likely the cause of your pain  Begin [facility] ap[facility]ary cream 1 pea-sized amount into the anorectum 4 times a day.  New prescription sent to [facility].  You must get this from [place]   This is   a special preparation they make up for  my patients.  Begin Benefiber 1 tablespoon daily for 3 weeks; thereafter increase to 2 2 tablespoons daily  A future colonoscopy is not recommended unless new symptoms develop need   information on anal fissure provided

## 2022-03-17 NOTE — Op Note (Signed)
[facility] Patient Name: Amber Bird Procedure Date: 03/17/2022 8:53 AM MRN: [id number] Date of Birth: [birth year] Attending MD: Norvel Richards , MD, [id number] CSN: [id number] Age: 77 Admit Type: Outpatient Procedure:                Colonoscopy Indications:              Change in bowel habits Providers:                Norvel Richards, MD, Charlsie Quest. Theda Sers RN, RN,                            Aram Candela Referring MD:              Medicines:                Propofol per Anesthesia Complications:            No immediate complications. Estimated Blood Loss:     Estimated blood loss: none. Procedure:                Pre-Anesthesia Assessment:                           - Prior to the procedure, a History and Physical                            was performed, and patient medications and                            allergies were reviewed. The patient's tolerance of                            previous anesthesia was also reviewed. The risks                            and benefits of the procedure and the sedation                            options and risks were discussed with the patient.                            All questions were answered, and informed consent                            was obtained. ASA Grade Assessment: III - A patient                            with severe systemic disease. After reviewing the                            risks and benefits, the patient was deemed in                            satisfactory condition to undergo the procedure.  After obtaining informed consent, the colonoscope                            was passed under direct vision. Throughout the                            procedure, the patient's blood pressure, pulse, and                            oxygen saturations were monitored continuously. The                            [id number]) scope was introduced through the                             anus and advanced to the the cecum, identified by                            appendiceal orifice and ileocecal valve. The                            colonoscopy was performed without difficulty. The                            patient tolerated the procedure well. The quality                            of the bowel preparation was adequate. The                            ileocecal valve, appendiceal orifice, and rectum                            were photographed. The entire colon was examined. Scope In: 9:17:04 AM Scope Out: 9:36:09 AM Scope Withdrawal Time: 0 hours 6 minutes 24 seconds  Total Procedure Duration: 0 hours 19 minutes 5 seconds  Findings:      The perianal and digital rectal examinations were normal.      Endoscopic findings there appeared to be a fissure at the 3 o'clock       position. Please see photos.      The colon (entire examined portion) appeared normal. Granular residual       stool and seeds had to be washed and suctioned out to gain adequate       visualization of the colon mucosa.      The retroflexed view of the distal rectum and anal verge was normal and       showed no anal or rectal abnormalities. Impression:               - The entire examined colon is normal. Anal                            fissure. No significant hemorrhoids seen.                           -  The distal rectum and anal verge are normal on                            retroflexion view.                           - No specimens collected. Moderate Sedation:      Moderate (conscious) sedation was personally administered by an       anesthesia professional. The following parameters were monitored: oxygen       saturation, heart rate, blood pressure, and response to care. Recommendation:           - Patient has a contact number available for                            emergencies. The signs and symptoms of potential                            delayed complications were discussed with the                             patient. Return to normal activities tomorrow.                            Written discharge instructions were provided to the                            patient.                           - Advance diet as tolerated.                           - No repeat colonoscopy due to the absence of                            advanced adenomas. Benefiber daily. [place]                            apothe[facility]ary hemorrhoid cream and lidocaine and                            Cardizem.                           - Return to GI clinic in 2 months. Procedure Code(s):        --- Professional ---                           [phone], Colonoscopy, flexible; diagnostic, including                            collection of specimen(s) by brushing or washing,                            when performed (separate procedure) Diagnosis Code(s):        ---  Professional ---                           R19.4, Change in bowel habit CPT copyright 2022 American Medical Association. All rights reserved. The codes documented in this report are preliminary and upon coder review may  be revised to meet current compliance requirements. Cristopher Estimable. Roderick Calo, MD Norvel Richards, MD 03/17/2022 10:33:48 AM This report has been signed electronically. Number of Addenda: 0

## 2022-03-17 NOTE — Anesthesia Preprocedure Evaluation (Signed)
Anesthesia Evaluation  Patient identified by MRN, date of birth, ID band Patient awake    Reviewed: Allergy & Precautions, H&P , NPO status , Patient's Chart, lab work & pertinent test results, reviewed documented beta blocker date and time   History of Anesthesia Complications (+) history of anesthetic complications  Airway Mallampati: II  TM Distance: >3 FB Neck ROM: full    Dental no notable dental hx.    Pulmonary neg pulmonary ROS, shortness of breath, asthma    Pulmonary exam normal breath sounds clear to auscultation       Cardiovascular Exercise Tolerance: Good + CAD  negative cardio ROS + dysrhythmias  Rhythm:regular Rate:Normal     Neuro/Psych  PSYCHIATRIC DISORDERS  Depression    negative neurological ROS  negative psych ROS   GI/Hepatic negative GI ROS, Neg liver ROS,GERD  ,,  Endo/Other  negative endocrine ROS    Renal/GU negative Renal ROS  negative genitourinary   Musculoskeletal   Abdominal   Peds  Hematology negative hematology ROS (+) Blood dyscrasia, anemia   Anesthesia Other Findings   Reproductive/Obstetrics negative OB ROS                             Anesthesia Physical Anesthesia Plan  ASA: 2  Anesthesia Plan: General   Post-op Pain Management:    Induction:   PONV Risk Score and Plan: Propofol infusion  Airway Management Planned:   Additional Equipment:   Intra-op Plan:   Post-operative Plan:   Informed Consent: I have reviewed the patients History and Physical, chart, labs and discussed the procedure including the risks, benefits and alternatives for the proposed anesthesia with the patient or authorized representative who has indicated his/her understanding and acceptance.     Dental Advisory Given  Plan Discussed with: CRNA  Anesthesia Plan Comments:        Anesthesia Quick Evaluation

## 2022-03-17 NOTE — Transfer of Care (Signed)
Immediate Anesthesia Transfer of Care Note  Patient: Amber Bird  Procedure(s) Performed: COLONOSCOPY WITH PROPOFOL  Patient Location: Short Stay  Anesthesia Type:General  Level of Consciousness: drowsy  Airway & Oxygen Therapy: Patient Spontanous Breathing and Patient connected to nasal cannula oxygen  Post-op Assessment: Report given to RN and Post -op Vital signs reviewed and stable  Post vital signs: Reviewed and stable  Last Vitals:  Vitals Value Taken Time  BP 81/49 03/17/22 0939  Temp    Pulse 57 03/17/22 0939  Resp 16 03/17/22 0939  SpO2 99 % 03/17/22 0939    Last Pain:  Vitals:   03/17/22 0939  TempSrc:   PainSc: Asleep         Complications: No notable events documented.

## 2022-03-18 ENCOUNTER — Telehealth: Payer: Self-pay

## 2022-03-18 NOTE — Telephone Encounter (Signed)
Pt called stating that the [place] [facility] cream was too expensive. Pt states that currently her pain is better but that she is unable to pay for the cream.

## 2022-03-19 NOTE — Anesthesia Postprocedure Evaluation (Signed)
Anesthesia Post Note  Patient: Amber Bird  Procedure(s) Performed: COLONOSCOPY WITH PROPOFOL  Patient location during evaluation: Phase II Anesthesia Type: General Level of consciousness: awake Pain management: pain level controlled Vital Signs Assessment: post-procedure vital signs reviewed and stable Respiratory status: spontaneous breathing and respiratory function stable Cardiovascular status: blood pressure returned to baseline and stable Postop Assessment: no headache and no apparent nausea or vomiting Anesthetic complications: no Comments: Late entry   No notable events documented.   Last Vitals:  Vitals:   03/17/22 0946 03/17/22 0950  BP: (!) 104/59 90/64  Pulse:    Resp:    Temp:    SpO2:      Last Pain:  Vitals:   03/17/22 0939  TempSrc:   PainSc: [place]

## 2022-03-22 ENCOUNTER — Ambulatory Visit: Payer: Medicare HMO | Admitting: Family Medicine

## 2022-03-23 ENCOUNTER — Encounter (HOSPITAL_COMMUNITY): Payer: Self-pay | Admitting: Internal Medicine

## 2022-03-23 NOTE — Addendum Note (Signed)
Addendum  created 03/23/22 1202 by Orlie Dakin, CRNA   Intraprocedure Staff edited

## 2022-04-06 ENCOUNTER — Telehealth: Payer: Self-pay

## 2022-04-06 MED ORDER — LINACLOTIDE 72 MCG PO CAPS: 72.0000 ug | ORAL_CAPSULE | Freq: Every day | ORAL | 3 refills | Status: DC

## 2022-04-06 NOTE — Addendum Note (Signed)
Addended by: Mahala Menghini on: 04/06/2022 01:32 PM   Modules accepted: Orders

## 2022-04-06 NOTE — Telephone Encounter (Signed)
Pt called requesting refills on Linzess. Pt was last seen on 02/24/2022. Pt would like that to be sent to [facility] in [place].

## 2022-04-06 NOTE — Telephone Encounter (Signed)
Rx sent 

## 2022-04-06 NOTE — Telephone Encounter (Signed)
Pt was made aware and verbalized understanding.  

## 2022-04-21 ENCOUNTER — Telehealth: Payer: Self-pay | Admitting: Pulmonary Disease

## 2022-04-21 NOTE — Telephone Encounter (Signed)
Doing recall for pt & pt states she does not care to drive to [place], states she doesnt see the point of the appt also states she is getting a new PCP and if she needs a pulmonologist she will give [place] a call.

## 2022-04-27 DIAGNOSIS — K219 Gastro-esophageal reflux disease without esophagitis: Secondary | ICD-10-CM | POA: Diagnosis not present

## 2022-04-27 DIAGNOSIS — Z952 Presence of prosthetic heart valve: Secondary | ICD-10-CM | POA: Diagnosis not present

## 2022-04-27 DIAGNOSIS — E7801 Familial hypercholesterolemia: Secondary | ICD-10-CM | POA: Diagnosis not present

## 2022-04-27 DIAGNOSIS — M810 Age-related osteoporosis without current pathological fracture: Secondary | ICD-10-CM | POA: Diagnosis not present

## 2022-04-27 DIAGNOSIS — G4700 Insomnia, unspecified: Secondary | ICD-10-CM | POA: Diagnosis not present

## 2022-04-27 DIAGNOSIS — Z862 Personal history of diseases of the blood and blood-forming organs and certain disorders involving the immune mechanism: Secondary | ICD-10-CM | POA: Diagnosis not present

## 2022-04-27 DIAGNOSIS — Z6825 Body mass index (BMI) 25.0-25.9, adult: Secondary | ICD-10-CM | POA: Diagnosis not present

## 2022-04-27 DIAGNOSIS — K5900 Constipation, unspecified: Secondary | ICD-10-CM | POA: Diagnosis not present

## 2022-04-28 DIAGNOSIS — M810 Age-related osteoporosis without current pathological fracture: Secondary | ICD-10-CM | POA: Insufficient documentation

## 2022-04-29 ENCOUNTER — Telehealth: Payer: Self-pay | Admitting: Pulmonary Disease

## 2022-04-29 NOTE — Telephone Encounter (Signed)
I called PT to sched FU and PFT. She states last time she had a nurse call her with her CT results there was a lot of back ground noise but she thought she heard that things were the same from one CT to the next.  She has looked up her diagnosis on line also and saw that there is really nothing that can be done for her condition "...so what is the point?"  She has a new PCP and is working with him closely regarding her health concerns..  Another reason for concern is transportation issues with her older car. She was concerned she may get in an accident or have car trouble. I let her know we have a [place] location.   Also, complained that it took so long for Dr. To get back to her but added that he was very nice and warm and friendly.   I told her I would pass on this information.

## 2022-04-30 NOTE — Telephone Encounter (Signed)
Dr. Vaughan Browner just an Juluis Rainier See previous message

## 2022-05-04 DIAGNOSIS — M810 Age-related osteoporosis without current pathological fracture: Secondary | ICD-10-CM | POA: Diagnosis not present

## 2022-05-04 DIAGNOSIS — K219 Gastro-esophageal reflux disease without esophagitis: Secondary | ICD-10-CM | POA: Diagnosis not present

## 2022-05-05 DIAGNOSIS — E7801 Familial hypercholesterolemia: Secondary | ICD-10-CM | POA: Diagnosis not present

## 2022-05-05 DIAGNOSIS — M810 Age-related osteoporosis without current pathological fracture: Secondary | ICD-10-CM | POA: Diagnosis not present

## 2022-05-05 DIAGNOSIS — K219 Gastro-esophageal reflux disease without esophagitis: Secondary | ICD-10-CM | POA: Diagnosis not present

## 2022-05-05 DIAGNOSIS — Z0001 Encounter for general adult medical examination with abnormal findings: Secondary | ICD-10-CM | POA: Diagnosis not present

## 2022-05-05 DIAGNOSIS — E559 Vitamin D deficiency, unspecified: Secondary | ICD-10-CM | POA: Diagnosis not present

## 2022-05-05 DIAGNOSIS — Z1329 Encounter for screening for other suspected endocrine disorder: Secondary | ICD-10-CM | POA: Diagnosis not present

## 2022-05-13 DIAGNOSIS — M810 Age-related osteoporosis without current pathological fracture: Secondary | ICD-10-CM | POA: Diagnosis not present

## 2022-05-13 DIAGNOSIS — Z6825 Body mass index (BMI) 25.0-25.9, adult: Secondary | ICD-10-CM | POA: Diagnosis not present

## 2022-05-13 DIAGNOSIS — Z952 Presence of prosthetic heart valve: Secondary | ICD-10-CM | POA: Diagnosis not present

## 2022-05-13 DIAGNOSIS — E7801 Familial hypercholesterolemia: Secondary | ICD-10-CM | POA: Diagnosis not present

## 2022-05-13 DIAGNOSIS — Z862 Personal history of diseases of the blood and blood-forming organs and certain disorders involving the immune mechanism: Secondary | ICD-10-CM | POA: Diagnosis not present

## 2022-05-13 DIAGNOSIS — R519 Headache, unspecified: Secondary | ICD-10-CM | POA: Diagnosis not present

## 2022-05-13 DIAGNOSIS — K5900 Constipation, unspecified: Secondary | ICD-10-CM | POA: Diagnosis not present

## 2022-05-13 DIAGNOSIS — K219 Gastro-esophageal reflux disease without esophagitis: Secondary | ICD-10-CM | POA: Diagnosis not present

## 2022-05-13 DIAGNOSIS — G4700 Insomnia, unspecified: Secondary | ICD-10-CM | POA: Diagnosis not present

## 2022-05-13 DIAGNOSIS — R030 Elevated blood-pressure reading, without diagnosis of hypertension: Secondary | ICD-10-CM | POA: Diagnosis not present

## 2022-05-13 DIAGNOSIS — Z8774 Personal history of (corrected) congenital malformations of heart and circulatory system: Secondary | ICD-10-CM | POA: Diagnosis not present

## 2022-05-19 DIAGNOSIS — I6782 Cerebral ischemia: Secondary | ICD-10-CM | POA: Diagnosis not present

## 2022-05-19 DIAGNOSIS — R519 Headache, unspecified: Secondary | ICD-10-CM | POA: Diagnosis not present

## 2022-06-01 DIAGNOSIS — R519 Headache, unspecified: Secondary | ICD-10-CM | POA: Diagnosis not present

## 2022-06-01 DIAGNOSIS — E7801 Familial hypercholesterolemia: Secondary | ICD-10-CM | POA: Diagnosis not present

## 2022-06-01 DIAGNOSIS — Z862 Personal history of diseases of the blood and blood-forming organs and certain disorders involving the immune mechanism: Secondary | ICD-10-CM | POA: Diagnosis not present

## 2022-06-01 DIAGNOSIS — Z6825 Body mass index (BMI) 25.0-25.9, adult: Secondary | ICD-10-CM | POA: Diagnosis not present

## 2022-06-01 DIAGNOSIS — K5900 Constipation, unspecified: Secondary | ICD-10-CM | POA: Diagnosis not present

## 2022-06-01 DIAGNOSIS — M810 Age-related osteoporosis without current pathological fracture: Secondary | ICD-10-CM | POA: Diagnosis not present

## 2022-06-01 DIAGNOSIS — G4700 Insomnia, unspecified: Secondary | ICD-10-CM | POA: Diagnosis not present

## 2022-06-01 DIAGNOSIS — Z8774 Personal history of (corrected) congenital malformations of heart and circulatory system: Secondary | ICD-10-CM | POA: Diagnosis not present

## 2022-06-01 DIAGNOSIS — K219 Gastro-esophageal reflux disease without esophagitis: Secondary | ICD-10-CM | POA: Diagnosis not present

## 2022-06-01 DIAGNOSIS — Z952 Presence of prosthetic heart valve: Secondary | ICD-10-CM | POA: Diagnosis not present

## 2022-06-01 DIAGNOSIS — R030 Elevated blood-pressure reading, without diagnosis of hypertension: Secondary | ICD-10-CM | POA: Diagnosis not present

## 2022-06-07 ENCOUNTER — Telehealth: Payer: Self-pay | Admitting: Internal Medicine

## 2022-06-07 DIAGNOSIS — R42 Dizziness and giddiness: Secondary | ICD-10-CM

## 2022-06-07 NOTE — Telephone Encounter (Signed)
STAT if HR is under 50 or over 120 (normal HR is 60-100 beats per minute)  What is your heart rate? NA  Do you have a log of your heart rate readings (document readings)? No  Do you have any other symptoms? Dizziness, headaches   Pt states that her HR has been running low for a few weeks

## 2022-06-07 NOTE — Telephone Encounter (Signed)
Dizziness with standing or bending not uncommon Can set up for 48 hour monitor

## 2022-06-07 NOTE — Telephone Encounter (Signed)
Ms.Cortinas reports onset of dizziness on April 2.  She was walking dog and bent over to pick up after the dog and noticed dizziness with position change.  Her symptoms worsen when she bends over, gets out of bed   She walks on treadmill daily w/o symptoms.HR was 50   She is going to speak with pcp    I will FYI Dr.Ross

## 2022-06-08 ENCOUNTER — Ambulatory Visit: Payer: Medicare HMO | Attending: Internal Medicine

## 2022-06-08 DIAGNOSIS — R42 Dizziness and giddiness: Secondary | ICD-10-CM

## 2022-06-08 NOTE — Telephone Encounter (Signed)
Spoke with pt and she agrees to wear monitor. Order placed.

## 2022-06-10 DIAGNOSIS — L84 Corns and callosities: Secondary | ICD-10-CM | POA: Diagnosis not present

## 2022-06-10 DIAGNOSIS — M2041 Other hammer toe(s) (acquired), right foot: Secondary | ICD-10-CM | POA: Diagnosis not present

## 2022-06-10 DIAGNOSIS — M79671 Pain in right foot: Secondary | ICD-10-CM | POA: Diagnosis not present

## 2022-06-10 DIAGNOSIS — M2042 Other hammer toe(s) (acquired), left foot: Secondary | ICD-10-CM | POA: Diagnosis not present

## 2022-06-10 DIAGNOSIS — M2011 Hallux valgus (acquired), right foot: Secondary | ICD-10-CM | POA: Diagnosis not present

## 2022-06-10 DIAGNOSIS — M79672 Pain in left foot: Secondary | ICD-10-CM | POA: Diagnosis not present

## 2022-06-11 DIAGNOSIS — R42 Dizziness and giddiness: Secondary | ICD-10-CM | POA: Diagnosis not present

## 2022-06-16 ENCOUNTER — Encounter: Payer: Self-pay | Admitting: Urology

## 2022-06-16 ENCOUNTER — Ambulatory Visit (INDEPENDENT_AMBULATORY_CARE_PROVIDER_SITE_OTHER): Payer: Medicare HMO | Admitting: Urology

## 2022-06-16 VITALS — BP 123/90 | HR 84 | Ht 64.0 in | Wt 143.0 lb

## 2022-06-16 DIAGNOSIS — N3281 Overactive bladder: Secondary | ICD-10-CM | POA: Diagnosis not present

## 2022-06-16 DIAGNOSIS — R350 Frequency of micturition: Secondary | ICD-10-CM | POA: Diagnosis not present

## 2022-06-16 DIAGNOSIS — R3129 Other microscopic hematuria: Secondary | ICD-10-CM | POA: Diagnosis not present

## 2022-06-16 LAB — MICROSCOPIC EXAMINATION: Bacteria, UA: NONE SEEN

## 2022-06-16 LAB — URINALYSIS, ROUTINE W REFLEX MICROSCOPIC
Bilirubin, UA: NEGATIVE
Glucose, UA: NEGATIVE
Ketones, UA: NEGATIVE
Leukocytes,UA: NEGATIVE
Nitrite, UA: NEGATIVE
Protein,UA: NEGATIVE
Specific Gravity, UA: <1.005 — ABNORMAL LOW (ref 1.005–1.030)
Urobilinogen, Ur: 0.2 mg/dL (ref 0.2–1.0)
pH, UA: 6 (ref 5.0–7.5)

## 2022-06-16 LAB — BLADDER SCAN AMB NON-IMAGING: Scan Result: 48

## 2022-06-16 NOTE — Patient Instructions (Signed)
Hematuria, Adult Hematuria is blood in the urine. Blood may be visible in the urine, or it may be identified with a test. This condition can be caused by infections of the bladder, urethra, kidney, or prostate. Other possible causes include: Kidney stones. Cancer of the urinary tract. Too much calcium in the urine. Conditions that are passed from parent to child (inherited conditions). Exercise that requires a lot of energy. Infections can usually be treated with medicine, and a kidney stone usually will pass through your urine. If neither of these is the cause of your hematuria, more tests may be needed to identify the cause of your symptoms. It is very important to tell your health care provider about any blood in your urine, even if it is painless or the blood stops without treatment. Blood in the urine, when it happens and then stops and then happens again, can be a symptom of a very serious condition, including cancer. There is no pain in the initial stages of many urinary cancers. Follow these instructions at home: Medicines Take over-the-counter and prescription medicines only as told by your health care provider. If you were prescribed an antibiotic medicine, take it as told by your health care provider. Do not stop taking the antibiotic even if you start to feel better. Eating and drinking Drink enough fluid to keep your urine pale yellow. It is recommended that you drink 3-4 quarts (2.8-3.8 L) a day. If you have been diagnosed with an infection, drinking cranberry juice in addition to large amounts of water is recommended. Avoid caffeine, tea, and carbonated beverages. These tend to irritate the bladder. Avoid alcohol because it may irritate the prostate (in males). General instructions If you have been diagnosed with a kidney stone, follow your health care provider's instructions about straining your urine to catch the stone. Empty your bladder often. Avoid holding urine for long  periods of time. If you are female: After a bowel movement, wipe from front to back and use each piece of toilet paper only once. Empty your bladder before and after sex. Pay attention to any changes in your symptoms. Tell your health care provider about any changes or any new symptoms. It is up to you to get the results of any tests. Ask your health care provider, or the department that is doing the test, when your results will be ready. Keep all follow-up visits. This is important. Contact a health care provider if: You develop back pain. You have a fever or chills. You have nausea or vomiting. Your symptoms do not improve after 3 days. Your symptoms get worse. Get help right away if: You develop severe vomiting and are unable to take medicine without vomiting. You develop severe pain in your back or abdomen even though you are taking medicine. You pass a large amount of blood in your urine. You pass blood clots in your urine. You feel very weak or like you might faint. You faint. Summary Hematuria is blood in the urine. It has many possible causes. It is very important that you tell your health care provider about any blood in your urine, even if it is painless or the blood stops without treatment. Take over-the-counter and prescription medicines only as told by your health care provider. Drink enough fluid to keep your urine pale yellow. This information is not intended to replace advice given to you by your health care provider. Make sure you discuss any questions you have with your health care provider. Document Revised: 09/19/2019   Document Reviewed: 09/19/2019 Elsevier Patient Education  2023 Elsevier Inc. Overactive Bladder, Adult  Overactive bladder is a condition in which a person has a sudden and frequent need to urinate. A person might also leak urine if he or she cannot get to the bathroom fast enough (urinary incontinence). Sometimes, symptoms can interfere with work or  social activities. What are the causes? Overactive bladder is associated with poor nerve signals between your bladder and your brain. Your bladder may get the signal to empty before it is full. You may also have very sensitive muscles that make your bladder squeeze too soon. This condition may also be caused by other factors, such as: Medical conditions: Urinary tract infection. Infection of nearby tissues. Prostate enlargement. Bladder stones, inflammation, or tumors. Diabetes. Muscle or nerve weakness, especially from these conditions: A spinal cord injury. Stroke. Multiple sclerosis. Parkinson's disease. Other causes: Surgery on the uterus or urethra. Drinking too much caffeine or alcohol. Certain medicines, especially those that eliminate extra fluid in the body (diuretics). Constipation. What increases the risk? You may be at greater risk for overactive bladder if you: Are an older adult. Smoke. Are going through menopause. Have prostate problems. Have a neurological disease, such as stroke, dementia, Parkinson's disease, or multiple sclerosis (MS). Eat or drink alcohol, spicy food, caffeine, and other things that irritate the bladder. Are overweight or obese. What are the signs or symptoms? Symptoms of this condition include a sudden, strong urge to urinate. Other symptoms include: Leaking urine. Urinating 8 or more times a day. Waking up to urinate 2 or more times overnight. How is this diagnosed? This condition may be diagnosed based on: Your symptoms and medical history. A physical exam. Blood or urine tests to check for possible causes, such as infection. You may also need to see a health care provider who specializes in urinary tract problems. This is called a urologist. How is this treated? Treatment for overactive bladder depends on the cause of your condition and whether it is mild or severe. Treatment may include: Bladder training, such as: Learning to control  the urge to urinate by following a schedule to urinate at regular intervals. Doing Kegel exercises to strengthen the pelvic floor muscles that support your bladder. Special devices, such as: Biofeedback. This uses sensors to help you become aware of your body's signals. Electrical stimulation. This uses electrodes placed inside the body (implanted) or outside the body. These electrodes send gentle pulses of electricity to strengthen the nerves or muscles that control the bladder. Women may use a plastic device, called a pessary, that fits into the vagina and supports the bladder. Medicines, such as: Antibiotics to treat bladder infection. Antispasmodics to stop the bladder from releasing urine at the wrong time. Tricyclic antidepressants to relax bladder muscles. Injections of botulinum toxin type A directly into the bladder tissue to relax bladder muscles. Surgery, such as: A device may be implanted to help manage the nerve signals that control urination. An electrode may be implanted to stimulate electrical signals in the bladder. A procedure may be done to change the shape of the bladder. This is done only in very severe cases. Follow these instructions at home: Eating and drinking  Make diet or lifestyle changes recommended by your health care provider. These may include: Drinking fluids throughout the day and not only with meals. Cutting down on caffeine or alcohol. Eating a healthy and balanced diet to prevent constipation. This may include: Choosing foods that are high in fiber, such as  beans, whole grains, and fresh fruits and vegetables. Limiting foods that are high in fat and processed sugars, such as fried and sweet foods. Lifestyle  Lose weight if needed. Do not use any products that contain nicotine or tobacco. These include cigarettes, chewing tobacco, and vaping devices, such as e-cigarettes. If you need help quitting, ask your health care provider. General  instructions Take over-the-counter and prescription medicines only as told by your health care provider. If you were prescribed an antibiotic medicine, take it as told by your health care provider. Do not stop taking the antibiotic even if you start to feel better. Use any implants or pessary as told by your health care provider. If needed, wear pads to absorb urine leakage. Keep a log to track how much and when you drink, and when you need to urinate. This will help your health care provider monitor your condition. Keep all follow-up visits. This is important. Contact a health care provider if: You have a fever or chills. Your symptoms do not get better with treatment. Your pain and discomfort get worse. You have more frequent urges to urinate. Get help right away if: You are not able to control your bladder. Summary Overactive bladder refers to a condition in which a person has a sudden and frequent need to urinate. Several conditions may lead to an overactive bladder. Treatment for overactive bladder depends on the cause and severity of your condition. Making lifestyle changes, doing Kegel exercises, keeping a log, and taking medicines can help with this condition. This information is not intended to replace advice given to you by your health care provider. Make sure you discuss any questions you have with your health care provider. Document Revised: 10/08/2019 Document Reviewed: 10/08/2019 Elsevier Patient Education  2023 [employer].

## 2022-06-16 NOTE — Progress Notes (Signed)
06/16/2022 11:37 AM   Amber Bird 05-31-1945 [id number]  Referring provider: Donetta Potts, MD [address] [place],  [place] [address]  Urinary frequency   HPI: Amber Bird is a 77JY here for evaluation of urinary frequency. She is currently on mirabegron 25mg  daily. She started it one month ago. She has a hx of mitral valve replacement she had a fib and was previously on eliquis. Eliquis has been stopped. She did have nocturia 4x but mirabegron decreased it to 1x. She denies issues with incomplete emptying. PVR 48cc.  UA today shows 3-10 RBCs/hpf.    PMH: Past Medical History:  Diagnosis Date   Anemia    Asthma    CAD (coronary artery disease) 09/19/2020   Complication of anesthesia    "drop in blood pressure years ago after hysterectomy "    Depression    Dyspnea    Dysrhythmia    History of bleeding ulcers    Hypercholesteremia    Mitral valve prolapse    Multiple thyroid nodules    Osteoarthritis of knees, bilateral 05/23/2019   Sarcoidosis    Seasonal allergies     Surgical History: Past Surgical History:  Procedure Laterality Date   ABDOMINAL HYSTERECTOMY     complete.    BIOPSY  10/02/2018   Procedure: BIOPSY;  Surgeon: Corbin Ade, MD;  Location: [facility];  Service: Endoscopy;;  gastric   bleeding ulcer     CATARACT EXTRACTION W/PHACO Left 06/11/2019   Procedure: CATARACT EXTRACTION PHACO AND INTRAOCULAR LENS PLACEMENT (IOC) CDE: 4.63;  Surgeon: Fabio Pierce, MD;  Location: [facility];  Service: Ophthalmology;  Laterality: Left;   CATARACT EXTRACTION W/PHACO Right 06/25/2019   Procedure: CATARACT EXTRACTION PHACO AND INTRAOCULAR LENS PLACEMENT RIGHT EYE;  Surgeon: Fabio Pierce, MD;  Location: [facility];  Service: Ophthalmology;  Laterality: Right;  CDE: 5.61   CHOLECYSTECTOMY     COLONOSCOPY N/A 12/18/2015   Rourk: diverticulosis in sigmoid colon, otherwise normal    COLONOSCOPY WITH PROPOFOL N/A 03/17/2022   Procedure: COLONOSCOPY WITH  PROPOFOL;  Surgeon: Corbin Ade, MD;  Location: [facility];  Service: Endoscopy;  Laterality: N/A;  9:00 am   Cyst removed from ovary  1973   dental implants     ESOPHAGOGASTRODUODENOSCOPY (EGD) WITH PROPOFOL N/A 10/02/2018   Mild Schatzki ring status post dilation, small hiatal hernia, bleeding erosive gastropathy, gastritis on bx, no h pylori.  Procedure: ESOPHAGOGASTRODUODENOSCOPY (EGD) WITH PROPOFOL;  Surgeon: Corbin Ade, MD;  Location: [facility];  Service: Endoscopy;  Laterality: N/A;  12:45pm   LAPAROSCOPIC CHOLECYSTECTOMY SINGLE SITE WITH INTRAOPERATIVE CHOLANGIOGRAM N/A 05/12/2016   Procedure: LAPAROSCOPIC CHOLECYSTECTOMY SINGLE SITE WITH INTRAOPERATIVE CHOLANGIOGRAM;  Surgeon: Karie Soda, MD;  Location: [facility];  Service: General;  Laterality: N/A;   left hammer toe repair     MALONEY DILATION N/A 10/02/2018   Procedure: Elease Hashimoto DILATION;  Surgeon: Corbin Ade, MD;  Location: [facility];  Service: Endoscopy;  Laterality: N/A;   MITRAL VALVE REPLACEMENT     RIGHT/LEFT HEART CATH AND CORONARY ANGIOGRAPHY N/A 09/18/2020   Procedure: RIGHT/LEFT HEART CATH AND CORONARY ANGIOGRAPHY;  Surgeon: Swaziland, Peter M, MD;  [facility]: [facility] IN[facility]ASIVE CV LAB;  Service: Cardiovascular;  Laterality: N/A;   TEE WITHOUT CARDIOVERSION N/A 09/18/2020   Procedure: TRANSESOPHAGEAL ECHOCARDIOGRAM (TEE);  Surgeon: Wendall Stade, MD;  Location: [facility];  Service: Cardiovascular;  Laterality: N/A;   thyroid needle biopsy     VIDEO BRONCHOSCOPY Bilateral 03/21/2017  Did not have done    Home Medications:  Allergies as of 06/16/2022       Reactions   Other Anaphylaxis   "Cigarette smoke and/or strong chemical smells make my throat start to close"        Medication List        Accurate as of Jun 16, 2022 11:37 AM. If you have any questions, ask your nurse or doctor.          albuterol 108 (90 Base) MCG/ACT inhaler Commonly known as: VENTOLIN HFA Inhale 1-2 puffs into the  lungs every 6 (six) hours as needed for wheezing or shortness of breath.   aspirin EC 81 MG tablet Take 1 tablet (81 mg total) by mouth daily. Swallow whole.   atorvastatin 20 MG tablet Commonly known as: LIPITOR Take 20 mg by mouth at bedtime.   CALCIUM-MAGNESIUM-ZINC PO Take 1 tablet by mouth in the morning, at noon, and at bedtime.   cetirizine 10 MG tablet Commonly known as: ZYRTEC Take 10 mg by mouth at bedtime.   hydrocortisone 2.5 % rectal cream Commonly known as: ANUSOL-HC Place 1 Application rectally 2 (two) times daily. What changed:  when to take this reasons to take this   linaclotide 72 MCG capsule Commonly known as: Linzess Take 1 capsule (72 mcg total) by mouth daily before breakfast.   metoprolol succinate 25 MG 24 hr tablet Commonly known as: Toprol XL Take 1 tablet (25 mg total) by mouth daily.   Myrbetriq 25 MG Tb24 tablet Generic drug: mirabegron ER Take 25 mg by mouth daily.   pantoprazole 40 MG tablet Commonly known as: PROTONIX Take 1 tablet (40 mg total) by mouth daily.   Vitamin D3 125 MCG (5000 UT) Tabs Take 5,000 Units by mouth every other day.   zolpidem 5 MG tablet Commonly known as: AMBIEN Take 5 mg by mouth at bedtime.        Allergies:  Allergies  Allergen Reactions   Other Anaphylaxis    "Cigarette smoke and/or strong chemical smells make my throat start to close"    Family History: Family History  Problem Relation Age of Onset   Colon cancer Other    Breast cancer Mother    Tuberculosis Mother    Heart disease Father     Social History:  reports that she has never smoked. She has been exposed to tobacco smoke. She has never used smokeless tobacco. She reports that she does not drink alcohol and does not use drugs.  ROS: All other review of systems were reviewed and are negative except what is noted above in HPI  Physical Exam: BP (!) 123/90   Pulse 84   Ht 5\' 4"  (1.626 m)   Wt 143 lb (64.9 kg)   BMI 24.55  kg/m   Constitutional:  Alert and oriented, No acute distress. HEENT: [place] AT, moist mucus membranes.  Trachea midline, no masses. Cardiovascular: No clubbing, cyanosis, or edema. Respiratory: Normal respiratory effort, no increased work of breathing. GI: Abdomen is soft, nontender, nondistended, no abdominal masses GU: No CVA tenderness.  Lymph: No cervical or inguinal lymphadenopathy. Skin: No rashes, bruises or suspicious lesions. Neurologic: Grossly intact, no focal deficits, moving all 4 extremities. Psychiatric: Normal mood and affect.  Laboratory Data: Lab Results  Component Value Date   WBC 7.0 12/30/2021   HGB 14.7 12/30/2021   HCT 44.2 12/30/2021   MCV 92.5 12/30/2021   PLT 287 12/30/2021    Lab Results  Component Value  Date   CREATININE 0.90 01/23/2021    No results found for: "PSA"  No results found for: "TESTOSTERONE"  Lab Results  Component Value Date   HGBA1C 5.5 02/03/2017    Urinalysis    Component Value Date/Time   COLORURINE STRAW (A) 09/07/2018 0909   APPEARANCEUR CLEAR 09/07/2018 0909   LABSPEC 1.003 (L) 09/07/2018 0909   PHURINE 6.0 09/07/2018 0909   GLUCOSEU NEGATIVE 09/07/2018 0909   HGBUR NEGATIVE 09/07/2018 0909   BILIRUBINUR NEGATIVE 09/07/2018 0909   KETONESUR NEGATIVE 09/07/2018 0909   PROTEINUR NEGATIVE 09/07/2018 0909   NITRITE NEGATIVE 09/07/2018 0909   LEUKOCYTESUR NEGATIVE 09/07/2018 0909    Lab Results  Component Value Date   BACTERIA NONE SEEN 06/24/2017    Pertinent Imaging:  No results found for this or any previous visit.  No results found for this or any previous visit.  No results found for this or any previous visit.  No results found for this or any previous visit.  Results for orders placed during the hospital encounter of 07/18/17  US RENAL  Narrative CLINICAL DATA:  77 year old female with congenital obstruction ureteral pelvic junction. Initial encounter.  EXAM: RENAL / URINARY TRACT ULTRASOUND  COMPLETE  COMPARISON:  03/31/2015 CT.  FINDINGS: Right Kidney:  Length: 9.5 cm. Under rotation right kidney. Fullness right renal collecting system similar to prior CT suggestive of mild right ureteral pelvic junction obstruction.  Left Kidney:  Length: 9.6 cm. Echogenicity within normal limits. No mass or hydronephrosis visualized.  Bladder:  Appears normal for degree of bladder distention.  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 Findings suggestive of mild right ureteral pelvic junction obstruction. Appearance similar to the prior CT.  Otherwise negative exam.   Electronically Signed By: Lacy Duverney M.D. On: 07/18/2017 11:03  No valid procedures specified. No results found for this or any previous visit.  No results found for this or any previous visit.   Assessment & Plan:    1. Urine frequency -continue mirabegron 25mg  daily - Urinalysis, Routine w reflex microscopic - BLADDER SCAN AMB NON-IMAGING  2. OAB (overactive bladder) -mirabegron 25mg  daily  3. Microhematuria -BMP -CT hematuria -office cystoscopy    No follow-ups on file.  Wilkie Aye, MD  [facility] [place]

## 2022-06-16 NOTE — Progress Notes (Signed)
post void residual =19ml

## 2022-06-17 DIAGNOSIS — R519 Headache, unspecified: Secondary | ICD-10-CM | POA: Diagnosis not present

## 2022-06-17 DIAGNOSIS — R42 Dizziness and giddiness: Secondary | ICD-10-CM | POA: Diagnosis not present

## 2022-06-17 DIAGNOSIS — I671 Cerebral aneurysm, nonruptured: Secondary | ICD-10-CM | POA: Diagnosis not present

## 2022-06-18 DIAGNOSIS — R7309 Other abnormal glucose: Secondary | ICD-10-CM | POA: Diagnosis not present

## 2022-06-18 DIAGNOSIS — K219 Gastro-esophageal reflux disease without esophagitis: Secondary | ICD-10-CM | POA: Diagnosis not present

## 2022-06-21 ENCOUNTER — Telehealth: Payer: Self-pay | Admitting: Internal Medicine

## 2022-06-21 NOTE — Telephone Encounter (Signed)
Patient called stated she was calling for monitor test results. Advised to the patient Dr. Nydia Bouton has not finalized test, the office will contact pt with Dr. Tenny Craw recommendations. Pt explained she still has dizziness , hypotension, and blood in urine. Pt stated she is scheduled for a cysto on 7/8, she has contact PCP with her other symptoms. Will forward to MD and nurse for advise.  Blood pressure  5/16 95/62 HR 88 5/16 96/63 HR 73 ( 1530).

## 2022-06-21 NOTE — Telephone Encounter (Signed)
Forwarded to O.Jackson,LPN who had called patient.

## 2022-06-21 NOTE — Telephone Encounter (Signed)
Patient wants a call back regarding heart monitor and other test results.

## 2022-06-21 NOTE — Telephone Encounter (Signed)
Patient is returning call and is requesting call back.  

## 2022-06-21 NOTE — Telephone Encounter (Signed)
Left message for the patient to call the clinic.

## 2022-06-23 ENCOUNTER — Telehealth: Payer: Self-pay

## 2022-06-23 NOTE — Telephone Encounter (Signed)
Patient notified and verbalized understanding. Patient had no questions or concerns at this time.  

## 2022-06-23 NOTE — Telephone Encounter (Signed)
-----   Message from Dietrich Pates V, MD sent at 06/23/2022 10:25 AM EDT ----- Monitor shows Sinus rhythm to sinus tachycardia  Average HR 74bpm .   Few bursts of SVT, VT,  Very short lived  Not sensed Diary entries/triggered events corresponded to SR and SR with PAC  I would keep on same regimen   Stay hydrated

## 2022-06-23 NOTE — Telephone Encounter (Signed)
Ronks pt 

## 2022-06-23 NOTE — Telephone Encounter (Signed)
Left a message for patient to call office for results.

## 2022-06-23 NOTE — Telephone Encounter (Signed)
Pt returning call

## 2022-06-23 NOTE — Telephone Encounter (Signed)
Left a message for the pt to call back.  

## 2022-07-26 ENCOUNTER — Telehealth: Payer: Self-pay

## 2022-07-26 DIAGNOSIS — Z8774 Personal history of (corrected) congenital malformations of heart and circulatory system: Secondary | ICD-10-CM | POA: Diagnosis not present

## 2022-07-26 DIAGNOSIS — Z952 Presence of prosthetic heart valve: Secondary | ICD-10-CM | POA: Diagnosis not present

## 2022-07-26 DIAGNOSIS — M810 Age-related osteoporosis without current pathological fracture: Secondary | ICD-10-CM | POA: Diagnosis not present

## 2022-07-26 DIAGNOSIS — Z862 Personal history of diseases of the blood and blood-forming organs and certain disorders involving the immune mechanism: Secondary | ICD-10-CM | POA: Diagnosis not present

## 2022-07-26 DIAGNOSIS — E7801 Familial hypercholesterolemia: Secondary | ICD-10-CM | POA: Diagnosis not present

## 2022-07-26 DIAGNOSIS — K5900 Constipation, unspecified: Secondary | ICD-10-CM | POA: Diagnosis not present

## 2022-07-26 DIAGNOSIS — R519 Headache, unspecified: Secondary | ICD-10-CM | POA: Diagnosis not present

## 2022-07-26 DIAGNOSIS — Z0001 Encounter for general adult medical examination with abnormal findings: Secondary | ICD-10-CM | POA: Diagnosis not present

## 2022-07-26 DIAGNOSIS — K219 Gastro-esophageal reflux disease without esophagitis: Secondary | ICD-10-CM | POA: Diagnosis not present

## 2022-07-26 DIAGNOSIS — Z23 Encounter for immunization: Secondary | ICD-10-CM | POA: Diagnosis not present

## 2022-07-26 DIAGNOSIS — G4700 Insomnia, unspecified: Secondary | ICD-10-CM | POA: Diagnosis not present

## 2022-07-26 NOTE — Telephone Encounter (Signed)
CT authorized patient provided with scheduling number and advised to try to schedule prior to her cysto on 07/08.

## 2022-07-26 NOTE — Telephone Encounter (Signed)
Following up on a triage call from 06/21.  Pt is scheduled for cysto on 07/08.  CT ordered but not scheduled.  Does pt need to call an schedule this or are we still waiting for authorization.  Patient scheduled for 06/25 for BMP prior to apt.

## 2022-07-27 ENCOUNTER — Other Ambulatory Visit: Payer: Medicare HMO

## 2022-07-27 DIAGNOSIS — R3129 Other microscopic hematuria: Secondary | ICD-10-CM

## 2022-07-28 LAB — BASIC METABOLIC PANEL
BUN/Creatinine Ratio: 13 (ref 12–28)
BUN: 12 mg/dL (ref 8–27)
CO2: 24 mmol/L (ref 20–29)
Calcium: 9.9 mg/dL (ref 8.7–10.3)
Chloride: 101 mmol/L (ref 96–106)
Creatinine, Ser: 0.9 mg/dL (ref 0.57–1.00)
Glucose: 95 mg/dL (ref 70–99)
Potassium: 4.6 mmol/L (ref 3.5–5.2)
Sodium: 138 mmol/L (ref 134–144)
eGFR: 66 mL/min/{1.73_m2} (ref >59)

## 2022-07-30 ENCOUNTER — Telehealth: Payer: Self-pay

## 2022-07-30 NOTE — Telephone Encounter (Signed)
Patient left a voice message 07-30-2022.  Calling for recent lab results  Please advise.

## 2022-08-02 NOTE — Telephone Encounter (Signed)
Patient called with no answer. Message left to return call to office. 

## 2022-08-04 ENCOUNTER — Ambulatory Visit: Payer: Medicare HMO | Admitting: Neurology

## 2022-08-04 ENCOUNTER — Encounter (HOSPITAL_COMMUNITY): Payer: Self-pay | Admitting: Radiology

## 2022-08-04 ENCOUNTER — Ambulatory Visit (HOSPITAL_COMMUNITY)
Admission: RE | Admit: 2022-08-04 | Discharge: 2022-08-04 | Disposition: A | Discharge status: Home / Self Care | Payer: Medicare HMO | Source: Ambulatory Visit | Attending: Urology | Admitting: Urology

## 2022-08-04 DIAGNOSIS — R3129 Other microscopic hematuria: Secondary | ICD-10-CM | POA: Insufficient documentation

## 2022-08-04 DIAGNOSIS — K449 Diaphragmatic hernia without obstruction or gangrene: Secondary | ICD-10-CM | POA: Diagnosis not present

## 2022-08-04 DIAGNOSIS — N3289 Other specified disorders of bladder: Secondary | ICD-10-CM | POA: Diagnosis not present

## 2022-08-04 MED ORDER — IOHEXOL 300 MG/ML  SOLN
125.0000 mL | Freq: Once | INTRAMUSCULAR | Status: AC | PRN
  Administered 2022-08-04: 125 mL via INTRAVENOUS

## 2022-08-09 ENCOUNTER — Ambulatory Visit (INDEPENDENT_AMBULATORY_CARE_PROVIDER_SITE_OTHER): Payer: Medicare HMO | Admitting: Urology

## 2022-08-09 VITALS — BP 138/72 | HR 65

## 2022-08-09 DIAGNOSIS — R3129 Other microscopic hematuria: Secondary | ICD-10-CM

## 2022-08-09 LAB — URINALYSIS, ROUTINE W REFLEX MICROSCOPIC
Bilirubin, UA: NEGATIVE
Glucose, UA: NEGATIVE
Ketones, UA: NEGATIVE
Nitrite, UA: NEGATIVE
Protein,UA: NEGATIVE
RBC, UA: NEGATIVE
Specific Gravity, UA: 1.01 (ref 1.005–1.030)
Urobilinogen, Ur: 0.2 mg/dL (ref 0.2–1.0)
pH, UA: 6.5 (ref 5.0–7.5)

## 2022-08-09 LAB — MICROSCOPIC EXAMINATION
Bacteria, UA: NONE SEEN
RBC, Urine: NONE SEEN /hpf (ref 0–2)

## 2022-08-09 MED ORDER — CIPROFLOXACIN HCL 500 MG PO TABS
500.0000 mg | ORAL_TABLET | Freq: Once | ORAL | Status: AC
  Administered 2022-08-09: 500 mg via ORAL

## 2022-08-09 NOTE — Progress Notes (Unsigned)
   08/09/22  [id number]   HPI: Amber Bird is a 89YN here for cystoscopy for hematuria There were no vitals taken for this visit. NED. A&Ox3.   No respiratory distress   Abd soft, NT, ND Normal external genitalia with patent urethral meatus  Cystoscopy Procedure Note  Patient identification was confirmed, informed consent was obtained, and patient was prepped using Betadine solution.  Lidocaine jelly was administered per urethral meatus.    Procedure: - Flexible cystoscope introduced, without any difficulty.   - Thorough search of the bladder revealed:    normal urethral meatus    normal urothelium    no stones    no ulcers     no tumors    no urethral polyps    no trabeculation  - Ureteral orifices were normal in position and appearance.  Post-Procedure: - Patient tolerated the procedure well  Assessment/ Plan: Followup 6 months   No follow-ups on file.  Wilkie Aye, MD

## 2022-08-10 ENCOUNTER — Encounter: Payer: Self-pay | Admitting: Urology

## 2022-08-10 NOTE — Patient Instructions (Signed)

## 2022-08-12 DIAGNOSIS — L821 Other seborrheic keratosis: Secondary | ICD-10-CM | POA: Diagnosis not present

## 2022-08-12 DIAGNOSIS — D485 Neoplasm of uncertain behavior of skin: Secondary | ICD-10-CM | POA: Diagnosis not present

## 2022-08-12 DIAGNOSIS — L814 Other melanin hyperpigmentation: Secondary | ICD-10-CM | POA: Diagnosis not present

## 2022-08-18 ENCOUNTER — Ambulatory Visit: Payer: Medicare HMO | Admitting: Neurology

## 2022-09-10 DIAGNOSIS — M25532 Pain in left wrist: Secondary | ICD-10-CM | POA: Diagnosis not present

## 2022-09-13 ENCOUNTER — Other Ambulatory Visit (HOSPITAL_COMMUNITY): Payer: Self-pay | Admitting: Internal Medicine

## 2022-09-13 DIAGNOSIS — Z1231 Encounter for screening mammogram for malignant neoplasm of breast: Secondary | ICD-10-CM

## 2022-10-11 ENCOUNTER — Ambulatory Visit (HOSPITAL_COMMUNITY)
Admission: RE | Admit: 2022-10-11 | Discharge: 2022-10-11 | Disposition: A | Discharge status: Home / Self Care | Payer: Medicare HMO | Source: Ambulatory Visit | Attending: Internal Medicine | Admitting: Internal Medicine

## 2022-10-11 ENCOUNTER — Encounter (HOSPITAL_COMMUNITY): Payer: Self-pay

## 2022-10-11 DIAGNOSIS — Z1231 Encounter for screening mammogram for malignant neoplasm of breast: Secondary | ICD-10-CM | POA: Insufficient documentation

## 2022-10-12 ENCOUNTER — Telehealth: Payer: Self-pay | Admitting: Internal Medicine

## 2022-10-12 MED ORDER — METOPROLOL SUCCINATE ER 25 MG PO TB24: 25.0000 mg | ORAL_TABLET | Freq: Every day | ORAL | 3 refills | Status: DC

## 2022-10-12 NOTE — Telephone Encounter (Signed)
Spoke to pt and verbalized that pt should continue taking medication as prescribed since medication was not d/c'd. Pt verbalized understanding.

## 2022-10-12 NOTE — Telephone Encounter (Signed)
Pt c/o medication issue:  1. Name of Medication:   metoprolol succinate (TOPROL XL) 25 MG 24 hr tablet    2. How are you currently taking this medication (dosage and times per day)?  Take 1 tablet (25 mg total) by mouth daily.       3. Are you having a reaction (difficulty breathing--STAT)? No  4. What is your medication issue? Pt is requesting a callback to find out if she should still be taking this medication or not. Please advise

## 2022-10-13 DIAGNOSIS — H01001 Unspecified blepharitis right upper eyelid: Secondary | ICD-10-CM | POA: Diagnosis not present

## 2022-10-13 DIAGNOSIS — H01002 Unspecified blepharitis right lower eyelid: Secondary | ICD-10-CM | POA: Diagnosis not present

## 2022-10-13 DIAGNOSIS — H01004 Unspecified blepharitis left upper eyelid: Secondary | ICD-10-CM | POA: Diagnosis not present

## 2022-10-13 DIAGNOSIS — H31002 Unspecified chorioretinal scars, left eye: Secondary | ICD-10-CM | POA: Diagnosis not present

## 2022-10-17 NOTE — Progress Notes (Unsigned)
Cardiology Office Note   Date:  10/19/2022   ID:  Amber Bird, DOB [birth year], MRN [id number]  PCP:  Amber Potts, MD  Cardiologist:   Amber Pates, MD   Patient presents for f/u from MV repair      History of Present Illness: Amber Bird is a 77 y.o. female with a history of  MVP and mitral regurgitaiton.   JUne 2022  PT complained of dyspnea on exertion   Echo showed MVP with significant mitral regurgitation    LVEF normal    TEE done to further evaluate The pt went on to have R/L heart cath and then was sent to [facility] (Dr Zebedee Iba) for repair on 12/01/20 (36 mm Ring)  She was discharged on 12/05/20   Dec 09, 2020 Pt readmitted for afib with RVR   Tx to [facility] to SR and started on Eliquis and Amio Eventually discontinued      July 2023 Pt had syncopal spell after taking dog out in early morning  Teted positive for COVID soon after    Aug 2023   Palpititations  Monitor showed No afib   Some ectopy  Rx Toprol XL    Echo LVEF normal     I saw the pt in Nov 2023    Patient wore monitor in May 2024  SR/ST   AVerage HR 74 bpm   Few bursts of SVT, VT   No AFib     Since seen the pt is doing good     Denies CP  Breathing is OK   Occasional dizziness with heat No fluttering in chest   Active   Current Meds  Medication Sig   albuterol (VENTOLIN HFA) 108 (90 Base) MCG/ACT inhaler Inhale 1-2 puffs into the lungs every 6 (six) hours as needed for wheezing or shortness of breath.   aspirin EC 81 MG tablet Take 1 tablet (81 mg total) by mouth daily. Swallow whole.   atorvastatin (LIPITOR) 20 MG tablet Take 20 mg by mouth at bedtime.   CALCIUM-MAGNESIUM-ZINC PO Take 1 tablet by mouth in the morning, at noon, and at bedtime.   cetirizine (ZYRTEC) 10 MG tablet Take 10 mg by mouth at bedtime.   Cholecalciferol (VITAMIN D3) 125 MCG (5000 UT) TABS Take 5,000 Units by mouth every other day.   hydrocortisone (ANUSOL-HC) 2.5 % rectal cream Place 1 Application rectally 2 (two)  times daily. (Patient taking differently: Place 1 Application rectally daily as needed for hemorrhoids.)   linaclotide (LINZESS) 72 MCG capsule Take 1 capsule (72 mcg total) by mouth daily before breakfast.   metoprolol succinate (TOPROL XL) 25 MG 24 hr tablet Take 1 tablet (25 mg total) by mouth daily.   MYRBETRIQ 25 MG TB24 tablet Take 25 mg by mouth daily.   pantoprazole (PROTONIX) 40 MG tablet Take 1 tablet (40 mg total) by mouth daily.   zolpidem (AMBIEN) 5 MG tablet Take 5 mg by mouth at bedtime.     Allergies:   Other   Past Medical History:  Diagnosis Date   Anemia    Asthma    CAD (coronary artery disease) 09/19/2020   Complication of anesthesia    "drop in blood pressure years ago after hysterectomy "    Depression    Dyspnea    Dysrhythmia    History of bleeding ulcers    Hypercholesteremia    Mitral valve prolapse    Multiple thyroid nodules    Osteoarthritis of  knees, bilateral 05/23/2019   Sarcoidosis    Seasonal allergies     Past Surgical History:  Procedure Laterality Date   ABDOMINAL HYSTERECTOMY     complete.    BIOPSY  10/02/2018   Procedure: BIOPSY;  Surgeon: Corbin Ade, MD;  [facility];  Service: Endoscopy;;  gastric   bleeding ulcer     CATARACT EXTRACTION W/PHACO Left 06/11/2019   Procedure: CATARACT EXTRACTION PHACO AND INTRAOCULAR LENS PLACEMENT (IOC) CDE: 4.63;  Surgeon: Fabio Pierce, MD;  Location: [facility];  Service: Ophthalmology;  Laterality: Left;   CATARACT EXTRACTION W/PHACO Right 06/25/2019   Procedure: CATARACT EXTRACTION PHACO AND INTRAOCULAR LENS PLACEMENT RIGHT EYE;  Surgeon: Fabio Pierce, MD;  Location: [facility];  Service: Ophthalmology;  Laterality: Right;  CDE: 5.61   CHOLECYSTECTOMY     COLONOSCOPY N/A 12/18/2015   Rourk: diverticulosis in sigmoid colon, otherwise normal    COLONOSCOPY WITH PROPOFOL N/A 03/17/2022   Procedure: COLONOSCOPY WITH PROPOFOL;  Surgeon: Corbin Ade, MD;  [facility];   Service: Endoscopy;  Laterality: N/A;  9:00 am   Cyst removed from ovary  1973   dental implants     ESOPHAGOGASTRODUODENOSCOPY (EGD) WITH PROPOFOL N/A 10/02/2018   Mild Schatzki ring status post dilation, small hiatal hernia, bleeding erosive gastropathy, gastritis on bx, no h pylori.  Procedure: ESOPHAGOGASTRODUODENOSCOPY (EGD) WITH PROPOFOL;  Surgeon: Corbin Ade, MD;  [facility];  Service: Endoscopy;  Laterality: N/A;  12:45pm   LAPAROSCOPIC CHOLECYSTECTOMY SINGLE SITE WITH INTRAOPERATIVE CHOLANGIOGRAM N/A 05/12/2016   Procedure: LAPAROSCOPIC CHOLECYSTECTOMY SINGLE SITE WITH INTRAOPERATIVE CHOLANGIOGRAM;  Surgeon: Karie Soda, MD;  Location: [facility];  Service: General;  Laterality: N/A;   left hammer toe repair     MALONEY DILATION N/A 10/02/2018   Procedure: Elease Hashimoto DILATION;  Surgeon: Corbin Ade, MD;  [facility];  Service: Endoscopy;  Laterality: N/A;   MITRAL VALVE REPLACEMENT     RIGHT/LEFT HEART CATH AND CORONARY ANGIOGRAPHY N/A 09/18/2020   Procedure: RIGHT/LEFT HEART CATH AND CORONARY ANGIOGRAPHY;  Surgeon: Swaziland, Peter M, MD;  [facility] CV LAB;  Service: Cardiovascular;  Laterality: N/A;   TEE WITHOUT CARDIOVERSION N/A 09/18/2020   Procedure: TRANSESOPHAGEAL ECHOCARDIOGRAM (TEE);  Surgeon: Wendall Stade, MD;  Location: [facility] ENDOSCOPY;  Service: Cardiovascular;  Laterality: N/A;   thyroid needle biopsy     VIDEO BRONCHOSCOPY Bilateral 03/21/2017   Did not have done     Social History:  The patient  reports that she has never smoked. She has been exposed to tobacco smoke. She has never used smokeless tobacco. She reports that she does not drink alcohol and does not use drugs.   Family History:  The patient's family history includes Breast cancer in her mother; Colon cancer in an other family member; Heart disease in her father; Tuberculosis in her mother.    ROS:  Please see the history of present illness. All other systems are  reviewed and  Negative to the above problem except as noted.    PHYSICAL EXAM: VS:  BP 122/70 (BP Location: Left Arm, Patient Position: Sitting, Cuff Size: Normal)   Pulse 75   Ht 5' 3.5" (1.613 m)   Wt 140 lb 6.4 oz (63.7 kg)   SpO2 97%   BMI 24.48 kg/m   GEN: Thin 77 yo in no acute distress  HEENT: normal  Neck: no JVD, no carotid bruit Cardiac: RRR;  No murmur   No LE  edema  Respiratory:  clear to auscultation bilaterally  GI: soft, nontender, No hepatomegaly  MS: no deformity Moving all extremities   Skin: warm and dry, no rash Neuro:  Strength and sensation are intact Psych: euthymic mood, full affect   EKG:  EKG is  ordered today.  NSR 75   Occasional PVC    Zio patch   Sept 2023  Patient had a min HR of 60 bpm, max HR of 193 bpm, and avg HR of 81 bpm. Predominant underlying rhythm was Sinus Rhythm. 4 Ventricular Tachycardia runs occurred, the run with the fastest interval lasting 4 beats with a max rate of 171 bpm, the longest lasting 7 beats with an avg rate of 125 bpm. 52 Supraventricular Tachycardia runs occurred, the run with the fastest interval lasting 5 beats with a max rate of 193 bpm, the longest lasting 14 beats with an avg rate of 97 bpm. Some episodes of Supraventricular Tachycardia may be possible Atrial Tachycardia with variable block. Isolated SVEs were rare (<1.0%), SVE Couplets were rare (<1.0%), and SVE Triplets were rare (<1.0%). Isolated VEs were rare (<1.0%, 11910), VE Couplets were rare (<1.0%, 199), and VE Triplets were rare (<1.0%, 8). Ventricular Bigeminy and Trigeminy were present.  Echo   AUg 2023  Right ventricular systolic function is normal. The right ventricular size is normal. There is normal pulmonary artery systolic pressure. The estimated right ventricular systolic pressure is 17.3 mmHg. 2. The mitral valve has been repaired/replaced. Mild mitral valve regurgitation. No evidence of mitral stenosis. 3. The aortic valve is  tricuspid. Aortic valve regurgitation is not visualized. Aortic valve sclerosis is present, with no evidence of aortic valve stenosis. 4. 5. There is mild dilatation of the aortic root, measuring 38 mm. The inferior vena cava is normal in size with greater than 50% respiratory variability, suggesting right atrial pressure of 3 mmHg.  Cath   09/18/20    Prox LAD to Mid LAD lesion is 25% stenosed.   The left ventricular systolic function is normal.   LV end diastolic pressure is normal.   The left ventricular ejection fraction is 55-65% by visual estimate.   There is severe (4+) mitral regurgitation.   There is severe (4+) mitral regurgitation and severe mitral valve prolapse.   Minimal nonobstructive CAD Severe posterior mitral valve prolapse with severe MR Normal right heart and LV filling pressures   Plan: consider referral for MV repair.  TEE   09/18/20  eft ventricular ejection fraction, by estimation, is 65 to 70%. The left ventricle has normal function. 1. 2. Right ventricular systolic function is normal. The right ventricular size is normal. Left atrial size was mildly dilated. No left atrial/left atrial appendage thrombus was detected. 3. Extensive 3 D imaging True Barlow's valve with billowing and prolpase of both anterior and posterior leaflets. Worst lesions are partial flail P2 and severe prolapse of P3 resulting in severe ecdentric anteriorly directed MR. Only partial reversal seen in right superior PV. . The mitral valve is myxomatous. Severe mitral valve regurgitation. Severe mitral stenosis. 4. 5. The aortic valve is tricuspid. Aortic valve regurgitation is not visualized. 6. Aortic dilatation noted. There is moderate dilatation of the ascending aorta.  Echo   07/22/20  1. Left ventricular ejection fraction, by estimation, is 60 to 65%. The left ventricle has normal function. The left ventricle demonstrates regional wall motion abnormalities (see  scoring diagram/findings for description). There is mild left ventricular hypertrophy. Left ventricular diastolic parameters were normal. Mild anteoseptal wall hypokinesis.  2. Right ventricular systolic function is normal. The right ventricular size is normal. There is normal pulmonary artery systolic pressure. 3. Left atrial size was severely dilated. 4. Right atrial size was moderately dilated. 5. Posterior leaflet prolapse resulting in eccentric anterior mitral regurgitation. The eccentricity makes it difficult to asses severity. MV to AV VTI ratio of 1.4 would suggest moderate to severe MR. Consider TEE. . The mitral valve is abnormal. Moderate to severe mitral valve regurgitation. 6. The aortic valve is tricuspid. Aortic valve regurgitation is not visualized. No aortic stenosis is present. 7. The inferior vena cava is normal in size with greater than 50% respiratory variability, suggesting right atrial pressure of 3 mmHg. Lipid Panel    Component Value Date/Time   CHOL 156 03/27/2021 1138   TRIG 108 03/27/2021 1138   HDL 62 03/27/2021 1138   CHOLHDL 2.5 03/27/2021 1138   VLDL 22 03/27/2021 1138   LDLCALC 72 03/27/2021 1138   LDLCALC 76 06/13/2017 1123      Wt Readings from Last 3 Encounters:  10/19/22 140 lb 6.4 oz (63.7 kg)  06/16/22 143 lb (64.9 kg)  03/17/22 150 lb 9.6 oz (68.3 kg)      ASSESSMENT AND PLAN:  1  Mitral regurgitation  Pt is s/p repair at [facility] in 2022  Doing very well  Excellent repair   Keep on ASA   Echo in 2023 mild MR  No murmur on exam      2  PAF   No recurrence of afib documented   Only occurred  after surgery   3  Syncope  Only one spell   Montior neg   Echo OK   Most likely situational  (early am, got out of bed, no fluids prior, coming down with COVID)    4  Palpitatoins  No documented arrhythmias after initial post op afib   5   Lipids   Keep on lipitor  Will get lipomed on current regimen    Plan for f/u next summer     Current  medicines are reviewed at length with the patient today.  The patient does not have concerns regarding medicines.  Signed, Amber Pates, MD  10/19/2022 10:59 AM    [facility] [address] [place], [place], [place]  [address] Phone: [phone]; Fax: [phone]

## 2022-10-19 ENCOUNTER — Ambulatory Visit: Payer: Medicare HMO | Attending: Internal Medicine | Admitting: Internal Medicine

## 2022-10-19 ENCOUNTER — Other Ambulatory Visit (HOSPITAL_COMMUNITY)
Admission: RE | Admit: 2022-10-19 | Discharge: 2022-10-19 | Disposition: A | Discharge status: Home / Self Care | Payer: Medicare HMO | Source: Ambulatory Visit | Attending: Internal Medicine | Admitting: Internal Medicine

## 2022-10-19 ENCOUNTER — Encounter: Payer: Self-pay | Admitting: Internal Medicine

## 2022-10-19 VITALS — BP 122/70 | HR 75 | Ht 63.5 in | Wt 140.4 lb

## 2022-10-19 DIAGNOSIS — I2510 Atherosclerotic heart disease of native coronary artery without angina pectoris: Secondary | ICD-10-CM

## 2022-10-19 NOTE — Patient Instructions (Signed)
Medication Instructions:  Your physician recommends that you continue on your current medications as directed. Please refer to the Current Medication list given to you today.  *If you need a refill on your cardiac medications before your next appointment, please call your pharmacy*   Lab Work: Your physician recommends that you return for lab work in: Today   If you have labs (blood work) drawn today and your tests are completely normal, you will receive your results only by: MyChart Message (if you have MyChart) OR A paper copy in the mail If you have any lab test that is abnormal or we need to change your treatment, we will call you to review the results.   Testing/Procedures: NONE    Follow-Up: At [facility], you and your health needs are our priority.  As part of our continuing mission to provide you with exceptional heart care, we have created designated Provider Care Teams.  These Care Teams include your primary Cardiologist (physician) and Advanced Practice Providers (APPs -  Physician Assistants and Nurse Practitioners) who all work together to provide you with the care you need, when you need it.  We recommend signing up for the patient portal called "MyChart".  Sign up information is provided on this After Visit Summary.  MyChart is used to connect with patients for Virtual Visits (Telemedicine).  Patients are able to view lab/test results, encounter notes, upcoming appointments, etc.  Non-urgent messages can be sent to your provider as well.   To learn more about what you can do with MyChart, go to [email or web address].    Your next appointment:    May / June   Provider:   You may see Dietrich Pates, MD or one of the following Advanced Practice Providers on your designated Care Team:   Randall An, PA-C  Jacolyn Reedy, PA-C     Other Instructions Thank you for choosing [facility]!

## 2022-10-22 ENCOUNTER — Telehealth: Payer: Self-pay | Admitting: Internal Medicine

## 2022-10-22 DIAGNOSIS — K5900 Constipation, unspecified: Secondary | ICD-10-CM | POA: Diagnosis not present

## 2022-10-22 DIAGNOSIS — K219 Gastro-esophageal reflux disease without esophagitis: Secondary | ICD-10-CM | POA: Diagnosis not present

## 2022-10-22 DIAGNOSIS — Z862 Personal history of diseases of the blood and blood-forming organs and certain disorders involving the immune mechanism: Secondary | ICD-10-CM | POA: Diagnosis not present

## 2022-10-22 DIAGNOSIS — Z6824 Body mass index (BMI) 24.0-24.9, adult: Secondary | ICD-10-CM | POA: Diagnosis not present

## 2022-10-22 DIAGNOSIS — R030 Elevated blood-pressure reading, without diagnosis of hypertension: Secondary | ICD-10-CM | POA: Diagnosis not present

## 2022-10-22 DIAGNOSIS — Z952 Presence of prosthetic heart valve: Secondary | ICD-10-CM | POA: Diagnosis not present

## 2022-10-22 DIAGNOSIS — G4700 Insomnia, unspecified: Secondary | ICD-10-CM | POA: Diagnosis not present

## 2022-10-22 DIAGNOSIS — Z23 Encounter for immunization: Secondary | ICD-10-CM | POA: Diagnosis not present

## 2022-10-22 DIAGNOSIS — E7801 Familial hypercholesterolemia: Secondary | ICD-10-CM | POA: Diagnosis not present

## 2022-10-22 DIAGNOSIS — R519 Headache, unspecified: Secondary | ICD-10-CM | POA: Diagnosis not present

## 2022-10-22 DIAGNOSIS — M810 Age-related osteoporosis without current pathological fracture: Secondary | ICD-10-CM | POA: Diagnosis not present

## 2022-10-22 NOTE — Telephone Encounter (Signed)
Spoke with pt and advised of result as below per Dr Tenny Craw.  Pt verbalizes understanding and thanked Charity fundraiser for the call. Lipids overall are very good  Keep on same meds Pricilla Riffle, MD 10/21/2022 10:53 PM EDT

## 2022-10-22 NOTE — Telephone Encounter (Signed)
Pt returning nurses call regarding results. Please advise

## 2022-11-04 DIAGNOSIS — M25532 Pain in left wrist: Secondary | ICD-10-CM | POA: Insufficient documentation

## 2022-11-17 ENCOUNTER — Ambulatory Visit (INDEPENDENT_AMBULATORY_CARE_PROVIDER_SITE_OTHER): Payer: Medicare HMO | Admitting: Orthopaedic Surgery

## 2022-11-17 ENCOUNTER — Encounter: Payer: Self-pay | Admitting: Orthopaedic Surgery

## 2022-11-17 VITALS — Ht 63.5 in | Wt 141.0 lb

## 2022-11-17 DIAGNOSIS — M545 Low back pain, unspecified: Secondary | ICD-10-CM | POA: Diagnosis not present

## 2022-11-17 DIAGNOSIS — G8929 Other chronic pain: Secondary | ICD-10-CM | POA: Diagnosis not present

## 2022-11-17 NOTE — Progress Notes (Signed)
Office Visit Note   Patient: Amber Bird           Date of Birth: 09-Nov-1945           MRN: [id number] Visit Date: 11/17/2022              Requested by: Donetta Potts, MD [address] [place],  [place] [address] PCP: Donetta Potts, MD   Assessment & Plan: Visit Diagnoses:  1. Chronic bilateral low back pain without sciatica     Plan: Patient is got some improvement and can gradually work on a walking program and let us know if she has increased symptoms.  We discussed possible therapy referral but currently she is improving.  If she gets increased symptoms she will let us know.  Follow-Up Instructions: No follow-ups on file.   Orders:  No orders of the defined types were placed in this encounter.  No orders of the defined types were placed in this encounter.     Procedures: No procedures performed   Clinical Data: No additional findings.   Subjective: Chief Complaint  Patient presents with   Lower Back - Pain    It has been hurting since Sunday, have no idea what caused it to start hurting. It hurts on the right side but does not go down leg. Have used heating pad and took Tylenol and all it did was dull the pain. I have been up since 2 am this morning with the pain.    HPI patient was significant right low back pain that started Sunday.  She states she has been up since 2 AM could not go back to sleep due to the pain.  Radiates into her buttocks.  She is using heating pad and taking Tylenol.  States it is hard for her to do her normal activities.  10/3 she had some methylprednisolone and had been seen for right wrist pain.  There is concern of swelling of the wrist with diagnosis of gout or pseudogout.  She had been prescribed colchicine which did not help and she stopped due to diarrhea.  No history of podagra.  Recent CT 08/04/2022 for hematuria workup did show mild to moderate multilevel lumbar degenerative changes in the lumbar spine.  Review of Systems  previous cholecystectomy history of mitral valve prolapse, coronary artery disease knee osteoarthritis.  Age-related osteoporosis without fracture.  Severe mitral insufficiency.   Objective: Vital Signs: Ht 5' 3.5" (1.613 m)   Wt 141 lb (64 kg)   BMI 24.59 kg/m   Physical Exam Constitutional:      Appearance: She is well-developed.  HENT:     Head: Normocephalic.     Right Ear: External ear normal.     Left Ear: External ear normal. There is no impacted cerumen.  Eyes:     Pupils: Pupils are equal, round, and reactive to light.  Neck:     Thyroid: No thyromegaly.     Trachea: No tracheal deviation.  Cardiovascular:     Rate and Rhythm: Normal rate.  Pulmonary:     Effort: Pulmonary effort is normal.  Abdominal:     Palpations: Abdomen is soft.  Musculoskeletal:     Cervical back: No rigidity.  Skin:    General: Skin is warm and dry.  Neurological:     Mental Status: She is alert and oriented to person, place, and time.  Psychiatric:        Behavior: Behavior normal.     Ortho  Exam patient will heel and toe walk no sciatic notch tenderness trochanteric bursa is nontender.  No nerve root tension signs.  Specialty Comments:  No specialty comments available.  Imaging: No results found.   PMFS History: Patient Active Problem List   Diagnosis Date Noted   Change in bowel function 02/24/2022   Age-related osteoporosis without current pathological fracture 01/20/2022   Melena 12/29/2021   Rectal pain 06/24/2021   Low back pain 03/25/2021   CAD (coronary artery disease) 09/19/2020   Severe mitral insufficiency 09/18/2020   Mitral valve prolapse 09/18/2020   Nonrheumatic mitral valve regurgitation 09/18/2020   Constipation 06/20/2019   Osteoarthritis of knees, bilateral 05/23/2019   Odynophagia 10/02/2018   Observation after surgery 10/02/2018   Esophageal dysphagia    Gastroesophageal reflux disease    Chronic cholecystitis with calculus s/p lap cholecystectomy  05/12/2016 05/12/2016   History of bleeding ulcers    Multiple thyroid nodules    Seasonal allergies    Lower abdominal pain 01/18/2016   Past Medical History:  Diagnosis Date   Anemia    Asthma    CAD (coronary artery disease) 09/19/2020   Complication of anesthesia    "drop in blood pressure years ago after hysterectomy "    Depression    Dyspnea    Dysrhythmia    History of bleeding ulcers    Hypercholesteremia    Mitral valve prolapse    Multiple thyroid nodules    Osteoarthritis of knees, bilateral 05/23/2019   Sarcoidosis    Seasonal allergies     Family History  Problem Relation Age of Onset   Colon cancer Other    Breast cancer Mother    Tuberculosis Mother    Heart disease Father     Past Surgical History:  Procedure Laterality Date   ABDOMINAL HYSTERECTOMY     complete.    BIOPSY  10/02/2018   Procedure: BIOPSY;  Surgeon: Corbin Ade, MD;  [facility];  Service: Endoscopy;;  gastric   bleeding ulcer     CATARACT EXTRACTION W/PHACO Left 06/11/2019   Procedure: CATARACT EXTRACTION PHACO AND INTRAOCULAR LENS PLACEMENT (IOC) CDE: 4.63;  Surgeon: Fabio Pierce, MD;  Location: [facility];  Service: Ophthalmology;  Laterality: Left;   CATARACT EXTRACTION W/PHACO Right 06/25/2019   Procedure: CATARACT EXTRACTION PHACO AND INTRAOCULAR LENS PLACEMENT RIGHT EYE;  Surgeon: Fabio Pierce, MD;  Location: [facility];  Service: Ophthalmology;  Laterality: Right;  CDE: 5.61   CHOLECYSTECTOMY     COLONOSCOPY N/A 12/18/2015   Rourk: diverticulosis in sigmoid colon, otherwise normal    COLONOSCOPY WITH PROPOFOL N/A 03/17/2022   Procedure: COLONOSCOPY WITH PROPOFOL;  Surgeon: Corbin Ade, MD;  [facility];  Service: Endoscopy;  Laterality: N/A;  9:00 am   Cyst removed from ovary  1973   dental implants     ESOPHAGOGASTRODUODENOSCOPY (EGD) WITH PROPOFOL N/A 10/02/2018   Mild Schatzki ring status post dilation, small hiatal hernia, bleeding erosive  gastropathy, gastritis on bx, no h pylori.  Procedure: ESOPHAGOGASTRODUODENOSCOPY (EGD) WITH PROPOFOL;  Surgeon: Corbin Ade, MD;  [facility];  Service: Endoscopy;  Laterality: N/A;  12:45pm   LAPAROSCOPIC CHOLECYSTECTOMY SINGLE SITE WITH INTRAOPERATIVE CHOLANGIOGRAM N/A 05/12/2016   Procedure: LAPAROSCOPIC CHOLECYSTECTOMY SINGLE SITE WITH INTRAOPERATIVE CHOLANGIOGRAM;  Surgeon: Karie Soda, MD;  Location: [facility];  Service: General;  Laterality: N/A;   left hammer toe repair     MALONEY DILATION N/A 10/02/2018   Procedure: Elease Hashimoto DILATION;  Surgeon: Jena Gauss,  Gerrit Friends, MD;  [facility];  Service: Endoscopy;  Laterality: N/A;   MITRAL VALVE REPLACEMENT     RIGHT/LEFT HEART CATH AND CORONARY ANGIOGRAPHY N/A 09/18/2020   Procedure: RIGHT/LEFT HEART CATH AND CORONARY ANGIOGRAPHY;  Surgeon: Swaziland, Peter M, MD;  Location: [facility] INVASIVE CV LAB;  Service: Cardiovascular;  Laterality: N/A;   TEE WITHOUT CARDIOVERSION N/A 09/18/2020   Procedure: TRANSESOPHAGEAL ECHOCARDIOGRAM (TEE);  Surgeon: Wendall Stade, MD;  Location: [facility] ENDOSCOPY;  Service: Cardiovascular;  Laterality: N/A;   thyroid needle biopsy     VIDEO BRONCHOSCOPY Bilateral 03/21/2017   Did not have done   Social History   Occupational History   Not on file  Tobacco Use   Smoking status: Never    Passive exposure: Past   Smokeless tobacco: Never  Vaping Use   Vaping status: Never Used  Substance and Sexual Activity   Alcohol use: No   Drug use: No   Sexual activity: Not Currently    Birth control/protection: None

## 2023-01-17 ENCOUNTER — Ambulatory Visit: Payer: Medicare HMO | Admitting: Neurology

## 2023-01-19 DIAGNOSIS — H9212 Otorrhea, left ear: Secondary | ICD-10-CM | POA: Diagnosis not present

## 2023-02-14 ENCOUNTER — Ambulatory Visit: Payer: Medicare HMO | Admitting: Urology

## 2023-02-14 VITALS — BP 123/68 | HR 102

## 2023-02-14 DIAGNOSIS — N3281 Overactive bladder: Secondary | ICD-10-CM | POA: Diagnosis not present

## 2023-02-14 DIAGNOSIS — N3000 Acute cystitis without hematuria: Secondary | ICD-10-CM | POA: Diagnosis not present

## 2023-02-14 LAB — URINALYSIS, ROUTINE W REFLEX MICROSCOPIC
Bilirubin, UA: NEGATIVE
Glucose, UA: NEGATIVE
Ketones, UA: NEGATIVE
Nitrite, UA: NEGATIVE
Protein,UA: NEGATIVE
RBC, UA: NEGATIVE
Specific Gravity, UA: 1.015 (ref 1.005–1.030)
Urobilinogen, Ur: 0.2 mg/dL (ref 0.2–1.0)
pH, UA: 6 (ref 5.0–7.5)

## 2023-02-14 LAB — MICROSCOPIC EXAMINATION: Bacteria, UA: NONE SEEN

## 2023-02-14 MED ORDER — MYRBETRIQ 25 MG PO TB24: 25.0000 mg | ORAL_TABLET | Freq: Every day | ORAL | 11 refills | Status: DC

## 2023-02-14 NOTE — Patient Instructions (Signed)
 Overactive Bladder, Adult  Overactive bladder is a condition in which a person has a sudden and frequent need to urinate. A person might also leak urine if he or she cannot get to the bathroom fast enough (urinary incontinence). Sometimes, symptoms can interfere with work or social activities. What are the causes? Overactive bladder is associated with poor nerve signals between your bladder and your brain. Your bladder may get the signal to empty before it is full. You may also have very sensitive muscles that make your bladder squeeze too soon. This condition may also be caused by other factors, such as: Medical conditions: Urinary tract infection. Infection of nearby tissues. Prostate enlargement. Bladder stones, inflammation, or tumors. Diabetes. Muscle or nerve weakness, especially from these conditions: A spinal cord injury. Stroke. Multiple sclerosis. Parkinson's disease. Other causes: Surgery on the uterus or urethra. Drinking too much caffeine or alcohol. Certain medicines, especially those that eliminate extra fluid in the body (diuretics). Constipation. What increases the risk? You may be at greater risk for overactive bladder if you: Are an older adult. Smoke. Are going through menopause. Have prostate problems. Have a neurological disease, such as stroke, dementia, Parkinson's disease, or multiple sclerosis (MS). Eat or drink alcohol, spicy food, caffeine, and other things that irritate the bladder. Are overweight or obese. What are the signs or symptoms? Symptoms of this condition include a sudden, strong urge to urinate. Other symptoms include: Leaking urine. Urinating 8 or more times a day. Waking up to urinate 2 or more times overnight. How is this diagnosed? This condition may be diagnosed based on: Your symptoms and medical history. A physical exam. Blood or urine tests to check for possible causes, such as infection. You may also need to see a health care  provider who specializes in urinary tract problems. This is called a urologist. How is this treated? Treatment for overactive bladder depends on the cause of your condition and whether it is mild or severe. Treatment may include: Bladder training, such as: Learning to control the urge to urinate by following a schedule to urinate at regular intervals. Doing Kegel exercises to strengthen the pelvic floor muscles that support your bladder. Special devices, such as: Biofeedback. This uses sensors to help you become aware of your body's signals. Electrical stimulation. This uses electrodes placed inside the body (implanted) or outside the body. These electrodes send gentle pulses of electricity to strengthen the nerves or muscles that control the bladder. Women may use a plastic device, called a pessary, that fits into the vagina and supports the bladder. Medicines, such as: Antibiotics to treat bladder infection. Antispasmodics to stop the bladder from releasing urine at the wrong time. Tricyclic antidepressants to relax bladder muscles. Injections of botulinum toxin type A directly into the bladder tissue to relax bladder muscles. Surgery, such as: A device may be implanted to help manage the nerve signals that control urination. An electrode may be implanted to stimulate electrical signals in the bladder. A procedure may be done to change the shape of the bladder. This is done only in very severe cases. Follow these instructions at home: Eating and drinking  Make diet or lifestyle changes recommended by your health care provider. These may include: Drinking fluids throughout the day and not only with meals. Cutting down on caffeine or alcohol. Eating a healthy and balanced diet to prevent constipation. This may include: Choosing foods that are high in fiber, such as beans, whole grains, and fresh fruits and vegetables. Limiting foods that  are high in fat and processed sugars, such as fried  and sweet foods. Lifestyle  Lose weight if needed. Do not use any products that contain nicotine or tobacco. These include cigarettes, chewing tobacco, and vaping devices, such as e-cigarettes. If you need help quitting, ask your health care provider. General instructions Take over-the-counter and prescription medicines only as told by your health care provider. If you were prescribed an antibiotic medicine, take it as told by your health care provider. Do not stop taking the antibiotic even if you start to feel better. Use any implants or pessary as told by your health care provider. If needed, wear pads to absorb urine leakage. Keep a log to track how much and when you drink, and when you need to urinate. This will help your health care provider monitor your condition. Keep all follow-up visits. This is important. Contact a health care provider if: You have a fever or chills. Your symptoms do not get better with treatment. Your pain and discomfort get worse. You have more frequent urges to urinate. Get help right away if: You are not able to control your bladder. Summary Overactive bladder refers to a condition in which a person has a sudden and frequent need to urinate. Several conditions may lead to an overactive bladder. Treatment for overactive bladder depends on the cause and severity of your condition. Making lifestyle changes, doing Kegel exercises, keeping a log, and taking medicines can help with this condition. This information is not intended to replace advice given to you by your health care provider. Make sure you discuss any questions you have with your health care provider. Document Revised: 10/08/2019 Document Reviewed: 10/08/2019 Elsevier Patient Education  2024 [employer] Hematuria, Adult Hematuria is blood in the urine. Blood may be visible in the urine, or it may be identified with a test. This condition can be caused by infections of the bladder, urethra,  kidney, or prostate. Other possible causes include: Kidney stones. Cancer of the urinary tract. Too much calcium  in the urine. Conditions that are passed from parent to child (inherited conditions). Exercise that requires a lot of energy. Infections can usually be treated with medicine, and a kidney stone usually will pass through your urine. If neither of these is the cause of your hematuria, more tests may be needed to identify the cause of your symptoms. It is very important to tell your health care provider about any blood in your urine, even if it is painless or the blood stops without treatment. Blood in the urine, when it happens and then stops and then happens again, can be a symptom of a very serious condition, including cancer. There is no pain in the initial stages of many urinary cancers. Follow these instructions at home: Medicines Take over-the-counter and prescription medicines only as told by your health care provider. If you were prescribed an antibiotic medicine, take it as told by your health care provider. Do not stop taking the antibiotic even if you start to feel better. Eating and drinking Drink enough fluid to keep your urine pale yellow. It is recommended that you drink 3-4 quarts (2.8-3.8 L) a day. If you have been diagnosed with an infection, drinking cranberry juice in addition to large amounts of water  is recommended. Avoid caffeine, tea, and carbonated beverages. These tend to irritate the bladder. Avoid alcohol because it may irritate the prostate (in males). General instructions If you have been diagnosed with a kidney stone, follow your health care provider's instructions  about straining your urine to catch the stone. Empty your bladder often. Avoid holding urine for long periods of time. If you are female: After a bowel movement, wipe from front to back and use each piece of toilet paper only once. Empty your bladder before and after sex. Pay attention to any  changes in your symptoms. Tell your health care provider about any changes or any new symptoms. It is up to you to get the results of any tests. Ask your health care provider, or the department that is doing the test, when your results will be ready. Keep all follow-up visits. This is important. Contact a health care provider if: You develop back pain. You have a fever or chills. You have nausea or vomiting. Your symptoms do not improve after 3 days. Your symptoms get worse. Get help right away if: You develop severe vomiting and are unable to take medicine without vomiting. You develop severe pain in your back or abdomen even though you are taking medicine. You pass a large amount of blood in your urine. You pass blood clots in your urine. You feel very weak or like you might faint. You faint. Summary Hematuria is blood in the urine. It has many possible causes. It is very important that you tell your health care provider about any blood in your urine, even if it is painless or the blood stops without treatment. Take over-the-counter and prescription medicines only as told by your health care provider. Drink enough fluid to keep your urine pale yellow. This information is not intended to replace advice given to you by your health care provider. Make sure you discuss any questions you have with your health care provider. Document Revised: 09/19/2019 Document Reviewed: 09/19/2019 Elsevier Patient Education  2024 [employer].

## 2023-02-14 NOTE — Progress Notes (Signed)
 02/14/2023 11:17 AM   Rollo FORBES Burkes July 07, 1945 [id number]  Referring provider: Trudy Burkes FALCON, MD [address] [place],  [place] [address]  No chief complaint on file.   HPI: Ms Greening is a 77yo here for followup for microhematuria and OAB. UA today shows no RBCs. She has 6-10 WBCs. She has noted her urine has a strong odor. She uses mirabegron  25mg  with good results   PMH: Past Medical History:  Diagnosis Date   Anemia    Asthma    CAD (coronary artery disease) 09/19/2020   Complication of anesthesia    drop in blood pressure years ago after hysterectomy     Depression    Dyspnea    Dysrhythmia    History of bleeding ulcers    Hypercholesteremia    Mitral valve prolapse    Multiple thyroid  nodules    Osteoarthritis of knees, bilateral 05/23/2019   Sarcoidosis    Seasonal allergies     Surgical History: Past Surgical History:  Procedure Laterality Date   ABDOMINAL HYSTERECTOMY     complete.    BIOPSY  10/02/2018   Procedure: BIOPSY;  Surgeon: Shaaron Lamar HERO, MD;  Location: [facility];  Service: Endoscopy;;  gastric   bleeding ulcer     CATARACT EXTRACTION W/PHACO Left 06/11/2019   Procedure: CATARACT EXTRACTION PHACO AND INTRAOCULAR LENS PLACEMENT (IOC) CDE: 4.63;  Surgeon: Harrie Agent, MD;  Location: [facility];  Service: Ophthalmology;  Laterality: Left;   CATARACT EXTRACTION W/PHACO Right 06/25/2019   Procedure: CATARACT EXTRACTION PHACO AND INTRAOCULAR LENS PLACEMENT RIGHT EYE;  Surgeon: Harrie Agent, MD;  Location: [facility];  Service: Ophthalmology;  Laterality: Right;  CDE: 5.61   CHOLECYSTECTOMY     COLONOSCOPY N/A 12/18/2015   Rourk: diverticulosis in sigmoid colon, otherwise normal    COLONOSCOPY WITH PROPOFOL  N/A 03/17/2022   Procedure: COLONOSCOPY WITH PROPOFOL ;  Surgeon: Shaaron Lamar HERO, MD;  Location: [facility];  Service: Endoscopy;  Laterality: N/A;  9:00 am   Cyst removed from ovary  1973   dental implants      ESOPHAGOGASTRODUODENOSCOPY (EGD) WITH PROPOFOL  N/A 10/02/2018   Mild Schatzki ring status post dilation, small hiatal hernia, bleeding erosive gastropathy, gastritis on bx, no h pylori.  Procedure: ESOPHAGOGASTRODUODENOSCOPY (EGD) WITH PROPOFOL ;  Surgeon: Shaaron Lamar HERO, MD;  Location: [facility];  Service: Endoscopy;  Laterality: N/A;  12:45pm   LAPAROSCOPIC CHOLECYSTECTOMY SINGLE SITE WITH INTRAOPERATIVE CHOLANGIOGRAM N/A 05/12/2016   Procedure: LAPAROSCOPIC CHOLECYSTECTOMY SINGLE SITE WITH INTRAOPERATIVE CHOLANGIOGRAM;  Surgeon: Elspeth Schultze, MD;  Location: [facility];  Service: General;  Laterality: N/A;   left hammer toe repair     MALONEY DILATION N/A 10/02/2018   Procedure: AGAPITO DILATION;  Surgeon: Shaaron Lamar HERO, MD;  Location: [facility];  Service: Endoscopy;  Laterality: N/A;   MITRAL VALVE REPLACEMENT     RIGHT/LEFT HEART CATH AND CORONARY ANGIOGRAPHY N/A 09/18/2020   Procedure: RIGHT/LEFT HEART CATH AND CORONARY ANGIOGRAPHY;  Surgeon: Jordan, Peter M, MD;  Location: [facility];  Service: Cardiovascular;  Laterality: N/A;   TEE WITHOUT CARDIOVERSION N/A 09/18/2020   Procedure: TRANSESOPHAGEAL ECHOCARDIOGRAM (TEE);  Surgeon: Delford Maude BROCKS, MD;  Location: [facility];  Service: Cardiovascular;  Laterality: N/A;   thyroid  needle biopsy     VIDEO BRONCHOSCOPY Bilateral 03/21/2017   Did not have done    Home Medications:  Allergies as of 02/14/2023       Reactions   Other Anaphylaxis   Cigarette smoke  and/or strong chemical smells make my throat start to close        Medication List        Accurate as of February 14, 2023 11:17 AM. If you have any questions, ask your nurse or doctor.          albuterol  108 (90 Base) MCG/ACT inhaler Commonly known as: VENTOLIN  HFA Inhale 1-2 puffs into the lungs every 6 (six) hours as needed for wheezing or shortness of breath.   aspirin  EC 81 MG tablet Take 1 tablet (81 mg total) by mouth daily. Swallow whole.    atorvastatin  20 MG tablet Commonly known as: LIPITOR Take 20 mg by mouth at bedtime.   CALCIUM -MAGNESIUM-ZINC PO Take 1 tablet by mouth in the morning, at noon, and at bedtime.   cetirizine  10 MG tablet Commonly known as: ZYRTEC  Take 10 mg by mouth at bedtime.   hydrocortisone  2.5 % rectal cream Commonly known as: ANUSOL -HC Place 1 Application rectally 2 (two) times daily. What changed:  when to take this reasons to take this   linaclotide  72 MCG capsule Commonly known as: Linzess  Take 1 capsule (72 mcg total) by mouth daily before breakfast.   metoprolol  succinate 25 MG 24 hr tablet Commonly known as: Toprol  XL Take 1 tablet (25 mg total) by mouth daily.   Myrbetriq  25 MG Tb24 tablet Generic drug: mirabegron  ER Take 25 mg by mouth daily.   pantoprazole  40 MG tablet Commonly known as: PROTONIX  Take 1 tablet (40 mg total) by mouth daily.   Vitamin D3 125 MCG (5000 UT) Tabs Take 5,000 Units by mouth every other day.   zolpidem  5 MG tablet Commonly known as: AMBIEN  Take 5 mg by mouth at bedtime.        Allergies:  Allergies  Allergen Reactions   Other Anaphylaxis    Cigarette smoke and/or strong chemical smells make my throat start to close    Family History: Family History  Problem Relation Age of Onset   Colon cancer Other    Breast cancer Mother    Tuberculosis Mother    Heart disease Father     Social History:  reports that she has never smoked. She has been exposed to tobacco smoke. She has never used smokeless tobacco. She reports that she does not drink alcohol and does not use drugs.  ROS: All other review of systems were reviewed and are negative except what is noted above in HPI  Physical Exam: BP 123/68   Pulse (!) 102   Constitutional:  Alert and oriented, No acute distress. HEENT: [place] AT, moist mucus membranes.  Trachea midline, no masses. Cardiovascular: No clubbing, cyanosis, or edema. Respiratory: Normal respiratory effort, no  increased work of breathing. GI: Abdomen is soft, nontender, nondistended, no abdominal masses GU: No CVA tenderness.  Lymph: No cervical or inguinal lymphadenopathy. Skin: No rashes, bruises or suspicious lesions. Neurologic: Grossly intact, no focal deficits, moving all 4 extremities. Psychiatric: Normal mood and affect.  Laboratory Data: Lab Results  Component Value Date   WBC 7.0 12/30/2021   HGB 14.7 12/30/2021   HCT 44.2 12/30/2021   MCV 92.5 12/30/2021   PLT 287 12/30/2021    Lab Results  Component Value Date   CREATININE 0.90 07/27/2022    No results found for: PSA  No results found for: TESTOSTERONE  Lab Results  Component Value Date   HGBA1C 5.5 02/03/2017    Urinalysis    Component Value Date/Time   COLORURINE STRAW (A) 09/07/2018  9090   APPEARANCEUR Clear 08/09/2022 1155   LABSPEC 1.003 (L) 09/07/2018 0909   PHURINE 6.0 09/07/2018 0909   GLUCOSEU Negative 08/09/2022 1155   HGBUR NEGATIVE 09/07/2018 0909   BILIRUBINUR Negative 08/09/2022 1155   KETONESUR NEGATIVE 09/07/2018 0909   PROTEINUR Negative 08/09/2022 1155   PROTEINUR NEGATIVE 09/07/2018 0909   NITRITE Negative 08/09/2022 1155   NITRITE NEGATIVE 09/07/2018 0909   LEUKOCYTESUR 1+ (A) 08/09/2022 1155   LEUKOCYTESUR NEGATIVE 09/07/2018 0909    Lab Results  Component Value Date   LABMICR See below: 08/09/2022   WBCUA 0-5 08/09/2022   LABEPIT 0-10 08/09/2022   BACTERIA None seen 08/09/2022    Pertinent Imaging:  No results found for this or any previous visit.  No results found for this or any previous visit.  No results found for this or any previous visit.  No results found for this or any previous visit.  Results for orders placed during the hospital encounter of 07/18/17  US  RENAL  Narrative CLINICAL DATA:  78 year old female with congenital obstruction ureteral pelvic junction. Initial encounter.  EXAM: RENAL / URINARY TRACT ULTRASOUND COMPLETE  COMPARISON:   03/31/2015 CT.  FINDINGS: Right Kidney:  Length: 9.5 cm. Under rotation right kidney. Fullness right renal collecting system similar to prior CT suggestive of mild right ureteral pelvic junction obstruction.  Left Kidney:  Length: 9.6 cm. Echogenicity within normal limits. No mass or hydronephrosis visualized.  Bladder:  Appears normal for degree of bladder distention.  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 Findings suggestive of mild right ureteral pelvic junction obstruction. Appearance similar to the prior CT.  Otherwise negative exam.   Electronically Signed By: Elspeth Slain M.D. On: 07/18/2017 11:03  No results found for this or any previous visit.  Results for orders placed in visit on 06/16/22  CT HEMATURIA WORKUP  Narrative CLINICAL DATA:  microhematuria  EXAM: CT ABDOMEN AND PELVIS WITHOUT AND WITH CONTRAST  TECHNIQUE: Multidetector CT imaging of the abdomen and pelvis was performed following the standard protocol before and following the bolus administration of intravenous contrast.  RADIATION DOSE REDUCTION: This exam was performed according to the departmental dose-optimization program which includes automated exposure control, adjustment of the mA and/or kV according to patient size and/or use of iterative reconstruction technique.  CONTRAST:  OMNIPAQUE  IOHEXOL  300 MG/ML  SOLN  COMPARISON:  CT scan abdomen and pelvis from 09/07/2018.  FINDINGS: Lower chest: There are patchy atelectatic changes in the visualized lung bases. No overt consolidation. No pleural effusion. The heart is normal in size. No pericardial effusion.  Hepatobiliary: The liver is normal in size. Non-cirrhotic configuration. No suspicious mass. These is mild diffuse hepatic steatosis. No intrahepatic bile duct dilation. There is mild prominence of the extrahepatic bile duct, most likely due to post cholecystectomy status. Gallbladder is  surgically absent.  Pancreas: Unremarkable. No pancreatic ductal dilatation or surrounding inflammatory changes.  Spleen: Within normal limits. No focal lesion.  Adrenals/Urinary Tract: Adrenal glands are unremarkable. 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 No suspicious renal mass. No hydronephrosis. No renal or ureteric calculi. Urinary bladder is under distended, precluding optimal assessment. However, no large mass or stones identified. There is mild irregular wall thickening, which may be due to underdistention or due to chronic cystitis. Correlate clinically and with urinalysis. No perivesical fat stranding.  Stomach/Bowel: No disproportionate dilation of the small or large bowel loops. No evidence of abnormal bowel wall thickening or inflammatory changes. The appendix is  unremarkable. There are at least 2 diverticuli arising from the duodenum. There is a tiny hiatal hernia.  Vascular/Lymphatic: No ascites or pneumoperitoneum. No abdominal or pelvic lymphadenopathy, by size criteria. No aneurysmal dilation of the major abdominal arteries. There are mild peripheral atherosclerotic vascular calcifications of the aorta and its major branches.  Reproductive: The uterus is surgically absent. No large adnexal mass.  Other: The visualized soft tissues and abdominal wall are unremarkable.  Musculoskeletal: No suspicious osseous lesions. There are mild - moderate multilevel degenerative changes in the visualized spine.  IMPRESSION: 1. No nephroureterolithiasis or obstructive uropathy. No suspicious renal mass. 2. Mild irregular wall thickening of the urinary bladder, which may be due to underdistention or due to chronic cystitis. Correlate clinically and with urinalysis. 3. Multiple other nonacute observations, as described above.  Aortic Atherosclerosis (ICD10-I70.0).   Electronically Signed By: Ree Molt M.D. On: 08/09/2022 09:06  No  results found for this or any previous visit.   Assessment & Plan:    1. OAB (overactive bladder) (Primary) Mirabegron  25mg  daily - Urinalysis, Routine w reflex microscopic  2. Acute cystitis without hematuria Urine for culture - Urine Culture   No follow-ups on file.  Belvie Clara, MD  [facility] [place]

## 2023-02-17 ENCOUNTER — Encounter: Payer: Self-pay | Admitting: Urology

## 2023-02-18 ENCOUNTER — Telehealth: Payer: Self-pay

## 2023-02-18 LAB — URINE CULTURE

## 2023-02-18 MED ORDER — DOXYCYCLINE HYCLATE 100 MG PO CAPS: 100.0000 mg | ORAL_CAPSULE | Freq: Two times a day (BID) | ORAL | 0 refills | Status: DC

## 2023-02-18 NOTE — Telephone Encounter (Signed)
Patient called and made aware of positive urine culture and doxycycline sent to pharmacy.

## 2023-02-24 ENCOUNTER — Telehealth: Payer: Self-pay

## 2023-02-24 DIAGNOSIS — K219 Gastro-esophageal reflux disease without esophagitis: Secondary | ICD-10-CM

## 2023-02-24 NOTE — Telephone Encounter (Signed)
Pt called requesting a refill on pantoprazole. Pt is requesting that it go to [facility], pt was last seen on 02/24/2022.

## 2023-02-25 MED ORDER — PANTOPRAZOLE SODIUM 40 MG PO TBEC: 40.0000 mg | DELAYED_RELEASE_TABLET | Freq: Every day | ORAL | 3 refills | Status: DC

## 2023-02-25 NOTE — Addendum Note (Signed)
Addended by: Tiffany Kocher on: 02/25/2023 08:53 AM   Modules accepted: Orders

## 2023-04-09 ENCOUNTER — Other Ambulatory Visit: Payer: Self-pay | Admitting: Gastroenterology

## 2023-04-20 ENCOUNTER — Encounter: Payer: Self-pay | Admitting: Neurology

## 2023-04-21 ENCOUNTER — Ambulatory Visit: Payer: Medicare HMO | Admitting: Neurology

## 2023-04-21 ENCOUNTER — Encounter: Payer: Self-pay | Admitting: Neurology

## 2023-04-21 VITALS — BP 118/76 | HR 71 | Ht 63.5 in | Wt 138.0 lb

## 2023-04-21 DIAGNOSIS — R519 Headache, unspecified: Secondary | ICD-10-CM | POA: Diagnosis not present

## 2023-04-21 NOTE — Progress Notes (Signed)
 Chief Complaint  Patient presents with   New Patient (Initial Visit)    Pt in 14, here alone Pt is here for headaches.       ASSESSMENT AND PLAN  Amber Bird is a 78 y.o. female   Transient headaches  No jaw claudication, no visual changes,  MRI of the brain, MRA of the brain and neck showed no significant abnormality  Reported long history of headache when she was young, likely underlying migraine  Continue follow-up with primary care   DIAGNOSTIC DATA (LABS, IMAGING, TESTING) - I reviewed patient records, labs, notes, testing and imaging myself where available.   MEDICAL HISTORY:  Amber Bird, is a 78 year old female seen in request by her primary care from [facility] Dr. Mitzi Hansen for evaluation of headache, interpretation of the MRI report initial evaluation April 21, 2023  History is obtained from the patient and review of electronic medical records. I personally reviewed pertinent available imaging films in PACS.   PMHx of  Hypertension GERD Hyperlipidemia Mitral valve prolapse, Repaired at [facility] on Dec 01 2020, presented with chest pain, developed  afib with RVR on Nov 8th,  She had a history of mitral valve repair for mitral valve prolapse on October 01, 2020 at [facility], prior to the procedure, she presented with chest pain, post procedure she developed transient atrial fibrillation on December 09, 2020, required transient anticoagulation and amiodarone for few months, asymptomatic since then  Around April 2024, she had few days of headache, she is new to her current primary care Dr. Particia Nearing, ordered MRI of the brain, MRA of brain and neck showed no significant abnormality  She no longer have a headache, no jaw claudication visual changes, no lateralized motor or sensory deficit  She reports a long history of intermittent headaches since age 49s, behind eye pressure, rarely happens now   MRI brain in April 2024 1. No acute  intracranial pathology or other finding to explain the  patient's headaches. Mild for age chronic small-vessel ischemic  change.  2. If there is concern for aneurysm, MRA is recommended as aneurysm  can not be excluded on these sequences.   MRA of head in May 2024 Motion degraded head MRA. No evidence of an intracranial aneurysm or  large vessel occlusion.   MRA of neck in May 2024 Patent cervical carotid and vertebral arteries. Assessment of  portions of the common carotid arteries and vertebral arteries is  limited by artifact as detailed above. No evidence of a significant  stenosis or dissection elsewhere.   PHYSICAL EXAM:   Vitals:   04/21/23 0929  BP: 118/76  Pulse: 71  Weight: 138 lb (62.6 kg)  Height: 5' 3.5" (1.613 m)   Not recorded     Body mass index is 24.06 kg/m.  PHYSICAL EXAMNIATION:  Gen: NAD, conversant, well nourised, well groomed                     Cardiovascular: Regular rate rhythm, no peripheral edema, warm, nontender. Eyes: Conjunctivae clear without exudates or hemorrhage Neck: Supple, no carotid bruits. Pulmonary: Clear to auscultation bilaterally   NEUROLOGICAL EXAM:  MENTAL STATUS: Speech/cognition: Awake, alert, oriented to history taking and casual conversation CRANIAL NERVES: CN II: Visual fields are full to confrontation. Pupils are round equal and briskly reactive to light. CN III, IV, VI: extraocular movement are normal. No ptosis. CN V: Facial sensation is intact to light touch CN VII: Face  is symmetric with normal eye closure  CN VIII: Hearing is normal to causal conversation. CN IX, X: Phonation is normal. CN XI: Head turning and shoulder shrug are intact  MOTOR: There is no pronator drift of out-stretched arms. Muscle bulk and tone are normal. Muscle strength is normal.  REFLEXES: Reflexes are 2+ and symmetric at the biceps, triceps, knees, and ankles. Plantar responses are flexor.  SENSORY: Intact to light touch,  pinprick and vibratory sensation are intact in fingers and toes.  COORDINATION: There is no trunk or limb dysmetria noted.  GAIT/STANCE: Posture is normal. Gait is steady with normal steps, base, arm swing, and turning. Heel and toe walking are normal. Tandem gait is normal.  Romberg is absent.  REVIEW OF SYSTEMS:  Full 14 system review of systems performed and notable only for as above All other review of systems were negative.   ALLERGIES: Allergies  Allergen Reactions   Other Anaphylaxis    "Cigarette smoke and/or strong chemical smells make my throat start to close"    HOME MEDICATIONS: Current Outpatient Medications  Medication Sig Dispense Refill   albuterol (VENTOLIN HFA) 108 (90 Base) MCG/ACT inhaler Inhale 1-2 puffs into the lungs every 6 (six) hours as needed for wheezing or shortness of breath.     aspirin EC 81 MG tablet Take 1 tablet (81 mg total) by mouth daily. Swallow whole. 90 tablet 3   atorvastatin (LIPITOR) 20 MG tablet Take 20 mg by mouth at bedtime.     CALCIUM-MAGNESIUM-ZINC PO Take 1 tablet by mouth in the morning, at noon, and at bedtime.     cetirizine (ZYRTEC) 10 MG tablet Take 10 mg by mouth at bedtime.     Cholecalciferol (VITAMIN D3) 125 MCG (5000 UT) TABS Take 5,000 Units by mouth every other day.     colchicine 0.6 MG tablet Take by mouth.     Durapatite, Hydroxyapatite, POWD Take by mouth.     linaclotide (LINZESS) 72 MCG capsule TAKE 1 CAPSULE BY MOUTH ONCE DAILY BEFORE  BREAKFAST 90 capsule 1   MAGNESIUM PO Take by mouth.     metoprolol succinate (TOPROL XL) 25 MG 24 hr tablet Take 1 tablet (25 mg total) by mouth daily. 90 tablet 3   metoprolol tartrate (LOPRESSOR) 25 MG tablet Take by mouth.     MYRBETRIQ 25 MG TB24 tablet Take 1 tablet (25 mg total) by mouth daily. 30 tablet 11   ofloxacin (FLOXIN) 0.3 % OTIC solution 5 drops to left ear twice daily.     pantoprazole (PROTONIX) 40 MG tablet Take 1 tablet (40 mg total) by mouth daily before  breakfast. 90 tablet 3   zolpidem (AMBIEN) 10 MG tablet Take 10 mg by mouth at bedtime as needed for sleep.     No current facility-administered medications for this visit.    PAST MEDICAL HISTORY: Past Medical History:  Diagnosis Date   Anemia    Anxiety 11/28/2020   Asthma    CAD (coronary artery disease) 09/19/2020   Chronic daily headache 06/06/2014   Complication of anesthesia    "drop in blood pressure years ago after hysterectomy "    Depression    Dyspnea    Dysrhythmia    History of bleeding ulcers    Hypercholesteremia    Mitral valve prolapse    Multiple thyroid nodules    Osteoarthritis of knees, bilateral 05/23/2019   Sarcoidosis    Seasonal allergies     PAST SURGICAL HISTORY: Past Surgical History:  Procedure Laterality Date   ABDOMINAL HYSTERECTOMY     complete.    BIOPSY  10/02/2018   Procedure: BIOPSY;  Surgeon: Corbin Ade, MD;  [facility]: [facility];  Service: Endoscopy;;  gastric   bleeding ulcer     CATARACT EXTRACTION W/PHACO Left 06/11/2019   Procedure: CATARACT EXTRACTION PHACO AND INTRAOCULAR LENS PLACEMENT (IOC) CDE: 4.63;  Surgeon: Fabio Pierce, MD;  Location: [facility];  Service: Ophthalmology;  Laterality: Left;   CATARACT EXTRACTION W/PHACO Right 06/25/2019   Procedure: CATARACT EXTRACTION PHACO AND INTRAOCULAR LENS PLACEMENT RIGHT EYE;  Surgeon: Fabio Pierce, MD;  Location: [facility];  Service: Ophthalmology;  Laterality: Right;  CDE: 5.61   CHOLECYSTECTOMY     COLONOSCOPY N/A 12/18/2015   Rourk: diverticulosis in sigmoid colon, otherwise normal    COLONOSCOPY WITH PROPOFOL N/A 03/17/2022   Procedure: COLONOSCOPY WITH PROPOFOL;  Surgeon: Corbin Ade, MD;  [facility]: [facility];  Service: Endoscopy;  Laterality: N/A;  9:00 am   Cyst removed from ovary  1973   dental implants     ESOPHAGOGASTRODUODENOSCOPY (EGD) WITH PROPOFOL N/A 10/02/2018   Mild Schatzki ring status post dilation, small hiatal hernia, bleeding erosive gastropathy,  gastritis on bx, no h pylori.  Procedure: ESOPHAGOGASTRODUODENOSCOPY (EGD) WITH PROPOFOL;  Surgeon: Corbin Ade, MD;  [facility]: [facility];  Service: Endoscopy;  Laterality: N/A;  12:45pm   LAPAROSCOPIC CHOLECYSTECTOMY SINGLE SITE WITH INTRAOPERATIVE CHOLANGIOGRAM N/A 05/12/2016   Procedure: LAPAROSCOPIC CHOLECYSTECTOMY SINGLE SITE WITH INTRAOPERATIVE CHOLANGIOGRAM;  Surgeon: Karie Soda, MD;  Location: [facility];  Service: General;  Laterality: N/A;   left hammer toe repair     MALONEY DILATION N/A 10/02/2018   Procedure: Elease Hashimoto DILATION;  Surgeon: Corbin Ade, MD;  Location: [facility];  Service: Endoscopy;  Laterality: N/A;   MITRAL VALVE REPLACEMENT     RIGHT/LEFT HEART CATH AND CORONARY ANGIOGRAPHY N/A 09/18/2020   Procedure: RIGHT/LEFT HEART CATH AND CORONARY ANGIOGRAPHY;  Surgeon: Swaziland, Peter M, MD;  Location: [facility] INVASIVE CV LAB;  Service: Cardiovascular;  Laterality: N/A;   TEE WITHOUT CARDIOVERSION N/A 09/18/2020   Procedure: TRANSESOPHAGEAL ECHOCARDIOGRAM (TEE);  Surgeon: Wendall Stade, MD;  Location: [facility] ENDOSCOPY;  Service: Cardiovascular;  Laterality: N/A;   thyroid needle biopsy     VIDEO BRONCHOSCOPY Bilateral 03/21/2017   Did not have done    FAMILY HISTORY: Family History  Problem Relation Age of Onset   Colon cancer Other    Breast cancer Mother    Tuberculosis Mother    Heart disease Father     SOCIAL HISTORY: Social History   Socioeconomic History   Marital status: Divorced    Spouse name: Not on file   Number of children: Not on file   Years of education: Not on file   Highest education level: Not on file  Occupational History   Not on file  Tobacco Use   Smoking status: Never    Passive exposure: Past   Smokeless tobacco: Never  Vaping Use   Vaping status: Never Used  Substance and Sexual Activity   Alcohol use: No   Drug use: No   Sexual activity: Not Currently    Birth control/protection: None  Other Topics Concern   Not on file   Social History Narrative   Moved from [facility] 2009. In 2008 when the economy dropped lost job. Reports that after lost job was hard to get another job.  Is not married and does not have children. Used to enjoy horses.  Reports that moved here b/c it was a lot cheaper and warmer. Had visited here for a horse event in 1990 so decided to move here. Lives in [place], [place]. Reports that made stupid financial decisions and is dealing with that at this time.    Social Drivers of Health   Financial Resource Strain: Medium Risk (08/29/2020)   Overall Financial Resource Strain (CARDIA)    Difficulty of Paying Living Expenses: Somewhat hard  Food Insecurity: No Food Insecurity (08/29/2020)   Hunger Vital Sign    Worried About Running Out of Food in the Last Year: Never true    Ran Out of Food in the Last Year: Never true  Transportation Needs: No Transportation Needs (12/10/2020)   Received from [facility]   PRAPARE - Transportation    In the past 12 months, has lack of transportation kept you from medical appointments or from getting medications?: No    Lack of Transportation (Non-Medical): No  Physical Activity: Not on file  Stress: Not on file  Social Connections: Not on file  Intimate Partner Violence: Not on file      Levert Feinstein, M.D. Ph.D.  [facility] [address], [address] [place], [place] [address] Ph: [phone] Fax: [phone]  CC:  Donetta Potts, MD [address] [place],  [place] [address]  Donetta Potts, MD

## 2023-04-22 ENCOUNTER — Telehealth: Payer: Self-pay | Admitting: Neurology

## 2023-04-22 NOTE — Telephone Encounter (Signed)
 Pt Is asking for a call from RN to discuss what if any changes have be made to her medications

## 2023-04-25 NOTE — Telephone Encounter (Signed)
Call to patient, no answer. Left message to call back.

## 2023-04-25 NOTE — Telephone Encounter (Signed)
 CALLED AND WENT OVER THE ASSESMENT AND PLAN W/PT:  ASSESSMENT AND PLAN   Amber Bird is a 78 y.o. female   Transient headaches             No jaw claudication, no visual changes,             MRI of the brain, MRA of the brain and neck showed no significant abnormality             Reported long history of headache when she was young, likely underlying migraine   Continue follow-up with primary care  PT VOICED GRATITUDE AND UNDERSTANDING.

## 2023-04-27 ENCOUNTER — Telehealth: Payer: Self-pay | Admitting: Internal Medicine

## 2023-04-27 NOTE — Telephone Encounter (Signed)
 Pt called in asking if someone can print out her last lab results from 10/19/23 and print out so she can come pick it up. Please advise.

## 2023-04-27 NOTE — Telephone Encounter (Signed)
 Spoke to pt- she is coming to pick up lab results from [place] office.

## 2023-05-02 ENCOUNTER — Telehealth: Payer: Self-pay | Admitting: Internal Medicine

## 2023-05-02 NOTE — Telephone Encounter (Signed)
 Spoke with pt over the phone and she stated she has not had any CP since Saturday. She was sitting on the couch when the "pinching" sensation occurred in her chest and the pain radiated up to her jaw and she felt like her jaw was "being squeezed". She took an additional aspirin. Same thing happened again about an hour later. Pt stated that each episode lasted a couple of minutes and then went away. Went over ED precautions with patient and explained that I would forward this information to Dr. Tenny Craw to seek advisement. Pt verbalized understanding of plan.

## 2023-05-02 NOTE — Telephone Encounter (Signed)
   Pt c/o of Chest Pain: STAT if active CP, including tightness, pressure, jaw pain, radiating pain to shoulder/upper arm/back, CP unrelieved by Nitro. Symptoms reported of SOB, nausea, vomiting, sweating.  1. Are you having CP right now? no    2. Are you experiencing any other symptoms (ex. SOB, nausea, vomiting, sweating)? felt like her heart was being pinched then pain moved up her neck into her jaw   3. Is your CP continuous or coming and going? Coming and going   4. Have you taken Nitroglycerin? no   5. How long have you been experiencing CP? Since sat     6. If NO CP at time of call then end call with telling Pt to call back or call 911 if Chest pain returns prior to return call from triage team.

## 2023-05-03 NOTE — Telephone Encounter (Signed)
 Per Dr Tenny Craw:    Thank you    No further testing    Pt advised and will continue to monitor.

## 2023-05-03 NOTE — Telephone Encounter (Addendum)
 I called the pt to see how she was doing.. she says that she has felt well since Saturday when she had the events.. she says she is not happy with her PCP since he has not done any labs since 10/2022.   Her last heart Cath was 09/2020.   She has declined any appts that I offered her...but she is asking if Dr Tenny Craw thinks that she needs any testing. She says that the symptoms were vague and she is not sure what precipitated the event on Saturday.    She has exercised since and has been feeling well with the exertion.   Pt says that her BP has been: 101/78, 103/62, 95/61..heart rate in 70's.  She says that she has been drinking 7-8 glasses of water a day.  She declines feeling dizzy, fatigue, no SOB.    Pt will call and let us know if anything changes/ worsens or if her symptoms represent.  I spent 25 min with the pt on the phone.

## 2023-05-10 DIAGNOSIS — R5383 Other fatigue: Secondary | ICD-10-CM | POA: Diagnosis not present

## 2023-05-10 DIAGNOSIS — Z131 Encounter for screening for diabetes mellitus: Secondary | ICD-10-CM | POA: Diagnosis not present

## 2023-05-10 DIAGNOSIS — K219 Gastro-esophageal reflux disease without esophagitis: Secondary | ICD-10-CM | POA: Diagnosis not present

## 2023-06-10 ENCOUNTER — Telehealth: Payer: Self-pay | Admitting: Urology

## 2023-06-10 NOTE — Telephone Encounter (Signed)
 Wants to know if it is ok to keep taking Myrbetriq . She looked on the internet and it said it can cause kidney problems.

## 2023-06-13 NOTE — Telephone Encounter (Signed)
 Called pt back per MD okay to continue myrbetriq 

## 2023-06-13 NOTE — Telephone Encounter (Signed)
 Patient called again to check the status of the nurse calling her back

## 2023-06-15 ENCOUNTER — Other Ambulatory Visit: Payer: Self-pay

## 2023-06-16 ENCOUNTER — Telehealth: Payer: Self-pay

## 2023-06-16 NOTE — Telephone Encounter (Signed)
 Auth Submission: APPROVED Site of care: Site of care: [facility] Payer: [employer] Medicare Medication & CPT/J Code(s) submitted: Prolia  (Denosumab ) [id number] Route of submission (phone, fax, portal): portal Phone # Fax # Auth type: Buy/Bill PB Units/visits requested: 60mg , q26months x 2doses Reference number: [id number] Approval from: 06/15/23 to 06/14/24

## 2023-06-23 ENCOUNTER — Encounter: Attending: Gastroenterology | Admitting: *Deleted

## 2023-06-23 VITALS — BP 136/85 | HR 87 | Temp 97.6°F | Resp 16

## 2023-06-23 DIAGNOSIS — M810 Age-related osteoporosis without current pathological fracture: Secondary | ICD-10-CM | POA: Diagnosis not present

## 2023-06-23 MED ORDER — DENOSUMAB 60 MG/ML ~~LOC~~ SOSY
60.0000 mg | PREFILLED_SYRINGE | Freq: Once | SUBCUTANEOUS | Status: AC
Start: 2023-06-23 — End: 2023-06-23
  Administered 2023-06-23: 60 mg via SUBCUTANEOUS

## 2023-06-23 NOTE — Progress Notes (Signed)
 Diagnosis: Osteoporosis  Provider:  Armen Land, MD  Procedure: Injection  Prolia  (Denosumab ), Dose: 60 mg, Site: subcutaneous, Number of injections: 1  Injection Site(s): Left arm  Post Care: Observation period completed  Discharge: Condition: Good, Destination: Home . AVS Provided  Performed by:  Verneda Golder, RN

## 2023-07-04 ENCOUNTER — Telehealth: Payer: Self-pay

## 2023-07-04 MED ORDER — HYDROCORTISONE (PERIANAL) 2.5 % EX CREA: 1.0000 | TOPICAL_CREAM | Freq: Two times a day (BID) | CUTANEOUS | 0 refills | Status: AC

## 2023-07-04 NOTE — Addendum Note (Signed)
 Addended by: Lanney Pitts on: 07/04/2023 02:50 PM   Modules accepted: Orders

## 2023-07-04 NOTE — Telephone Encounter (Signed)
 Pt is calling requesting a refill on the procto med cream. Pt was last seen on 02/24/2022.

## 2023-08-01 DIAGNOSIS — R21 Rash and other nonspecific skin eruption: Secondary | ICD-10-CM | POA: Diagnosis not present

## 2023-08-01 DIAGNOSIS — Z6823 Body mass index (BMI) 23.0-23.9, adult: Secondary | ICD-10-CM | POA: Diagnosis not present

## 2023-08-15 ENCOUNTER — Ambulatory Visit: Payer: Medicare HMO | Admitting: Urology

## 2023-08-15 DIAGNOSIS — D225 Melanocytic nevi of trunk: Secondary | ICD-10-CM | POA: Diagnosis not present

## 2023-08-15 DIAGNOSIS — Z1283 Encounter for screening for malignant neoplasm of skin: Secondary | ICD-10-CM | POA: Diagnosis not present

## 2023-09-06 ENCOUNTER — Other Ambulatory Visit (HOSPITAL_COMMUNITY): Payer: Self-pay | Admitting: Internal Medicine

## 2023-09-06 DIAGNOSIS — Z1231 Encounter for screening mammogram for malignant neoplasm of breast: Secondary | ICD-10-CM

## 2023-10-06 ENCOUNTER — Encounter: Payer: Self-pay | Admitting: Gastroenterology

## 2023-10-10 ENCOUNTER — Encounter: Payer: Self-pay | Admitting: Gastroenterology

## 2023-10-11 ENCOUNTER — Telehealth: Payer: Self-pay | Admitting: Internal Medicine

## 2023-10-11 MED ORDER — METOPROLOL SUCCINATE ER 25 MG PO TB24: 25.0000 mg | ORAL_TABLET | Freq: Every day | ORAL | 0 refills | Status: DC

## 2023-10-11 NOTE — Telephone Encounter (Signed)
*  STAT* If patient is at the pharmacy, call can be transferred to refill team.   1. Which medications need to be refilled? (please list name of each medication and dose if known)   metoprolol  succinate (TOPROL  XL) 25 MG 24 hr tablet   2. Would you like to learn more about the convenience, safety, & potential cost savings by using the [facility]?   3. Are you open to using the [facility] (Type [facility]. ).  4. Which pharmacy/location (including street and city if local pharmacy) is medication to be sent to?  [facility] [address] [place] - [address]   5. Do they need a 30 day or 90 day supply?   90 day  Patient stated she is almost out of this medication.  Patient has appointment scheduled with WENDI Qua, PA on 12/14/23.

## 2023-10-11 NOTE — Telephone Encounter (Signed)
 RX sent in

## 2023-10-14 ENCOUNTER — Telehealth: Payer: Self-pay

## 2023-10-14 MED ORDER — LINACLOTIDE 72 MCG PO CAPS: 72.0000 ug | ORAL_CAPSULE | Freq: Every day | ORAL | 0 refills | Status: DC

## 2023-10-14 NOTE — Telephone Encounter (Signed)
 Pt was made aware and a f/u appt was made.

## 2023-10-14 NOTE — Addendum Note (Signed)
 Addended by: EZZARD SONNY RAMAN on: 10/14/2023 10:57 AM   Modules accepted: Orders

## 2023-10-14 NOTE — Telephone Encounter (Signed)
 Lets make sure she has a follow up made Refill provided.

## 2023-10-14 NOTE — Telephone Encounter (Signed)
 Pt called requesting refills on Linzess , requesting that they be sent to [facility]. Pt was last seen on 02/24/2022.

## 2023-10-15 DIAGNOSIS — F5104 Psychophysiologic insomnia: Secondary | ICD-10-CM | POA: Diagnosis not present

## 2023-10-15 DIAGNOSIS — Z6823 Body mass index (BMI) 23.0-23.9, adult: Secondary | ICD-10-CM | POA: Diagnosis not present

## 2023-10-17 ENCOUNTER — Ambulatory Visit (HOSPITAL_COMMUNITY)
Admission: RE | Admit: 2023-10-17 | Discharge: 2023-10-17 | Disposition: A | Discharge status: Home / Self Care | Source: Ambulatory Visit | Attending: Internal Medicine | Admitting: Internal Medicine

## 2023-10-17 ENCOUNTER — Encounter (HOSPITAL_COMMUNITY): Payer: Self-pay

## 2023-10-17 DIAGNOSIS — Z1231 Encounter for screening mammogram for malignant neoplasm of breast: Secondary | ICD-10-CM | POA: Insufficient documentation

## 2023-10-18 DIAGNOSIS — Z23 Encounter for immunization: Secondary | ICD-10-CM | POA: Diagnosis not present

## 2023-10-18 DIAGNOSIS — F5104 Psychophysiologic insomnia: Secondary | ICD-10-CM | POA: Diagnosis not present

## 2023-10-18 DIAGNOSIS — R5383 Other fatigue: Secondary | ICD-10-CM | POA: Diagnosis not present

## 2023-10-18 DIAGNOSIS — Z862 Personal history of diseases of the blood and blood-forming organs and certain disorders involving the immune mechanism: Secondary | ICD-10-CM | POA: Diagnosis not present

## 2023-10-18 DIAGNOSIS — Z6823 Body mass index (BMI) 23.0-23.9, adult: Secondary | ICD-10-CM | POA: Diagnosis not present

## 2023-10-18 DIAGNOSIS — Z952 Presence of prosthetic heart valve: Secondary | ICD-10-CM | POA: Diagnosis not present

## 2023-11-04 ENCOUNTER — Encounter: Payer: Self-pay | Admitting: Urology

## 2023-11-04 ENCOUNTER — Ambulatory Visit (INDEPENDENT_AMBULATORY_CARE_PROVIDER_SITE_OTHER): Admitting: Urology

## 2023-11-04 VITALS — BP 116/76 | HR 78

## 2023-11-04 DIAGNOSIS — N3281 Overactive bladder: Secondary | ICD-10-CM | POA: Diagnosis not present

## 2023-11-04 DIAGNOSIS — E042 Nontoxic multinodular goiter: Secondary | ICD-10-CM

## 2023-11-04 DIAGNOSIS — R3129 Other microscopic hematuria: Secondary | ICD-10-CM

## 2023-11-04 LAB — URINALYSIS, ROUTINE W REFLEX MICROSCOPIC
Bilirubin, UA: NEGATIVE
Glucose, UA: NEGATIVE
Ketones, UA: NEGATIVE
Nitrite, UA: NEGATIVE
Protein,UA: NEGATIVE
RBC, UA: NEGATIVE
Specific Gravity, UA: 1.01 (ref 1.005–1.030)
Urobilinogen, Ur: 0.2 mg/dL (ref 0.2–1.0)
pH, UA: 6.5 (ref 5.0–7.5)

## 2023-11-04 LAB — MICROSCOPIC EXAMINATION: Bacteria, UA: NONE SEEN

## 2023-11-04 MED ORDER — MYRBETRIQ 25 MG PO TB24: 25.0000 mg | ORAL_TABLET | Freq: Every day | ORAL | 11 refills | Status: AC

## 2023-11-04 NOTE — Progress Notes (Signed)
 11/04/2023 12:42 PM   Amber Bird [birth year] [id number]  Referring provider: Trudy Burkes FALCON, MD [address] [place],  [place] [address]  Followup OAB.    HPI: [name] is a 78yo here for followup for microhematuria and OAB. She remains on mirabegron  25mg  daily. UA today shows 0-2 RBCs. She denies any worsening LUTS. Urine stream strong. No straining to urinate. Nocturia 1x. Her urine has a strong odor.    PMH: Past Medical History:  Diagnosis Date   Anemia    Anxiety 11/28/2020   Asthma    CAD (coronary artery disease) 09/19/2020   Chronic daily headache 06/06/2014   Complication of anesthesia    drop in blood pressure years ago after hysterectomy     Depression    Dyspnea    Dysrhythmia    History of bleeding ulcers    Hypercholesteremia    Mitral valve prolapse    Multiple thyroid  nodules    Osteoarthritis of knees, bilateral 05/23/2019   Sarcoidosis    Seasonal allergies     Surgical History: Past Surgical History:  Procedure Laterality Date   ABDOMINAL HYSTERECTOMY     complete.    BIOPSY  10/02/2018   Procedure: BIOPSY;  Surgeon: Shaaron Lamar HERO, MD;  [facility];  Service: Endoscopy;;  gastric   bleeding ulcer     CATARACT EXTRACTION W/PHACO Left 06/11/2019   Procedure: CATARACT EXTRACTION PHACO AND INTRAOCULAR LENS PLACEMENT (IOC) CDE: 4.63;  Surgeon: Harrie Agent, MD;  Location: [facility];  Service: Ophthalmology;  Laterality: Left;   CATARACT EXTRACTION W/PHACO Right 06/25/2019   Procedure: CATARACT EXTRACTION PHACO AND INTRAOCULAR LENS PLACEMENT RIGHT EYE;  Surgeon: Harrie Agent, MD;  Location: [facility];  Service: Ophthalmology;  Laterality: Right;  CDE: 5.61   CHOLECYSTECTOMY     COLONOSCOPY N/A 12/18/2015   Rourk: diverticulosis in sigmoid colon, otherwise normal    COLONOSCOPY WITH PROPOFOL  N/A 03/17/2022   Procedure: COLONOSCOPY WITH PROPOFOL ;  Surgeon: Shaaron Lamar HERO, MD;  [facility];  Service: Endoscopy;   Laterality: N/A;  9:00 am   Cyst removed from ovary  1973   dental implants     ESOPHAGOGASTRODUODENOSCOPY (EGD) WITH PROPOFOL  N/A 10/02/2018   Mild Schatzki ring status post dilation, small hiatal hernia, bleeding erosive gastropathy, gastritis on bx, no h pylori.  Procedure: ESOPHAGOGASTRODUODENOSCOPY (EGD) WITH PROPOFOL ;  Surgeon: Shaaron Lamar HERO, MD;  [facility];  Service: Endoscopy;  Laterality: N/A;  12:45pm   LAPAROSCOPIC CHOLECYSTECTOMY SINGLE SITE WITH INTRAOPERATIVE CHOLANGIOGRAM N/A 05/12/2016   Procedure: LAPAROSCOPIC CHOLECYSTECTOMY SINGLE SITE WITH INTRAOPERATIVE CHOLANGIOGRAM;  Surgeon: Elspeth Schultze, MD;  Location: [facility];  Service: General;  Laterality: N/A;   left hammer toe repair     MALONEY DILATION N/A 10/02/2018   Procedure: AGAPITO DILATION;  Surgeon: Shaaron Lamar HERO, MD;  [facility];  Service: Endoscopy;  Laterality: N/A;   MITRAL VALVE REPLACEMENT     RIGHT/LEFT HEART CATH AND CORONARY ANGIOGRAPHY N/A 09/18/2020   Procedure: RIGHT/LEFT HEART CATH AND CORONARY ANGIOGRAPHY;  Surgeon: Swaz[place], Peter M, MD;  Location: [facility] INVASIVE CV LAB;  Service: Cardiovascular;  Laterality: N/A;   TEE WITHOUT CARDIOVERSION N/A 09/18/2020   Procedure: TRANSESOPHAGEAL ECHOCARDIOGRAM (TEE);  Surgeon: Delford Maude BROCKS, MD;  Location: [facility] ENDOSCOPY;  Service: Cardiovascular;  Laterality: N/A;   thyroid  needle biopsy     VIDEO BRONCHOSCOPY Bilateral 03/21/2017   Did not have done    Home Medications:  Allergies as of 11/04/2023  Reactions   Other Anaphylaxis   Cigarette smoke and/or strong chemical smells make my throat start to close        Medication List        Accurate as of November 04, 2023 12:42 PM. If you have any questions, ask your nurse or doctor.          albuterol  108 (90 Base) MCG/ACT inhaler Commonly known as: VENTOLIN  HFA Inhale 1-2 puffs into the lungs every 6 (six) hours as needed for wheezing or shortness of breath.   aspirin   EC 81 MG tablet Take 1 tablet (81 mg total) by mouth daily. Swallow whole.   atorvastatin  20 MG tablet Commonly known as: LIPITOR Take 20 mg by mouth at bedtime.   CALCIUM -MAGNESIUM-ZINC PO Take 1 tablet by mouth in the morning, at noon, and at bedtime.   cetirizine  10 MG tablet Commonly known as: ZYRTEC  Take 10 mg by mouth at bedtime.   colchicine 0.6 MG tablet Take by mouth.   Durapatite (Hydroxyapatite) Powd Take by mouth.   hydrocortisone  2.5 % rectal cream Commonly known as: Procto-Med HC  Place 1 Application rectally 2 (two) times daily.   linaclotide  72 MCG capsule Commonly known as: Linzess  Take 1 capsule (72 mcg total) by mouth daily before breakfast.   MAGNESIUM PO Take by mouth.   metoprolol  succinate 25 MG 24 hr tablet Commonly known as: Toprol  XL Take 1 tablet (25 mg total) by mouth daily.   metoprolol  tartrate 25 MG tablet Commonly known as: LOPRESSOR  Take by mouth.   Myrbetriq  25 MG Tb24 tablet Generic drug: mirabegron  ER Take 1 tablet (25 mg total) by mouth daily.   ofloxacin 0.3 % OTIC solution Commonly known as: FLOXIN 5 drops to left ear twice daily.   pantoprazole  40 MG tablet Commonly known as: PROTONIX  Take 1 tablet (40 mg total) by mouth daily before breakfast.   Vitamin D3 125 MCG (5000 UT) Tabs Take 5,000 Units by mouth every other day.   zolpidem  10 MG tablet Commonly known as: AMBIEN  Take 10 mg by mouth at bedtime as needed for sleep.        Allergies:  Allergies  Allergen Reactions   Other Anaphylaxis    Cigarette smoke and/or strong chemical smells make my throat start to close    Family History: Family History  Problem Relation Age of Onset   Breast cancer Mother 4   Tuberculosis Mother    Heart disease Father    Colon cancer Other     Social History:  reports that she has never smoked. She has been exposed to tobacco smoke. She has never used smokeless tobacco. She reports that she does not drink alcohol  and does not use drugs.  ROS: All other review of systems were reviewed and are negative except what is noted above in HPI  Physical Exam: BP 116/76   Pulse 78   Constitutional:  Alert and oriented, No acute distress. HEENT: [place] AT, moist mucus membranes.  Trachea midline, no masses. Cardiovascular: No clubbing, cyanosis, or edema. Respiratory: Normal respiratory effort, no increased work of breathing. GI: Abdomen is soft, nontender, nondistended, no abdominal masses GU: No CVA tenderness.  Lymph: No cervical or inguinal lymphadenopathy. Skin: No rashes, bruises or suspicious lesions. Neurologic: Grossly intact, no focal deficits, moving all 4 extremities. Psychiatric: Normal mood and affect.  Laboratory Data: Lab Results  Component Value Date   WBC 7.0 12/30/2021   HGB 14.7 12/30/2021   HCT 44.2 12/30/2021   MCV  92.5 12/30/2021   PLT 287 12/30/2021    Lab Results  Component Value Date   CREATININE 0.90 07/27/2022    No results found for: PSA  No results found for: TESTOSTERONE  Lab Results  Component Value Date   HGBA1C 5.5 02/03/2017    Urinalysis    Component Value Date/Time   COLORURINE STRAW (A) 09/07/2018 0909   APPEARANCEUR Clear 02/14/2023 1049   LABSPEC 1.003 (L) 09/07/2018 0909   PHURINE 6.0 09/07/2018 0909   GLUCOSEU Negative 02/14/2023 1049   HGBUR NEGATIVE 09/07/2018 0909   BILIRUBINUR Negative 02/14/2023 1049   KETONESUR NEGATIVE 09/07/2018 0909   PROTEINUR Negative 02/14/2023 1049   PROTEINUR NEGATIVE 09/07/2018 0909   NITRITE Negative 02/14/2023 1049   NITRITE NEGATIVE 09/07/2018 0909   LEUKOCYTESUR 1+ (A) 02/14/2023 1049   LEUKOCYTESUR NEGATIVE 09/07/2018 0909    Lab Results  Component Value Date   LABMICR See below: 02/14/2023   WBCUA 6-10 (A) 02/14/2023   LABEPIT 0-10 02/14/2023   BACTERIA None seen 02/14/2023    Pertinent Imaging:  No results found for this or any previous visit.  No results found for this or any  previous visit.  No results found for this or any previous visit.  No results found for this or any previous visit.  Results for orders placed during the hospital encounter of 07/18/17  US  RENAL  Narrative CLINICAL DATA:  78 year old female with congenital obstruction ureteral pelvic junction. Initial encounter.  EXAM: RENAL / URINARY TRACT ULTRASOUND COMPLETE  COMPARISON:  03/31/2015 CT.  FINDINGS: Right Kidney:  Length: 9.5 cm. Under rotation right kidney. Fullness right renal collecting system similar to prior CT suggestive of mild right ureteral pelvic junction obstruction.  Left Kidney:  Length: 9.6 cm. Echogenicity within normal limits. No mass or hydronephrosis visualized.  Bladder:  Appears normal for degree of bladder distention.  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 Findings suggestive of mild right ureteral pelvic junction obstruction. Appearance similar to the prior CT.  Otherwise negative exam.   Electronically Signed By: Elspeth Slain M.D. On: 07/18/2017 11:03  No results found for this or any previous visit.  Results for orders placed in visit on 06/16/22  CT HEMATURIA WORKUP  Narrative CLINICAL DATA:  microhematuria  EXAM: CT ABDOMEN AND PELVIS WITHOUT AND WITH CONTRAST  TECHNIQUE: Multidetector CT imaging of the abdomen and pelvis was performed following the standard protocol before and following the bolus administration of intravenous contrast.  RADIATION DOSE REDUCTION: This exam was performed according to the departmental dose-optimization program which includes automated exposure control, adjustment of the mA and/or kV according to patient size and/or use of iterative reconstruction technique.  CONTRAST:  OMNIPAQUE  IOHEXOL  300 MG/ML  SOLN  COMPARISON:  CT scan abdomen and pelvis from 09/07/2018.  FINDINGS: Lower chest: There are patchy atelectatic changes in the visualized lung bases. No overt  consolidation. No pleural effusion. The heart is normal in size. No pericardial effusion.  Hepatobiliary: The liver is normal in size. Non-cirrhotic configuration. No suspicious mass. These is mild diffuse hepatic steatosis. No intrahepatic bile duct dilation. There is mild prominence of the extrahepatic bile duct, most likely due to post cholecystectomy status. Gallbladder is surgically absent.  Pancreas: Unremarkable. No pancreatic ductal dilatation or surrounding inflammatory changes.  Spleen: Within normal limits. No focal lesion.  Adrenals/Urinary Tract: Adrenal glands are unremarkable. 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 No suspicious renal mass. No hydronephrosis. No renal or ureteric calculi. Urinary bladder  is under distended, precluding optimal assessment. However, no large mass or stones identified. There is mild irregular wall thickening, which may be due to underdistention or due to chronic cystitis. Correlate clinically and with urinalysis. No perivesical fat stranding.  Stomach/Bowel: No disproportionate dilation of the small or large bowel loops. No evidence of abnormal bowel wall thickening or inflammatory changes. The appendix is unremarkable. There are at least 2 diverticuli arising from the duodenum. There is a tiny hiatal hernia.  Vascular/Lymphatic: No ascites or pneumoperitoneum. No abdominal or pelvic lymphadenopathy, by size criteria. No aneurysmal dilation of the major abdominal arteries. There are mild peripheral atherosclerotic vascular calcifications of the aorta and its major branches.  Reproductive: The uterus is surgically absent. No large adnexal mass.  Other: The visualized soft tissues and abdominal wall are unremarkable.  Musculoskeletal: No suspicious osseous lesions. There are mild - moderate multilevel degenerative changes in the visualized spine.  IMPRESSION: 1. No nephroureterolithiasis or obstructive  uropathy. No suspicious renal mass. 2. Mild irregular wall thickening of the urinary bladder, which may be due to underdistention or due to chronic cystitis. Correlate clinically and with urinalysis. 3. Multiple other nonacute observations, as described above.  Aortic Atherosclerosis (ICD10-I70.0).   Electronically Signed By: Ree Molt M.D. On: 08/09/2022 09:06  No results found for this or any previous visit.   Assessment & Plan:    1. OAB (overactive bladder) (Primary) -continue mirabegron  25mg  daily - Urinalysis, Routine w reflex microscopic  2. Microhematuria Followup 1 year with UA   No follow-ups on file.  Belvie Clara, MD  [facility] [place]

## 2023-11-04 NOTE — Addendum Note (Signed)
 Addended by: Clovis Mankins L on: 11/04/2023 12:56 PM   Modules accepted: Orders

## 2023-11-04 NOTE — Patient Instructions (Signed)

## 2023-11-08 ENCOUNTER — Ambulatory Visit: Admitting: Internal Medicine

## 2023-12-05 ENCOUNTER — Encounter: Payer: Self-pay | Admitting: Internal Medicine

## 2023-12-05 ENCOUNTER — Ambulatory Visit: Admitting: Internal Medicine

## 2023-12-05 VITALS — BP 116/77 | HR 79 | Temp 97.6°F | Ht 63.5 in | Wt 137.4 lb

## 2023-12-05 DIAGNOSIS — K219 Gastro-esophageal reflux disease without esophagitis: Secondary | ICD-10-CM | POA: Diagnosis not present

## 2023-12-05 DIAGNOSIS — K5900 Constipation, unspecified: Secondary | ICD-10-CM | POA: Diagnosis not present

## 2023-12-05 MED ORDER — LINACLOTIDE 72 MCG PO CAPS: 72.0000 ug | ORAL_CAPSULE | Freq: Every day | ORAL | 3 refills | Status: AC

## 2023-12-05 NOTE — Patient Instructions (Signed)
 It was nice to see you again today!  Continue Protonix  or pantoprazole  40 mg daily (dispense 90 with 3 additional refills)  Continue Linzess  72 gelcaps -1 daily.  Dispense 90 with 3 additional refills.  Unless something comes up, plan to see back in the office in 1 year.

## 2023-12-05 NOTE — Progress Notes (Unsigned)
 Gastroenterology Progress Note    Primary Care Physician:  Trudy Vaughn FALCON, MD Primary Gastroenterologist:  Dr. Shaaron  Pre-Procedure History & Physical: HPI:  Amber Bird is a 78 y.o. female here for follow-up of anal pain colonoscopy 03/17/2022 revealed no abnormalities.  Anal pain resolved with Linzess  72 daily.  Has not had any bowel issues since that time.  Takes Protonix  40 mg daily with excellent control of reflux.  She is very happy and has no active GI symptoms at this time.  She is aged out 50 future colonoscopy unless new symptoms develop. Past Medical History:  Diagnosis Date   Anemia    Anxiety 11/28/2020   Asthma    CAD (coronary artery disease) 09/19/2020   Chronic daily headache 06/06/2014   Complication of anesthesia    drop in blood pressure years ago after hysterectomy     Depression    Dyspnea    Dysrhythmia    History of bleeding ulcers    Hypercholesteremia    Mitral valve prolapse    Multiple thyroid  nodules    Osteoarthritis of knees, bilateral 05/23/2019   Sarcoidosis    Seasonal allergies     Past Surgical History:  Procedure Laterality Date   ABDOMINAL HYSTERECTOMY     complete.    BIOPSY  10/02/2018   Procedure: BIOPSY;  Surgeon: Shaaron Lamar HERO, MD;  [facility];  Service: Endoscopy;;  gastric   bleeding ulcer     CATARACT EXTRACTION W/PHACO Left 06/11/2019   Procedure: CATARACT EXTRACTION PHACO AND INTRAOCULAR LENS PLACEMENT (IOC) CDE: 4.63;  Surgeon: Harrie Agent, MD;  Location: [facility];  Service: Ophthalmology;  Laterality: Left;   CATARACT EXTRACTION W/PHACO Right 06/25/2019   Procedure: CATARACT EXTRACTION PHACO AND INTRAOCULAR LENS PLACEMENT RIGHT EYE;  Surgeon: Harrie Agent, MD;  Location: [facility];  Service: Ophthalmology;  Laterality: Right;  CDE: 5.61   CHOLECYSTECTOMY     COLONOSCOPY N/A 12/18/2015   Lexii Walsh: diverticulosis in sigmoid colon, otherwise normal    COLONOSCOPY WITH PROPOFOL  N/A 03/17/2022    Procedure: COLONOSCOPY WITH PROPOFOL ;  Surgeon: Shaaron Lamar HERO, MD;  [facility];  Service: Endoscopy;  Laterality: N/A;  9:00 am   Cyst removed from ovary  1973   dental implants     ESOPHAGOGASTRODUODENOSCOPY (EGD) WITH PROPOFOL  N/A 10/02/2018   Mild Schatzki ring status post dilation, small hiatal hernia, bleeding erosive gastropathy, gastritis on bx, no h pylori.  Procedure: ESOPHAGOGASTRODUODENOSCOPY (EGD) WITH PROPOFOL ;  Surgeon: Shaaron Lamar HERO, MD;  [facility];  Service: Endoscopy;  Laterality: N/A;  12:45pm   LAPAROSCOPIC CHOLECYSTECTOMY SINGLE SITE WITH INTRAOPERATIVE CHOLANGIOGRAM N/A 05/12/2016   Procedure: LAPAROSCOPIC CHOLECYSTECTOMY SINGLE SITE WITH INTRAOPERATIVE CHOLANGIOGRAM;  Surgeon: Elspeth Schultze, MD;  Location: [facility];  Service: General;  Laterality: N/A;   left hammer toe repair     MALONEY DILATION N/A 10/02/2018   Procedure: AGAPITO DILATION;  Surgeon: Shaaron Lamar HERO, MD;  [facility];  Service: Endoscopy;  Laterality: N/A;   MITRAL VALVE REPLACEMENT     RIGHT/LEFT HEART CATH AND CORONARY ANGIOGRAPHY N/A 09/18/2020   Procedure: RIGHT/LEFT HEART CATH AND CORONARY ANGIOGRAPHY;  Surgeon: Jordan, Peter M, MD;  Location: [facility] INVASIVE CV LAB;  Service: Cardiovascular;  Laterality: N/A;   TEE WITHOUT CARDIOVERSION N/A 09/18/2020   Procedure: TRANSESOPHAGEAL ECHOCARDIOGRAM (TEE);  Surgeon: Delford Maude BROCKS, MD;  Location: [facility] ENDOSCOPY;  Service: Cardiovascular;  Laterality: N/A;   thyroid  needle biopsy  VIDEO BRONCHOSCOPY Bilateral 03/21/2017   Did not have done    Prior to Admission medications   Medication Sig Start Date End Date Taking? Authorizing Provider  albuterol  (VENTOLIN  HFA) 108 (90 Base) MCG/ACT inhaler Inhale 1-2 puffs into the lungs every 6 (six) hours as needed for wheezing or shortness of breath.   Yes [provider]  aspirin  EC 81 MG tablet Take 1 tablet (81 mg total) by mouth daily. Swallow whole. 03/27/21  Yes  Okey Vina GAILS, MD  atorvastatin  (LIPITOR) 20 MG tablet Take 20 mg by mouth at bedtime.   Yes [provider]  CALCIUM -MAGNESIUM-ZINC PO Take 1 tablet by mouth in the morning, at noon, and at bedtime.   Yes [provider]  cetirizine  (ZYRTEC ) 10 MG tablet Take 10 mg by mouth at bedtime.   Yes [provider]  Cholecalciferol  (VITAMIN D3) 125 MCG (5000 UT) TABS Take 5,000 Units by mouth every other day.   Yes [provider]  colchicine 0.6 MG tablet Take by mouth.   Yes [provider]  Durapatite, Hydroxyapatite, POWD Take by mouth.   Yes [provider]  hydrocortisone  (PROCTO-MED HC ) 2.5 % rectal cream Place 1 Application rectally 2 (two) times daily. 07/04/23  Yes Ezzard Sonny RAMAN, PA-C  linaclotide  (LINZESS ) 72 MCG capsule Take 1 capsule (72 mcg total) by mouth daily before breakfast. 10/14/23  Yes Ezzard Sonny RAMAN, PA-C  MAGNESIUM PO Take by mouth.   Yes [provider]  metoprolol  succinate (TOPROL  XL) 25 MG 24 hr tablet Take 1 tablet (25 mg total) by mouth daily. 10/11/23  Yes Okey Vina GAILS, MD  metoprolol  tartrate (LOPRESSOR ) 25 MG tablet Take by mouth.   Yes [provider]  MYRBETRIQ  25 MG TB24 tablet Take 1 tablet (25 mg total) by mouth daily. 11/04/23  Yes McKenzie, Belvie CROME, MD  ofloxacin (FLOXIN) 0.3 % OTIC solution 5 drops to left ear twice daily. 01/19/23  Yes [provider]  pantoprazole  (PROTONIX ) 40 MG tablet Take 1 tablet (40 mg total) by mouth daily before breakfast. 02/25/23  Yes Ezzard Sonny RAMAN, PA-C  traZODone (DESYREL) 50 MG tablet Take 100 mg by mouth at bedtime as needed. 10/15/23  Yes [provider]  triamcinolone (KENALOG) 0.025 % cream Apply 1 Application topically 2 (two) times daily as needed.   Yes [provider]  zolpidem  (AMBIEN ) 10 MG tablet Take 10 mg by mouth at bedtime as needed for sleep.   Yes [provider]    Allergies as of 12/05/2023 - Review  Complete 12/05/2023  Allergen Reaction Noted   Other Anaphylaxis 03/11/2022    Family History  Problem Relation Age of Onset   Breast cancer Mother 80   Tuberculosis Mother    Heart disease Father    Colon cancer Other     Social History   Socioeconomic History   Marital status: Divorced    Spouse name: Not on file   Number of children: Not on file   Years of education: Not on file   Highest education level: Not on file  Occupational History   Not on file  Tobacco Use   Smoking status: Never    Passive exposure: Past   Smokeless tobacco: Never  Vaping Use   Vaping status: Never Used  Substance and Sexual Activity   Alcohol use: No   Drug use: No   Sexual activity: Not Currently    Birth control/protection: None  Other Topics Concern   Not  on file  Social History Narrative   Moved from [facility] 2009. In 2008 when the economy dropped lost job. Reports that after lost job was hard to get another job.  Is not married and does not have children. Used to enjoy horses. Reports that moved here b/c it was a lot cheaper and warmer. Had visited here for a horse event in 1990 so decided to move here. Lives in [place], [place]. Reports that made stupid financial decisions and is dealing with that at this time.    Social Drivers of Health   Financial Resource Strain: Medium Risk (08/29/2020)   Overall Financial Resource Strain (CARDIA)    Difficulty of Paying Living Expenses: Somewhat hard  Food Insecurity: No Food Insecurity (08/29/2020)   Hunger Vital Sign    Worried About Running Out of Food in the Last Year: Never true    Ran Out of Food in the Last Year: Never true  Transportation Needs: No Transportation Needs (12/10/2020)   Received from [facility] - Transportation    In the past 12 months, has lack of transportation kept you from medical appointments or from getting medications?: No    Lack of Transportation (Non-Medical): No  Physical Activity: Not  on file  Stress: Not on file  Social Connections: Not on file  Intimate Partner Violence: Not on file    Review of Systems   See HPI, otherwise negative ROS  Physical Exam: BP 116/77 (BP Location: Right Arm, Patient Position: Sitting, Cuff Size: Normal)   Pulse 79   Temp 97.6 F (36.4 C) (Oral)   Ht 5' 3.5 (1.613 m)   Wt 137 lb 6.4 oz (62.3 kg)   SpO2 95%   BMI 23.96 kg/m  General:   Alert,  Well-developed, well-nourished, pleasant and cooperative in NAD Neck:  Supple; no masses or thyromegaly. No significant cervical adenopathy. Lungs:  Clear throughout to auscultation.   No wheezes, crackles, or rhonchi. No acute distress. Heart:  Regular rate and rhythm; no murmurs, clicks, rubs,  or gallops. Abdomen: Non-distended, normal bowel sounds.  Soft and nontender without appreciable mass or hepatosplenomegaly.  Impression/Plan:   Constipation self-limiting proctalgia with effective management.  Doing well on Linzess  72 daily.  GERD well-controlled on pantoprazole  40 mg daily.  Recommendations:   Continue Protonix  or pantoprazole  40 mg daily (dispense 90 with 3 additional refills)  Continue Linzess  72 gelcaps -1 daily.  Dispense 90 with 3 additional refills.  Unless something comes up, plan to see back in the office in 1 year.  Notice: This dictation was prepared with Dragon dictation along with smaller phrase technology. Any transcriptional errors that result from this process are unintentional and may not be corrected upon review.

## 2023-12-06 DIAGNOSIS — M79671 Pain in right foot: Secondary | ICD-10-CM | POA: Diagnosis not present

## 2023-12-06 DIAGNOSIS — M21621 Bunionette of right foot: Secondary | ICD-10-CM | POA: Diagnosis not present

## 2023-12-06 DIAGNOSIS — M869 Osteomyelitis, unspecified: Secondary | ICD-10-CM | POA: Diagnosis not present

## 2023-12-08 ENCOUNTER — Encounter (INDEPENDENT_AMBULATORY_CARE_PROVIDER_SITE_OTHER): Payer: Self-pay | Admitting: Gastroenterology

## 2023-12-14 ENCOUNTER — Ambulatory Visit: Attending: Student | Admitting: Student

## 2023-12-14 ENCOUNTER — Encounter: Payer: Self-pay | Admitting: Student

## 2023-12-14 VITALS — BP 132/76 | HR 73 | Ht 63.5 in | Wt 138.6 lb

## 2023-12-14 DIAGNOSIS — Z79899 Other long term (current) drug therapy: Secondary | ICD-10-CM

## 2023-12-14 DIAGNOSIS — R002 Palpitations: Secondary | ICD-10-CM | POA: Diagnosis not present

## 2023-12-14 DIAGNOSIS — I2510 Atherosclerotic heart disease of native coronary artery without angina pectoris: Secondary | ICD-10-CM

## 2023-12-14 DIAGNOSIS — E785 Hyperlipidemia, unspecified: Secondary | ICD-10-CM

## 2023-12-14 DIAGNOSIS — I059 Rheumatic mitral valve disease, unspecified: Secondary | ICD-10-CM

## 2023-12-14 DIAGNOSIS — I349 Nonrheumatic mitral valve disorder, unspecified: Secondary | ICD-10-CM | POA: Diagnosis not present

## 2023-12-14 MED ORDER — METOPROLOL SUCCINATE ER 25 MG PO TB24: 25.0000 mg | ORAL_TABLET | Freq: Every day | ORAL | 3 refills | Status: DC

## 2023-12-14 NOTE — Progress Notes (Signed)
 Cardiology Office Note    Date:  12/14/2023  ID:  Amber Bird, DOB 03/21/1945, MRN [id number] Cardiologist: Vina Gull, MD Cardiology APP:  Amber Bird HERO, PA-C { : History of Present Illness:    Amber Bird is a 78 y.o. female with past medical history of MVP/mitral valve regurgitation (s/p repair with mitral valve ring at [facility] in 11/2020), postoperative atrial fibrillation, CAD (minimal nonobstructive CAD by cath in 09/2020), palpitations and HLD who presents to the office today for overdue 28-month follow-up.  She was last examined by Dr. Gull in 10/2022 and reported overall doing well at that time and denied any chest pain or progressive dyspnea on exertion. No changes were made to her cardiac medications and she was continued on ASA 81 mg daily, Atorvastatin  20 mg daily and Toprol -XL 25 mg daily.  In talking with the patient today, she reports overall feeling well since her last office visit. She remains active at baseline and walks on her treadmill for a total of 1.5 to 2 miles each day and denies any recent chest pain or dyspnea on exertion with this. No recent palpitations, orthopnea, PND or pitting edema.  Studies Reviewed:   EKG: EKG is ordered today and demonstrates:   EKG Interpretation Date/Time:  Wednesday December 14 2023 14:59:36 EST Ventricular Rate:  73 PR Interval:  154 QRS Duration:  68 QT Interval:  374 QTC Calculation: 412 R Axis:   46  Text Interpretation: Sinus rhythm with occasional Premature ventricular complexes Low voltage QRS When compared with ECG of 19-Oct-2022 10:18, No significant change was found Confirmed by Amber Bird ([id number]) on 12/14/2023 3:08:02 PM       R/LHC: 09/2020   Prox LAD to Mid LAD lesion is 25% stenosed.   The left ventricular systolic function is normal.   LV end diastolic pressure is normal.   The left ventricular ejection fraction is 55-65% by visual estimate.   There is severe (4+) mitral  regurgitation.   There is severe (4+) mitral regurgitation and severe mitral valve prolapse.   Minimal nonobstructive CAD Severe posterior mitral valve prolapse with severe MR Normal right heart and LV filling pressures   Plan: consider referral for MV repair.   Echocardiogram: 09/2021 IMPRESSIONS     1. Left ventricular ejection fraction, by estimation, is 50 to 55%. The  left ventricle has low normal function. The left ventricle demonstrates  global hypokinesis. Left ventricular diastolic parameters are consistent  with Grade I diastolic dysfunction  (impaired relaxation).   2. Right ventricular systolic function is normal. The right ventricular  size is normal. There is normal pulmonary artery systolic pressure. The  estimated right ventricular systolic pressure is 17.3 mmHg.   3. The mitral valve has been repaired/replaced. Mild mitral valve  regurgitation. No evidence of mitral stenosis.   4. The aortic valve is tricuspid. Aortic valve regurgitation is not  visualized. Aortic valve sclerosis is present, with no evidence of aortic  valve stenosis.   5. There is mild dilatation of the aortic root, measuring 38 mm.   6. The inferior vena cava is normal in size with greater than 50%  respiratory variability, suggesting right atrial pressure of 3 mmHg.    Event Monitor: 06/2022 Patch Wear Time:  2 days and 11 hours (2024-05-10T06:24:07-398 to 2024-05-12T17:35:51-0400)   Impression:   Predominant rhythm:  SR   Overall heart range from 61 to 169 bpm    AVerage HR 74 bpmes Rare PAC and rare PVC 3  runs VT, fasted at 164 bpm for 8 beats, longest for 14.9 sec (129 bpm) 7 busts SVT, longest for 14 beats with maximal rate of 169 bpm   Diary entries/ triggered events corresponded to SR or SR with PAC   SVT and VT episodes were not sensed  Patient had a min HR of 61 bpm, max HR of 169 bpm, and avg HR of 74 bpm. Predominant underlying rhythm was Sinus Rhythm. 3 Ventricular  Tachycardia runs occurred, the run with the fastest interval lasting 8 beats with a max rate of 164 bpm, the longest  lasting 14.9 secs with an avg rate of 129 bpm. 7 Supraventricular Tachycardia runs occurred, the run with the fastest interval lasting 14 beats with a max rate of 169 bpm (avg 152 bpm); the run with the fastest interval was also the longest. Isolated  SVEs were rare (<1.0%), SVE Couplets were rare (<1.0%), and SVE Triplets were rare (<1.0%). Isolated VEs were rare (<1.0%), VE Couplets were rare (<1.0%), and no VE Triplets were present. Ventricular Bigeminy and Trigeminy were present.    Physical Exam:   VS:  BP 132/76   Pulse 73   Ht 5' 3.5 (1.613 m)   Wt 138 lb 9.6 oz (62.9 kg)   SpO2 97%   BMI 24.17 kg/m    Wt Readings from Last 3 Encounters:  12/14/23 138 lb 9.6 oz (62.9 kg)  12/05/23 137 lb 6.4 oz (62.3 kg)  04/21/23 138 lb (62.6 kg)     GEN: Well nourished, well developed female appearing in no acute distress NECK: No JVD; No carotid bruits CARDIAC: RRR, no murmurs, rubs, gallops RESPIRATORY:  Clear to auscultation without rales, wheezing or rhonchi  ABDOMEN: Appears non-distended. No obvious abdominal masses. EXTREMITIES: No clubbing or cyanosis. No pitting edema.  Distal pedal pulses are 2+ bilaterally.   Assessment and Plan:   1. Mitral valve disease - She previously underwent mitral valve repair with a 36 mm ring in 11/2020 at [facility]. Echocardiogram in 09/2021 showed normal valve function with mild MR. She does not have a significant murmur on examination today. Would anticipate repeat imaging in the next 1 to 2 years for reassessment.  2. Coronary artery disease involving native coronary artery of native heart without angina pectoris - Prior cardiac catheterization in 09/2020 showed minimal, nonobstructive disease as outlined above. She remains active at baseline and denies any recent anginal symptoms. Continue current medical therapy with ASA 81 mg daily  and Atorvastatin  20 mg daily.  3. Hyperlipidemia LDL goal <70 - LDL was at 74 when checked in 10/2022 but she has not had recent labs. Will recheck a FLP and CMET. Continue Atorvastatin  20 mg daily.  4. Palpitations - Most recent monitor in 06/2022 showed predominantly normal sinus rhythm with rare PAC's and PVC's with short bursts of SVT and VT as described above.  - She denies any recent palpitations. Continue current medical therapy with Toprol -XL 25 mg daily.  Signed, Bird CHRISTELLA Qua, PA-C

## 2023-12-14 NOTE — Patient Instructions (Signed)
 Medication Instructions:  Your physician recommends that you continue on your current medications as directed. Please refer to the Current Medication list given to you today.  *If you need a refill on your cardiac medications before your next appointment, please call your pharmacy*  Lab Work: CBC CMET Fasting Lipid Panel   If you have labs (blood work) drawn today and your tests are completely normal, you will receive your results only by: MyChart Message (if you have MyChart) OR A paper copy in the mail If you have any lab test that is abnormal or we need to change your treatment, we will call you to review the results.  Testing/Procedures: None  Follow-Up: At [facility], you and your health needs are our priority.  As part of our continuing mission to provide you with exceptional heart care, our providers are all part of one team.  This team includes your primary Cardiologist (physician) and Advanced Practice Providers or APPs (Physician Assistants and Nurse Practitioners) who all work together to provide you with the care you need, when you need it.  Your next appointment:   1 year(s)  Provider:   You may see Vina Gull, MD or one of the following Advanced Practice Providers on your designated Care Team:   Laymon Qua, PA-C  [place], [place] Olivia Pavy, [place]     We recommend signing up for the patient portal called MyChart.  Sign up information is provided on this After Visit Summary.  MyChart is used to connect with patients for Virtual Visits (Telemedicine).  Patients are able to view lab/test results, encounter notes, upcoming appointments, etc.  Non-urgent messages can be sent to your provider as well.   To learn more about what you can do with MyChart, go to [email or web address].   Other Instructions

## 2023-12-15 ENCOUNTER — Telehealth: Payer: Self-pay | Admitting: Student

## 2023-12-15 NOTE — Telephone Encounter (Signed)
 Pt is requesting a callback regarding some concerns about her bp after reading what it was on her discharge summary yesterday at her office visit. Please advise
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
## 2023-12-19 ENCOUNTER — Telehealth: Payer: Self-pay | Admitting: Student

## 2023-12-19 NOTE — Telephone Encounter (Signed)
 Pt requesting a lab order to have her Glucose checked.

## 2023-12-20 NOTE — Telephone Encounter (Signed)
 Left message for patient that her glucose IS included in the lab work we have asked her to get and they needs to be fasting for labs

## 2023-12-22 LAB — COMPREHENSIVE METABOLIC PANEL WITH GFR
ALT: 23 IU/L (ref 0–32)
AST: 26 IU/L (ref 0–40)
Albumin: 4.4 g/dL (ref 3.8–4.8)
Alkaline Phosphatase: 64 IU/L (ref 49–135)
BUN/Creatinine Ratio: 13 (ref 12–28)
BUN: 12 mg/dL (ref 8–27)
Bilirubin Total: 0.6 mg/dL (ref 0.0–1.2)
CO2: 24 mmol/L (ref 20–29)
Calcium: 9.9 mg/dL (ref 8.7–10.3)
Chloride: 102 mmol/L (ref 96–106)
Creatinine, Ser: 0.96 mg/dL (ref 0.57–1.00)
Globulin, Total: 2.2 g/dL (ref 1.5–4.5)
Glucose: 92 mg/dL (ref 70–99)
Potassium: 4.5 mmol/L (ref 3.5–5.2)
Sodium: 139 mmol/L (ref 134–144)
Total Protein: 6.6 g/dL (ref 6.0–8.5)
eGFR: 61 mL/min/1.73 (ref >59)

## 2023-12-22 LAB — CBC WITH DIFFERENTIAL/PLATELET
Basophils Absolute: 0.1 x10E3/uL (ref 0.0–0.2)
Basos: 1 %
EOS (ABSOLUTE): 0.1 x10E3/uL (ref 0.0–0.4)
Eos: 1 %
Hematocrit: 45.2 % (ref 34.0–46.6)
Hemoglobin: 15 g/dL (ref 11.1–15.9)
Immature Grans (Abs): 0 x10E3/uL (ref 0.0–0.1)
Immature Granulocytes: 0 %
Lymphocytes Absolute: 0.7 x10E3/uL (ref 0.7–3.1)
Lymphs: 10 %
MCH: 31.3 pg (ref 26.6–33.0)
MCHC: 33.2 g/dL (ref 31.5–35.7)
MCV: 94 fL (ref 79–97)
Monocytes Absolute: 0.5 x10E3/uL (ref 0.1–0.9)
Monocytes: 7 %
Neutrophils Absolute: 5.3 x10E3/uL (ref 1.4–7.0)
Neutrophils: 80 %
Platelets: 283 x10E3/uL (ref 150–450)
RBC: 4.8 x10E6/uL (ref 3.77–5.28)
RDW: 12 % (ref 11.7–15.4)
WBC: 6.5 x10E3/uL (ref 3.4–10.8)

## 2023-12-22 LAB — LIPID PANEL
Chol/HDL Ratio: 2.3 ratio (ref 0.0–4.4)
Cholesterol, Total: 142 mg/dL (ref 100–199)
HDL: 63 mg/dL (ref >39)
LDL Chol Calc (NIH): 61 mg/dL (ref 0–99)
Triglycerides: 99 mg/dL (ref 0–149)
VLDL Cholesterol Cal: 18 mg/dL (ref 5–40)

## 2023-12-23 ENCOUNTER — Ambulatory Visit: Payer: Self-pay | Admitting: Student

## 2023-12-23 ENCOUNTER — Telehealth: Payer: Self-pay | Admitting: Internal Medicine

## 2023-12-23 NOTE — Telephone Encounter (Signed)
I will forward to provider for review °

## 2023-12-23 NOTE — Telephone Encounter (Signed)
 The patient has been notified of the result and verbalized understanding.  All questions (if any) were answered. Bernett Dorothyann LABOR, RN 12/23/2023 1:17 PM     Patient wants copy of labs, will pick up next week

## 2023-12-23 NOTE — Telephone Encounter (Signed)
 Result Note Please let the patient know her cholesterol is under excellent control with total cholesterol at 142, triglycerides 99 and LDL 61. Hemoglobin and platelets are within a normal range. Electrolytes, kidney function and liver function within normal limits as well. Continue current cardiac medications.  Comprehensive metabolic panel with GFR; CBC with Differential/Platelet; Lipid panel

## 2023-12-23 NOTE — Telephone Encounter (Signed)
 Patient called to follow-up on lab results.

## 2023-12-27 ENCOUNTER — Encounter: Attending: Gastroenterology | Admitting: *Deleted

## 2023-12-27 VITALS — BP 121/70 | HR 79 | Temp 97.5°F | Resp 18

## 2023-12-27 DIAGNOSIS — M810 Age-related osteoporosis without current pathological fracture: Secondary | ICD-10-CM

## 2023-12-27 MED ORDER — DENOSUMAB 60 MG/ML ~~LOC~~ SOSY
60.0000 mg | PREFILLED_SYRINGE | Freq: Once | SUBCUTANEOUS | Status: AC
  Administered 2023-12-27: 60 mg via SUBCUTANEOUS

## 2023-12-27 NOTE — Progress Notes (Signed)
 Diagnosis: , Osteoporosis  Provider:  Vaughn Pouch, MD  Procedure: Injection  Prolia  (Denosumab ), Dose: 60 mg, Site: subcutaneous, Number of injections: 1  Injection Site(s): Left arm  Post Care: Observation period completed  Discharge: Condition: Good, Destination: Home . AVS Declined  Performed by:  Baldwin Darice Helling, RN

## 2024-01-10 ENCOUNTER — Telehealth: Payer: Self-pay | Admitting: Internal Medicine

## 2024-01-10 MED ORDER — METOPROLOL SUCCINATE ER 25 MG PO TB24: 25.0000 mg | ORAL_TABLET | Freq: Every day | ORAL | 3 refills | Status: AC

## 2024-01-10 NOTE — Telephone Encounter (Signed)
*  STAT* If patient is at the pharmacy, call can be transferred to refill team.   1. Which medications need to be refilled? (please list name of each medication and dose if known) metoprolol succinate (TOPROL XL) 25 MG 24 hr tablet  2. Which pharmacy/location (including street and city if local pharmacy) is medication to be sent to? [place], [place]  3. Do they need a 30 day or 90 day supply? [place]

## 2024-01-10 NOTE — Telephone Encounter (Signed)
 Refills has been sent to the pharmacy.

## 2024-01-16 ENCOUNTER — Telehealth: Payer: Self-pay | Admitting: Internal Medicine

## 2024-01-16 NOTE — Telephone Encounter (Signed)
 Forward to Dr.Ross for review

## 2024-01-16 NOTE — Telephone Encounter (Signed)
 Pt wants to know if her heart condition is considered a Chronic Condition. Only people with Chronic conditions can received the extra benefits she currently gets in 2026. Please advise.

## 2024-01-17 ENCOUNTER — Other Ambulatory Visit (HOSPITAL_COMMUNITY): Payer: Self-pay | Admitting: Internal Medicine

## 2024-01-17 DIAGNOSIS — M810 Age-related osteoporosis without current pathological fracture: Secondary | ICD-10-CM

## 2024-01-17 NOTE — Telephone Encounter (Signed)
 SHe is s/p MV repair   The valve  will need to get followed over time in clinic   Not sure if that counts enough as a chronic condition Can try

## 2024-01-18 NOTE — Telephone Encounter (Signed)
 Per pt's DPR- can leave detailed message on patients phone. Provided providers comment and advised for any questions or concerns, patient can call office at [phone] during clinic hours M-F from 8-5.

## 2024-01-23 ENCOUNTER — Ambulatory Visit (HOSPITAL_COMMUNITY)
Admission: RE | Admit: 2024-01-23 | Discharge: 2024-01-23 | Disposition: A | Discharge status: Home / Self Care | Source: Ambulatory Visit | Attending: Internal Medicine | Admitting: Internal Medicine

## 2024-01-23 DIAGNOSIS — M810 Age-related osteoporosis without current pathological fracture: Secondary | ICD-10-CM | POA: Insufficient documentation

## 2024-03-04 ENCOUNTER — Other Ambulatory Visit: Payer: Self-pay | Admitting: Gastroenterology

## 2024-03-04 DIAGNOSIS — K219 Gastro-esophageal reflux disease without esophagitis: Secondary | ICD-10-CM

## 2024-03-08 ENCOUNTER — Other Ambulatory Visit: Payer: Self-pay | Admitting: Gastroenterology

## 2024-03-08 DIAGNOSIS — K219 Gastro-esophageal reflux disease without esophagitis: Secondary | ICD-10-CM

## 2024-03-09 ENCOUNTER — Telehealth: Payer: Self-pay

## 2024-03-09 NOTE — Telephone Encounter (Signed)
 Patient called to let us  know she is out of medication but knows she has refills on file patient was made aware that a call would be made to pharmacy to see what can be done, I called and spoke with lourie at the [facility] pharmacy and pharmacist stated that they have some in stock and could probably have it ready within the next hour, patient was called and made aware

## 2024-06-26 ENCOUNTER — Ambulatory Visit

## 2024-06-28 ENCOUNTER — Ambulatory Visit

## 2024-11-09 ENCOUNTER — Ambulatory Visit: Admitting: Urology
